# Patient Record
Sex: Female | Born: 1955
Health system: Southern US, Community
[De-identification: ages and names within clinical notes are randomized; demographics above are authoritative.]

## PROBLEM LIST (undated history)

## (undated) DIAGNOSIS — D531 Other megaloblastic anemias, not elsewhere classified: Secondary | ICD-10-CM

## (undated) DIAGNOSIS — E079 Disorder of thyroid, unspecified: Secondary | ICD-10-CM

## (undated) DIAGNOSIS — M109 Gout, unspecified: Secondary | ICD-10-CM

## (undated) DIAGNOSIS — I8390 Asymptomatic varicose veins of unspecified lower extremity: Secondary | ICD-10-CM

## (undated) DIAGNOSIS — E785 Hyperlipidemia, unspecified: Secondary | ICD-10-CM

## (undated) DIAGNOSIS — R112 Nausea with vomiting, unspecified: Secondary | ICD-10-CM

## (undated) DIAGNOSIS — I10 Essential (primary) hypertension: Secondary | ICD-10-CM

## (undated) DIAGNOSIS — E039 Hypothyroidism, unspecified: Secondary | ICD-10-CM

## (undated) DIAGNOSIS — N189 Chronic kidney disease, unspecified: Secondary | ICD-10-CM

## (undated) DIAGNOSIS — Z9889 Other specified postprocedural states: Secondary | ICD-10-CM

## (undated) DIAGNOSIS — E669 Obesity, unspecified: Secondary | ICD-10-CM

## (undated) HISTORY — PX: OTHER SURGICAL HISTORY: SHX169

## (undated) HISTORY — PX: TONSILLECTOMY: SUR1361

## (undated) HISTORY — DX: Essential (primary) hypertension: I10

## (undated) HISTORY — DX: Gout, unspecified: M10.9

## (undated) HISTORY — DX: Disorder of thyroid, unspecified: E07.9

## (undated) HISTORY — PX: ABLATION ON ENDOMETRIOSIS: SHX5787

## (undated) HISTORY — DX: Asymptomatic varicose veins of unspecified lower extremity: I83.90

## (undated) HISTORY — DX: Obesity, unspecified: E66.9

## (undated) HISTORY — DX: Other megaloblastic anemias, not elsewhere classified: D53.1

## (undated) HISTORY — DX: Hyperlipidemia, unspecified: E78.5

## (undated) HISTORY — PX: FOOT SURGERY: SHX648

## (undated) HISTORY — DX: Chronic kidney disease, unspecified: N18.9

---

## 2000-04-01 ENCOUNTER — Encounter: Payer: Self-pay | Admitting: Gynecology

## 2000-04-01 ENCOUNTER — Encounter: Admission: RE | Admit: 2000-04-01 | Discharge: 2000-04-01 | Payer: Self-pay | Admitting: Gynecology

## 2001-03-31 ENCOUNTER — Other Ambulatory Visit: Admission: RE | Admit: 2001-03-31 | Discharge: 2001-03-31 | Payer: Self-pay | Admitting: Gynecology

## 2001-04-07 ENCOUNTER — Encounter: Payer: Self-pay | Admitting: Gynecology

## 2001-04-07 ENCOUNTER — Encounter: Admission: RE | Admit: 2001-04-07 | Discharge: 2001-04-07 | Payer: Self-pay | Admitting: Gynecology

## 2002-04-02 ENCOUNTER — Other Ambulatory Visit: Admission: RE | Admit: 2002-04-02 | Discharge: 2002-04-02 | Payer: Self-pay | Admitting: Gynecology

## 2002-04-13 ENCOUNTER — Encounter: Admission: RE | Admit: 2002-04-13 | Discharge: 2002-04-13 | Payer: Self-pay | Admitting: Gynecology

## 2002-04-13 ENCOUNTER — Encounter: Payer: Self-pay | Admitting: Gynecology

## 2003-04-06 ENCOUNTER — Other Ambulatory Visit: Admission: RE | Admit: 2003-04-06 | Discharge: 2003-04-06 | Payer: Self-pay | Admitting: Gynecology

## 2003-04-27 ENCOUNTER — Encounter: Admission: RE | Admit: 2003-04-27 | Discharge: 2003-04-27 | Payer: Self-pay | Admitting: Gynecology

## 2003-04-27 ENCOUNTER — Encounter: Payer: Self-pay | Admitting: Gynecology

## 2004-01-26 ENCOUNTER — Encounter: Admission: RE | Admit: 2004-01-26 | Discharge: 2004-01-26 | Payer: Self-pay | Admitting: Internal Medicine

## 2004-05-16 ENCOUNTER — Encounter: Admission: RE | Admit: 2004-05-16 | Discharge: 2004-05-16 | Payer: Self-pay | Admitting: Gynecology

## 2004-07-31 ENCOUNTER — Other Ambulatory Visit: Admission: RE | Admit: 2004-07-31 | Discharge: 2004-07-31 | Payer: Self-pay | Admitting: Gynecology

## 2005-12-18 ENCOUNTER — Encounter: Admission: RE | Admit: 2005-12-18 | Discharge: 2005-12-18 | Payer: Self-pay | Admitting: Gynecology

## 2006-12-20 ENCOUNTER — Encounter: Admission: RE | Admit: 2006-12-20 | Discharge: 2006-12-20 | Payer: Self-pay | Admitting: Obstetrics and Gynecology

## 2007-12-22 ENCOUNTER — Encounter: Admission: RE | Admit: 2007-12-22 | Discharge: 2007-12-22 | Payer: Self-pay | Admitting: Obstetrics and Gynecology

## 2007-12-31 ENCOUNTER — Encounter: Admission: RE | Admit: 2007-12-31 | Discharge: 2007-12-31 | Payer: Self-pay | Admitting: Obstetrics and Gynecology

## 2008-06-15 ENCOUNTER — Encounter: Admission: RE | Admit: 2008-06-15 | Discharge: 2008-06-15 | Payer: Self-pay | Admitting: Obstetrics and Gynecology

## 2008-06-25 ENCOUNTER — Ambulatory Visit (HOSPITAL_COMMUNITY): Admission: RE | Admit: 2008-06-25 | Discharge: 2008-06-25 | Payer: Self-pay | Admitting: Obstetrics and Gynecology

## 2008-12-31 ENCOUNTER — Encounter: Admission: RE | Admit: 2008-12-31 | Discharge: 2008-12-31 | Payer: Self-pay | Admitting: Obstetrics and Gynecology

## 2010-01-02 ENCOUNTER — Encounter: Admission: RE | Admit: 2010-01-02 | Discharge: 2010-01-02 | Payer: Self-pay | Admitting: Obstetrics and Gynecology

## 2010-11-27 ENCOUNTER — Other Ambulatory Visit: Payer: Self-pay | Admitting: Obstetrics and Gynecology

## 2010-11-27 DIAGNOSIS — Z1231 Encounter for screening mammogram for malignant neoplasm of breast: Secondary | ICD-10-CM

## 2011-01-04 ENCOUNTER — Ambulatory Visit
Admission: RE | Admit: 2011-01-04 | Discharge: 2011-01-04 | Disposition: A | Discharge status: Home / Self Care | Payer: Self-pay | Source: Ambulatory Visit | Attending: Obstetrics and Gynecology | Admitting: Obstetrics and Gynecology

## 2011-01-04 DIAGNOSIS — Z1231 Encounter for screening mammogram for malignant neoplasm of breast: Secondary | ICD-10-CM

## 2011-02-20 NOTE — H&P (Signed)
NAME:  Karen Mills, Karen Mills NO.:  [id number]   MEDICAL RECORD NO.:  [id number]          PATIENT TYPE:  AMB   LOCATION:  [place]                           FACILITY:  [facility]   PHYSICIAN:  Paula Compton, M.D. DATE OF BIRTH:  [birth year]   DATE OF ADMISSION:  DATE OF DISCHARGE:                              HISTORY & PHYSICAL   The patient is a 55 year old G1, P1 who is coming in for a scheduled  hysteroscopy Novasure endometrial ablation, given an ongoing problem  with abnormal uterine bleeding and menorrhagia.  The patient has had  significant heavy periods over the last 6-8 months, at times of  bleeding, heavy for 14 days straight.  She has had an endometrial biopsy  performed which was normal in August of 2009 and a saline infusion  ultrasound which demonstrated a normal uterine cavity with an intramural  fibroids noted.  She was given many options to deal with the bleeding  and elects to have a Novasure endometrial ablation, as she is surely  close to menopause and feels this would be the best way to deal with her  problem.   PAST MEDICAL HISTORY:  Chronic hypertension.   PAST SURGICAL HISTORY:  Foot surgery.   PAST OBSTETRICAL HISTORY:  One vaginal delivery.   PAST GYNECOLOGIC HISTORY:  No abnormal Pap smears.   FAMILY HISTORY:  She has no breast cancer or colon cancer.   CURRENT MEDICATIONS:  1. Levoxyl thyroid.  2. Benicar.  3. Ziac.  4. Lasix.   ALLERGIES:  No known drug allergies.   PHYSICAL EXAMINATION:  VITAL SIGNS:  Her blood pressure is 130/89.  CARDIAC:  Regular rate and rhythm.  LUNGS:  Clear.  ABDOMEN:  Soft and nontender.  PELVIC:  Normal external genitalia noted.  Cervix has no lesions.  Uterus is normal in size and on ultrasound demonstrates a small fibroid  approximately 2-3 cm  The ovaries are also normal, and the uterine  cavity itself is normal on ultrasound.  The patient was counseled as to  her possible options and was given options of  medical therapy, Lenda Kelp  and a Novasure ablation.  Given and her heavy cycles, she wishes to  proceed with the Novasure  ablation.  We discussed the risks and benefits in detail including  bleeding and uterine perforation.  She understands these risks.  She  will also use Cytotec prior to the procedure to decrease the risk of  perforation, and she wishes to proceed as stated.      Paula Compton, M.D.  Electronically Signed     KR/MEDQ  D:  06/24/2008  T:  06/24/2008  Job:  [id number]

## 2011-02-20 NOTE — Op Note (Signed)
NAME:  Karen Mills, Karen Mills NO.:  [id number]   MEDICAL RECORD NO.:  [id number]          PATIENT TYPE:  AMB   LOCATION:  [facility]                           FACILITY:  [facility]   PHYSICIAN:  Paula Compton, M.D. DATE OF BIRTH:  [birth year]   DATE OF PROCEDURE:  DATE OF DISCHARGE:                               OPERATIVE REPORT   PREOPERATIVE DIAGNOSES:  1. Menorrhagia.  2. Small fibroid uterus.   POSTOPERATIVE DIAGNOSES:  1. Menorrhagia.  2. Small fibroid uterus.   PROCEDURES:  Hysteroscopy and NovaSure ablation.   SURGEON:  Paula Compton, MD   ANESTHESIA:  [facility] with a 1% paracervical lidocaine block.   There is a normal cavity noted with no defects or polyps or fibroids.   ESTIMATED BLOOD LOSS:  Minimal.   URINE OUTPUT:  50 mL straight cath prior to procedure.   IV FLUIDS:  1200 LR.   HYSTEROSCOPIC DEFICIT:  100 mL.   COMPLICATIONS:  None.   PROCEDURE IN DETAIL:  The patient was taken to the operating room, where  [facility] anesthesia was obtained without difficulty.  She was then prepped  and draped in normal sterile fashion in the dorsal lithotomy position.  A speculum was placed within the patient's vagina, the cervix was  grasped with single-tooth tenaculum, and a paracervical block was placed  at 2 and 10 o'clock with 10 mL of 1% lidocaine placed at each location.  The uterus sounded to approximately 9 cm, the cervix was 4-cm long, and  the hysteroscope was then introduced into the cavity.  It was inspected  with a normal cavity shape, sound, and no intracavitary polyps or  fibroids noted.  At this point, the hysteroscope was removed, and the  NovaSure unit placed within the uterine cavity with a cavity depth of 5  set.  It was opened to a cavity width of 4 and test phase performed and  passed.  A treatment cycle of 1 minute 37 seconds then ensued and the  patient did quite well.  The NovaSure was then removed and the  hysteroscope reintroduced, and  a good  blanching noted throughout the cavity with no viable endometrium  visible.  At this point, all instruments and sponges were removed from  the vagina.  The tenaculum site was inspected and found to be  hemostatic.  Therefore, the patient was awakened and taken to the  recovery room in stable condition.      Paula Compton, M.D.  Electronically Signed     KR/MEDQ  D:  06/25/2008  T:  06/26/2008  Job:  [id number]

## 2011-07-09 LAB — CBC
MCHC: 34.1
Platelets: 340
RDW: 13.4

## 2011-07-09 LAB — BASIC METABOLIC PANEL
BUN: 10
CO2: 28
Chloride: 99
Creatinine, Ser: 0.72
Potassium: 3.7

## 2011-11-26 ENCOUNTER — Other Ambulatory Visit: Payer: Self-pay | Admitting: Obstetrics and Gynecology

## 2011-11-26 DIAGNOSIS — Z1231 Encounter for screening mammogram for malignant neoplasm of breast: Secondary | ICD-10-CM

## 2012-01-07 ENCOUNTER — Ambulatory Visit
Admission: RE | Admit: 2012-01-07 | Discharge: 2012-01-07 | Disposition: A | Discharge status: Home / Self Care | Payer: 59 | Source: Ambulatory Visit | Attending: Obstetrics and Gynecology | Admitting: Obstetrics and Gynecology

## 2012-01-07 DIAGNOSIS — Z1231 Encounter for screening mammogram for malignant neoplasm of breast: Secondary | ICD-10-CM

## 2012-12-02 ENCOUNTER — Other Ambulatory Visit: Payer: Self-pay | Admitting: Obstetrics and Gynecology

## 2012-12-02 DIAGNOSIS — Z1231 Encounter for screening mammogram for malignant neoplasm of breast: Secondary | ICD-10-CM

## 2013-01-07 ENCOUNTER — Ambulatory Visit
Admission: RE | Admit: 2013-01-07 | Discharge: 2013-01-07 | Disposition: A | Discharge status: Home / Self Care | Payer: 59 | Source: Ambulatory Visit | Attending: Obstetrics and Gynecology | Admitting: Obstetrics and Gynecology

## 2013-01-07 ENCOUNTER — Other Ambulatory Visit: Payer: Self-pay | Admitting: Obstetrics and Gynecology

## 2013-01-07 DIAGNOSIS — R928 Other abnormal and inconclusive findings on diagnostic imaging of breast: Secondary | ICD-10-CM

## 2013-01-07 DIAGNOSIS — Z1231 Encounter for screening mammogram for malignant neoplasm of breast: Secondary | ICD-10-CM

## 2013-01-20 ENCOUNTER — Ambulatory Visit
Admission: RE | Admit: 2013-01-20 | Discharge: 2013-01-20 | Disposition: A | Discharge status: Home / Self Care | Payer: 59 | Source: Ambulatory Visit | Attending: Obstetrics and Gynecology | Admitting: Obstetrics and Gynecology

## 2013-01-20 DIAGNOSIS — R928 Other abnormal and inconclusive findings on diagnostic imaging of breast: Secondary | ICD-10-CM

## 2013-12-14 ENCOUNTER — Other Ambulatory Visit: Payer: Self-pay | Admitting: Obstetrics and Gynecology

## 2013-12-14 ENCOUNTER — Other Ambulatory Visit: Payer: Self-pay

## 2013-12-14 DIAGNOSIS — Z1231 Encounter for screening mammogram for malignant neoplasm of breast: Secondary | ICD-10-CM

## 2013-12-14 DIAGNOSIS — Z780 Asymptomatic menopausal state: Secondary | ICD-10-CM

## 2013-12-15 ENCOUNTER — Encounter: Payer: Self-pay | Admitting: Vascular Surgery

## 2013-12-15 ENCOUNTER — Other Ambulatory Visit: Payer: Self-pay | Admitting: *Deleted

## 2013-12-15 DIAGNOSIS — I83893 Varicose veins of bilateral lower extremities with other complications: Secondary | ICD-10-CM

## 2013-12-18 ENCOUNTER — Encounter: Payer: Self-pay | Admitting: Vascular Surgery

## 2013-12-21 ENCOUNTER — Ambulatory Visit (INDEPENDENT_AMBULATORY_CARE_PROVIDER_SITE_OTHER): Payer: 59 | Admitting: Vascular Surgery

## 2013-12-21 ENCOUNTER — Encounter: Payer: Self-pay | Admitting: Vascular Surgery

## 2013-12-21 ENCOUNTER — Ambulatory Visit (HOSPITAL_COMMUNITY)
Admission: RE | Admit: 2013-12-21 | Discharge: 2013-12-21 | Disposition: A | Discharge status: Home / Self Care | Payer: 59 | Source: Ambulatory Visit | Attending: Vascular Surgery | Admitting: Vascular Surgery

## 2013-12-21 VITALS — BP 198/85 | HR 56 | Resp 18 | Ht 68.0 in | Wt 226.0 lb

## 2013-12-21 DIAGNOSIS — I83893 Varicose veins of bilateral lower extremities with other complications: Secondary | ICD-10-CM

## 2013-12-21 NOTE — Progress Notes (Signed)
Subjective:     Patient ID: Karen Mills, female   DOB: 03-31-1956, 58 y.o.   MRN: [id number]  HPI this 58 year old female referred by Dr. Harrington Challenger because of recurrent bleeding from reticular veins in the right lower extremity. This patient has had prominent bluish discoloration particularly in the lateral right lower leg area proximal to the ankle and she has had 2 recent episodes of spontaneous bleeding from this area which required compression to resolve. She also has had some painful varicosities in the right medial calf and thigh area which slowly enlarged. She does not wear elastic compression stockings nor elevate her legs or regular basis she has noticed some mild edema in the right ankle recently. She has no history of DVT or superficial thrombophlebitis.  Past Medical History  Diagnosis Date  . Varicose veins   . Hypertension   . Thyroid disease   . Hyperlipidemia     History  Substance Use Topics  . Smoking status: Never Smoker   . Smokeless tobacco: Never Used  . Alcohol Use: Yes    No family history on file.  Allergies  Allergen Reactions  . Accupril [Quinapril Hcl]     cough  . Amlodipine Besylate     swelling  . Atacand [Candesartan]   . Cozaar [Losartan Potassium]     Stomach upset   . Maxzide [Hydrochlorothiazide W-Triamterene]   . Tiazac [Diltiazem Hcl Er]   . Zestril [Lisinopril]     Cough     Current outpatient prescriptions:Biotin (BIOTIN 5000) 5 MG CAPS, Take by mouth daily. 2 capsules once a day., Disp: , Rfl: ;  Cyanocobalamin (VITAMIN B 12 PO), Take 100 mcg by mouth daily., Disp: , Rfl: ;  furosemide (LASIX) 20 MG tablet, Take 20 mg by mouth daily., Disp: , Rfl: ;  levothyroxine (SYNTHROID, LEVOTHROID) 125 MCG tablet, Take 125 mcg by mouth daily before breakfast., Disp: , Rfl:  olmesartan (BENICAR) 40 MG tablet, Take 40 mg by mouth daily., Disp: , Rfl: ;  Omega-3 Fatty Acids (FISH OIL BURP-LESS) 1200 MG CAPS, Take by mouth 3 (three) times daily., Disp:  , Rfl: ;  Plant Sterol Stanol-Pantethine (CHOLEST OFF COMPLETE) 450-75 MG TABS, Take by mouth., Disp: , Rfl: ;  vitamin C (ASCORBIC ACID) 500 MG tablet, Take 500 mg by mouth daily. 2 tablets daily., Disp: , Rfl:  vitamin E 400 UNIT capsule, Take 400 Units by mouth 2 (two) times daily., Disp: , Rfl: ;  zinc gluconate 50 MG tablet, Take 50 mg by mouth daily., Disp: , Rfl:   BP 198/85  Pulse 56  Resp 18  Ht 5\' 8"  (1.727 m)  Wt 226 lb (102.513 kg)  BMI 34.37 kg/m2  Body mass index is 34.37 kg/(m^2).          Review of Systems denies chest pain, dyspnea on exertion, PND, orthopnea, wheezing, hemoptysis, claudication. Does have a history of some bleeding in the past-all other systems negative and a complete review of systems     Objective:   Physical Exam BP 198/85  Pulse 56  Resp 18  Ht 5\' 8"  (1.727 m)  Wt 226 lb (102.513 kg)  BMI 34.37 kg/m2  Gen.-alert and oriented x3 in no apparent distress HEENT normal for age Lungs no rhonchi or wheezing Cardiovascular regular rhythm no murmurs carotid pulses 3+ palpable no bruits audible Abdomen soft nontender no palpable masses Musculoskeletal free of  major deformities Skin clear -no rashes Neurologic normal Lower extremities 3+ femoral and dorsalis pedis  pulses palpable bilaterally with no edema  Left leg 1+ edema right ankle Right leg has bulging varicosities in the distal medial thigh and medial calf extending around to the knee. Also has a 4 x 4 centimeter patch of reticular and spider veins proximal to the right lateral malleolus. There is no active bleeding or ulceration at this point but this is the location where the bleeding occurred on 2 occasions. No hyperpigmentation.  Today I ordered a venous duplex exam of the right leg which are reviewed and interpreted. There is incompetence of the valves in the right deep venous system throughout but no superficial reflux in the saphenous system is noted and there is no DVT      Assessment:     Recurrent bleeding from superficial veins-reticular-right lower extremity with bulging painful varicosities and lower thigh and calf with deep venous reflux    Plan:     Patient needs stab phlebectomy of varicosities in the distal thigh and calf right leg and sclerotherapy at a bleeding site of reticular veins because of recurrent bleeding from this site Will proceed with precertification to perform this in the very near future

## 2014-01-07 ENCOUNTER — Encounter: Payer: 59 | Admitting: Vascular Surgery

## 2014-01-07 ENCOUNTER — Encounter (HOSPITAL_COMMUNITY): Payer: 59

## 2014-01-08 ENCOUNTER — Ambulatory Visit: Payer: 59

## 2014-01-11 ENCOUNTER — Other Ambulatory Visit: Payer: Self-pay | Admitting: Family Medicine

## 2014-01-11 DIAGNOSIS — Z780 Asymptomatic menopausal state: Secondary | ICD-10-CM

## 2014-01-14 ENCOUNTER — Ambulatory Visit
Admission: RE | Admit: 2014-01-14 | Discharge: 2014-01-14 | Disposition: A | Discharge status: Home / Self Care | Payer: 59 | Source: Ambulatory Visit

## 2014-01-14 ENCOUNTER — Ambulatory Visit
Admission: RE | Admit: 2014-01-14 | Discharge: 2014-01-14 | Disposition: A | Discharge status: Home / Self Care | Payer: 59 | Source: Ambulatory Visit | Attending: Obstetrics and Gynecology | Admitting: Obstetrics and Gynecology

## 2014-01-14 DIAGNOSIS — Z780 Asymptomatic menopausal state: Secondary | ICD-10-CM

## 2014-01-14 DIAGNOSIS — Z1231 Encounter for screening mammogram for malignant neoplasm of breast: Secondary | ICD-10-CM

## 2014-02-01 ENCOUNTER — Other Ambulatory Visit: Payer: 59 | Admitting: Vascular Surgery

## 2014-02-12 ENCOUNTER — Encounter: Payer: Self-pay | Admitting: Vascular Surgery

## 2014-02-15 ENCOUNTER — Encounter: Payer: Self-pay | Admitting: Vascular Surgery

## 2014-02-15 ENCOUNTER — Other Ambulatory Visit: Payer: 59 | Admitting: Vascular Surgery

## 2014-02-15 ENCOUNTER — Ambulatory Visit (INDEPENDENT_AMBULATORY_CARE_PROVIDER_SITE_OTHER): Payer: 59 | Admitting: Vascular Surgery

## 2014-02-15 VITALS — BP 205/111 | HR 63 | Resp 16 | Ht 68.5 in | Wt 220.0 lb

## 2014-02-15 DIAGNOSIS — I83893 Varicose veins of bilateral lower extremities with other complications: Secondary | ICD-10-CM

## 2014-02-15 NOTE — Progress Notes (Signed)
   Laser Ablation Procedure      Date: 02/15/2014    SHANELLE MAKOWSKI DOB:05/28/1956  Consent signed: Yes  Surgeon:J.D. Kellie Simmering  Procedure:Stab Phlebectomies and Sclerotherapyright leg  BP 205/111  Pulse 63  Resp 16  Ht 5' 8.5" (1.74 m)  Wt 220 lb (99.791 kg)  BMI 32.96 kg/m2  Start time: 1:00   End time: 2:25  Tumescent Anesthesia: 100 cc 0.9% NaCl with 50 cc Lidocaine HCL with 1% Epi and 15 cc 8.4% NaHCO3  Local Anesthesia: 3 cc Lidocaine HCL and NaHCO3 (ratio 2:1)     Sclerotherapy: .3% %Sotradecol. Patient received a total of 12 cc foam with CO2  Stab Phlebectomy: 10-20 Sites: Thigh and Calf  Patient tolerated procedure well: Yes  Notes:   Description of Procedure:    The patient was  put into Trendelenburg position.  Local anesthetic was utilized overlying the marked varicosities.  Ten to 20stab wounds were made using the tip of an 11 blade; and using the vein hook,  The phlebectomies were performed using a hemostat to avulse these varicosities.  Adequate hemostasis was achieved, and steri strips were applied to the stab wound.    Sclerotherapy was performed to spider veins using 12  cc .3% Sotradecol foam via a 27g butterfly needle.  ABD pads and thigh high compression stockings were applied.  Ace wrap bandages were applied over the phlebectomy sites..  Blood loss was less than 15 cc.  The patient ambulated out of the operating room having tolerated the procedure well.

## 2014-02-15 NOTE — Progress Notes (Signed)
Subjective:     Patient ID: Karen Mills, female   DOB: [birth year], 58 y.o.   MRN: [id number]  HPI this 58 year old female had multiple stab phlebectomy of painful varicosities in the right medial thigh and medial calf area performed under local tumescent anesthesia. She tolerated the procedure well. She also had sclerotherapy performed in areas the lower portion of the leg were bleeding his previously occurred.   Review of Systems     Objective:   Physical Exam BP 205/111  Pulse 63  Resp 16  Ht 5' 8.5" (1.74 m)  Wt 220 lb (99.791 kg)  BMI 32.96 kg/m2       Assessment:     Well-tolerated stab phlebectomy multiple varicosities right leg and sclerotherapy    Plan:     Return in 8 weeks for followup

## 2014-02-16 ENCOUNTER — Encounter: Payer: Self-pay | Admitting: Vascular Surgery

## 2014-02-16 ENCOUNTER — Telehealth: Payer: Self-pay | Admitting: *Deleted

## 2014-02-16 NOTE — Telephone Encounter (Signed)
Asked pt to call me if she is having any problems, questions or concerns.

## 2014-04-12 ENCOUNTER — Encounter: Payer: Self-pay | Admitting: Vascular Surgery

## 2014-04-13 ENCOUNTER — Encounter: Payer: Self-pay | Admitting: Vascular Surgery

## 2014-04-13 ENCOUNTER — Ambulatory Visit (INDEPENDENT_AMBULATORY_CARE_PROVIDER_SITE_OTHER): Payer: Self-pay | Admitting: Vascular Surgery

## 2014-04-13 ENCOUNTER — Encounter (HOSPITAL_COMMUNITY): Payer: 59

## 2014-04-13 VITALS — BP 184/81 | HR 64 | Resp 16 | Ht 68.0 in | Wt 210.0 lb

## 2014-04-13 DIAGNOSIS — I83893 Varicose veins of bilateral lower extremities with other complications: Secondary | ICD-10-CM

## 2014-04-13 NOTE — Progress Notes (Signed)
Subjective:     Patient ID: KURSTIN KOTLARZ, female   DOB: 05/10/1956, 58 y.o.   MRN: [id number]  HPI this 58 year old female returns for a week followup regarding multiple stab phlebectomy of painful varicosities in the right leg. She also had some sclerotherapy performed. In general she is very pleased with the result. She does have some mild chronic edema. She is no longer elastic compression stockings.   Review of Systems     Objective:   Physical Exam BP 184/81  Pulse 64  Resp 16  Ht 5\' 8"  (1.727 m)  Wt 210 lb (95.255 kg)  BMI 31.94 kg/m2   General well-developed well-nourished female no apparent stress alert oriented x3 Right leg with no obvious bulging varicosities remaining. Minimal distal edema with 3 posterior cells pedis pulse. There are spider veins in clusters from the medial thigh and calf area.     Assessment:     Good result following multiple stab phlebectomy of painful varicosities    Plan:     Patient may consider further sclerotherapy in the future otherwise return on when necessary basis

## 2014-12-13 ENCOUNTER — Other Ambulatory Visit: Payer: Self-pay

## 2014-12-13 DIAGNOSIS — Z1231 Encounter for screening mammogram for malignant neoplasm of breast: Secondary | ICD-10-CM

## 2015-01-17 ENCOUNTER — Encounter (INDEPENDENT_AMBULATORY_CARE_PROVIDER_SITE_OTHER): Payer: Self-pay

## 2015-01-17 ENCOUNTER — Ambulatory Visit
Admission: RE | Admit: 2015-01-17 | Discharge: 2015-01-17 | Disposition: A | Discharge status: Home / Self Care | Payer: 59 | Source: Ambulatory Visit

## 2015-01-17 DIAGNOSIS — Z1231 Encounter for screening mammogram for malignant neoplasm of breast: Secondary | ICD-10-CM

## 2015-04-03 ENCOUNTER — Emergency Department (HOSPITAL_BASED_OUTPATIENT_CLINIC_OR_DEPARTMENT_OTHER)
Admission: EM | Admit: 2015-04-03 | Discharge: 2015-04-03 | Disposition: A | Discharge status: Home / Self Care | Payer: 59 | Attending: Emergency Medicine | Admitting: Emergency Medicine

## 2015-04-03 ENCOUNTER — Encounter (HOSPITAL_BASED_OUTPATIENT_CLINIC_OR_DEPARTMENT_OTHER): Payer: Self-pay | Admitting: Emergency Medicine

## 2015-04-03 ENCOUNTER — Emergency Department (HOSPITAL_BASED_OUTPATIENT_CLINIC_OR_DEPARTMENT_OTHER): Payer: 59

## 2015-04-03 DIAGNOSIS — S9002XA Contusion of left ankle, initial encounter: Secondary | ICD-10-CM | POA: Diagnosis not present

## 2015-04-03 DIAGNOSIS — Y9389 Activity, other specified: Secondary | ICD-10-CM | POA: Diagnosis not present

## 2015-04-03 DIAGNOSIS — I10 Essential (primary) hypertension: Secondary | ICD-10-CM | POA: Insufficient documentation

## 2015-04-03 DIAGNOSIS — E785 Hyperlipidemia, unspecified: Secondary | ICD-10-CM | POA: Insufficient documentation

## 2015-04-03 DIAGNOSIS — E079 Disorder of thyroid, unspecified: Secondary | ICD-10-CM | POA: Diagnosis not present

## 2015-04-03 DIAGNOSIS — Y9289 Other specified places as the place of occurrence of the external cause: Secondary | ICD-10-CM | POA: Diagnosis not present

## 2015-04-03 DIAGNOSIS — W010XXA Fall on same level from slipping, tripping and stumbling without subsequent striking against object, initial encounter: Secondary | ICD-10-CM | POA: Diagnosis not present

## 2015-04-03 DIAGNOSIS — Y998 Other external cause status: Secondary | ICD-10-CM | POA: Insufficient documentation

## 2015-04-03 DIAGNOSIS — S8012XA Contusion of left lower leg, initial encounter: Secondary | ICD-10-CM

## 2015-04-03 DIAGNOSIS — Z79899 Other long term (current) drug therapy: Secondary | ICD-10-CM | POA: Diagnosis not present

## 2015-04-03 DIAGNOSIS — S8992XA Unspecified injury of left lower leg, initial encounter: Secondary | ICD-10-CM | POA: Diagnosis present

## 2015-04-03 DIAGNOSIS — W19XXXA Unspecified fall, initial encounter: Secondary | ICD-10-CM

## 2015-04-03 NOTE — Discharge Instructions (Signed)
Apply cool compresses for the next 24-48 hour then transitioned to warm compresses.  Please follow with your primary care doctor in the next 2 days for a check-up. They must obtain records for further management.   Do not hesitate to return to the Emergency Department for any new, worsening or concerning symptoms.    Contusion A contusion is a deep bruise. Contusions are the result of an injury that caused bleeding under the skin. The contusion may turn blue, purple, or yellow. Minor injuries will give you a painless contusion, but more severe contusions may stay painful and swollen for a few weeks.  CAUSES  A contusion is usually caused by a blow, trauma, or direct force to an area of the body. SYMPTOMS   Swelling and redness of the injured area.  Bruising of the injured area.  Tenderness and soreness of the injured area.  Pain. DIAGNOSIS  The diagnosis can be made by taking a history and physical exam. An X-ray, CT scan, or MRI may be needed to determine if there were any associated injuries, such as fractures. TREATMENT  Specific treatment will depend on what area of the body was injured. In general, the best treatment for a contusion is resting, icing, elevating, and applying cold compresses to the injured area. Over-the-counter medicines may also be recommended for pain control. Ask your caregiver what the best treatment is for your contusion. HOME CARE INSTRUCTIONS   Put ice on the injured area.  Put ice in a plastic bag.  Place a towel between your skin and the bag.  Leave the ice on for 15-20 minutes, 3-4 times a day, or as directed by your health care provider.  Only take over-the-counter or prescription medicines for pain, discomfort, or fever as directed by your caregiver. Your caregiver may recommend avoiding anti-inflammatory medicines (aspirin, ibuprofen, and naproxen) for 48 hours because these medicines may increase bruising.  Rest the injured area.  If possible,  elevate the injured area to reduce swelling. SEEK IMMEDIATE MEDICAL CARE IF:   You have increased bruising or swelling.  You have pain that is getting worse.  Your swelling or pain is not relieved with medicines. MAKE SURE YOU:   Understand these instructions.  Will watch your condition.  Will get help right away if you are not doing well or get worse. Document Released: 07/04/2005 Document Revised: 09/29/2013 Document Reviewed: 07/30/2011 [facility] Patient Information 2015 [place], [place]. This information is not intended to replace advice given to you by your health care provider. Make sure you discuss any questions you have with your health care provider.

## 2015-04-03 NOTE — ED Notes (Signed)
Pt in c/o swelling and bruising to L leg after tripping on a storm drain x 2 days ago, ambulatory to triage.

## 2015-04-03 NOTE — ED Provider Notes (Signed)
CSN: [id number]     Arrival date & time 04/03/15  1216 History   First MD Initiated Contact with Patient 04/03/15 1305     Chief Complaint  Patient presents with  . Leg Injury     (Consider location/radiation/quality/duration/timing/severity/associated sxs/prior Treatment) HPI   Blood pressure 182/78, pulse 68, temperature 98.2 F (36.8 C), temperature source Oral, resp. rate 18, SpO2 98 %.  Karen Mills is a 59 y.o. female complaining of contusion to the lateral left leg and ecchymoses to medial left ankle status post slip and fall 2 days ago. Patient tripped into storm drain when she was carrying groceries to her car. She has been ambulatory at home without issue. She took Advil on the day of the fall but has not taken any since and she rates her pain is minimal and describes as a stinking. There was no other trauma, no head trauma, cervicalgia, LOC, chest pain, abdominal pain, difficulty moving being major joints numbness or weakness.   Past Medical History  Diagnosis Date  . Varicose veins   . Hypertension   . Thyroid disease   . Hyperlipidemia    Past Surgical History  Procedure Laterality Date  . Foot surgery    . Tonsillectomy    . Ablation on endometriosis    . Vein removal     Family History  Problem Relation Age of Onset  . Heart disease Mother   . Heart disease Father   . Heart disease Brother    History  Substance Use Topics  . Smoking status: Never Smoker   . Smokeless tobacco: Never Used  . Alcohol Use: Yes   OB History    No data available     Review of Systems  10 systems reviewed and found to be negative, except as noted in the HPI.   Allergies  Accupril; Amlodipine besylate; Atacand; Cozaar; Maxzide; Tiazac; and Zestril  Home Medications   Prior to Admission medications   Medication Sig Start Date End Date Taking? Authorizing Provider  Biotin (BIOTIN 5000) 5 MG CAPS Take by mouth daily. 2 capsules once a day.    Historical Provider, MD   Cyanocobalamin (VITAMIN B 12 PO) Take 100 mcg by mouth daily.    Historical Provider, MD  furosemide (LASIX) 20 MG tablet Take 20 mg by mouth daily.    Historical Provider, MD  levothyroxine (SYNTHROID, LEVOTHROID) 125 MCG tablet Take 125 mcg by mouth daily before breakfast.    Historical Provider, MD  olmesartan (BENICAR) 40 MG tablet Take 40 mg by mouth daily.    Historical Provider, MD  Omega-3 Fatty Acids (FISH OIL BURP-LESS) 1200 MG CAPS Take by mouth 3 (three) times daily.    Historical Provider, MD  vitamin C (ASCORBIC ACID) 500 MG tablet Take 500 mg by mouth daily. 2 tablets daily.    Historical Provider, MD  vitamin E 400 UNIT capsule Take 400 Units by mouth 2 (two) times daily.    Historical Provider, MD  zinc gluconate 50 MG tablet Take 50 mg by mouth daily.    Historical Provider, MD   BP 182/78 mmHg  Pulse 68  Temp(Src) 98.2 F (36.8 C) (Oral)  Resp 18  SpO2 98% Physical Exam  Constitutional: She is oriented to person, place, and time. She appears well-developed and well-nourished. No distress.  HENT:  Head: Normocephalic.  Eyes: Conjunctivae and EOM are normal.  Cardiovascular: Normal rate and regular rhythm.   Pulmonary/Chest: Effort normal and breath sounds normal. No stridor.  Abdominal: Bowel sounds are normal.  Musculoskeletal: Normal range of motion. She exhibits edema and tenderness.  10 cm contusion to left lateral mid shin. Patient has ecchymoses to the inferior lateral left medial malleolus, dorsalis pedis is 2+, no tenderness to palpation along the bilateral malleoli. No overlying lacerations or abrasions, she does have a 1 cm fluid fluid-filled blister overlying the contusion.  No superficial collaterals, Homans sign is negative.  Neurological: She is alert and oriented to person, place, and time.  Psychiatric: She has a normal mood and affect.  Nursing note and vitals reviewed.   ED Course  Procedures (including critical care time) Labs Review Labs  Reviewed - No data to display  Imaging Review Dg Tibia/fibula Left  04/03/2015   CLINICAL DATA:  Pain, bruising, and swelling secondary to a fall 2 days ago.  EXAM: LEFT TIBIA AND FIBULA - 2 VIEW  COMPARISON:  None.  FINDINGS: There is no evidence of fracture or other focal bone lesions. Edema in the subcutaneous tissues at the anterior lateral aspect of the left lower leg.  IMPRESSION: Soft tissue contusion.  No acute osseous abnormality.   Electronically Signed   By: Lorriane Shire M.D.   On: 04/03/2015 12:54     EKG Interpretation None      MDM   Final diagnoses:  Contusion of leg, left, initial encounter  Fall, initial encounter    Filed Vitals:   04/03/15 1235  BP: 182/78  Pulse: 68  Temp: 98.2 F (36.8 C)  TempSrc: Oral  Resp: 18  SpO2: 98%    Karen Mills is a pleasant 59 y.o. female presenting with large contusion to lateral shin and ecchymoses to medial malleolus. No tenderness to palpation along the [place]. Patient is ambulatory with a nonantalgic gait. Advised patient to apply cold and then transition to warm compresses. Gave anticipatory guidance that the contusion will create a ecchymoses on the lower leg from gravity. Patient declines pain medication.  Evaluation does not show pathology that would require ongoing emergent intervention or inpatient treatment. Pt is hemodynamically stable and mentating appropriately. Discussed findings and plan with patient/guardian, who agrees with care plan. All questions answered. Return precautions discussed and outpatient follow up given.     Monico Blitz, PA-C 04/03/15 [place], MD 04/05/15 [phone]

## 2015-12-12 ENCOUNTER — Other Ambulatory Visit: Payer: Self-pay

## 2015-12-12 DIAGNOSIS — Z1231 Encounter for screening mammogram for malignant neoplasm of breast: Secondary | ICD-10-CM

## 2016-01-18 ENCOUNTER — Ambulatory Visit
Admission: RE | Admit: 2016-01-18 | Discharge: 2016-01-18 | Disposition: A | Discharge status: Home / Self Care | Payer: 59 | Source: Ambulatory Visit

## 2016-01-18 DIAGNOSIS — Z1231 Encounter for screening mammogram for malignant neoplasm of breast: Secondary | ICD-10-CM

## 2016-06-06 ENCOUNTER — Other Ambulatory Visit: Payer: Self-pay | Admitting: Family Medicine

## 2016-06-06 DIAGNOSIS — I158 Other secondary hypertension: Secondary | ICD-10-CM

## 2016-06-26 ENCOUNTER — Ambulatory Visit
Admission: RE | Admit: 2016-06-26 | Discharge: 2016-06-26 | Disposition: A | Discharge status: Home / Self Care | Payer: 59 | Source: Ambulatory Visit | Attending: Family Medicine | Admitting: Family Medicine

## 2016-06-26 DIAGNOSIS — I158 Other secondary hypertension: Secondary | ICD-10-CM

## 2016-06-26 MED ORDER — GADOBENATE DIMEGLUMINE 529 MG/ML IV SOLN
20.0000 mL | Freq: Once | INTRAVENOUS | Status: AC | PRN
  Administered 2016-06-26: 20 mL via INTRAVENOUS

## 2016-07-10 ENCOUNTER — Telehealth: Payer: Self-pay | Admitting: Cardiovascular Disease

## 2016-07-10 NOTE — Telephone Encounter (Signed)
Received records from [facility] for appointment on 07/17/16 with Dr Fletcher Anon.  Records given to [facility] (medical records) for Dr Tyrell Antonio schedule on 07/17/16. lp

## 2016-07-17 ENCOUNTER — Ambulatory Visit (INDEPENDENT_AMBULATORY_CARE_PROVIDER_SITE_OTHER): Payer: 59 | Admitting: Cardiovascular Disease

## 2016-07-17 VITALS — BP 130/80 | HR 62 | Ht 68.0 in | Wt 234.4 lb

## 2016-07-17 DIAGNOSIS — I701 Atherosclerosis of renal artery: Secondary | ICD-10-CM | POA: Diagnosis not present

## 2016-07-17 DIAGNOSIS — I10 Essential (primary) hypertension: Secondary | ICD-10-CM

## 2016-07-17 NOTE — Progress Notes (Signed)
Cardiology Office Note   Date:  07/17/2016   ID:  Karen Mills, Karen Mills 1956/03/24, MRN [id number]  PCP:   Melinda Crutch, MD  Cardiologist:   Kathlyn Sacramento, MD   Chief Complaint  Patient presents with  . New Patient (Initial Visit)      History of Present Illness: ROSAMAE Mills is a 60 y.o. female who was referred by Dr. Harrington Challenger for evaluation of hypertension and renal artery stenosis. The patient reports prolonged history of hypertension of at least 20 years of duration which has worsened recently. She was found to have systolic blood pressure in the 200 range. She was using Advil for pain but stopped taking all nonsteroidal anti-inflammatory medications. She saw Dr. Wynonia Lawman for a heart murmur and echocardiogram was overall unremarkable. He added spironolactone and since then and blood pressure has been well-controlled.  She has family history of hypertension. She is not a smoker and drinks alcohol occasionally. She denies any chest pain or shortness of breath. I reviewed her most recent labs in August which showed normal kidney function and electrolytes. Her potassium was 4.9. She had renal artery MRA which showed normal right renal artery. There was 2 renal arteries on the left side which showed somewhat beaded appearance of the superior left renal artery and mild stenosis affecting the inferior left renal artery.    Past Medical History:  Diagnosis Date  . Hyperlipidemia   . Hypertension   . Thyroid disease   . Varicose veins     Past Surgical History:  Procedure Laterality Date  . ABLATION ON ENDOMETRIOSIS    . FOOT SURGERY    . TONSILLECTOMY    . vein removal       Current Outpatient Prescriptions  Medication Sig Dispense Refill  . atorvastatin (LIPITOR) 20 MG tablet Take 20 mg by mouth daily.  4  . Biotin (BIOTIN 5000) 5 MG CAPS Take by mouth daily. 2 capsules once a day.    . bisoprolol-hydrochlorothiazide (ZIAC) 10-6.25 MG tablet Take 1 tablet by mouth daily.  4    . Cyanocobalamin (VITAMIN B 12 PO) Take 100 mcg by mouth daily.    . furosemide (LASIX) 20 MG tablet Take 20 mg by mouth daily.    . hydrALAZINE (APRESOLINE) 25 MG tablet Take 1 tablet by mouth 3 (three) times daily.  3  . levothyroxine (SYNTHROID, LEVOTHROID) 150 MCG tablet Take 1 tablet by mouth daily.  4  . olmesartan (BENICAR) 40 MG tablet Take 40 mg by mouth daily.    Marland Kitchen spironolactone (ALDACTONE) 25 MG tablet Take 25 mg by mouth daily.    . vitamin C (ASCORBIC ACID) 500 MG tablet Take 500 mg by mouth daily. 2 tablets daily.    . vitamin E 400 UNIT capsule Take 400 Units by mouth 2 (two) times daily.    Marland Kitchen zinc gluconate 50 MG tablet Take 50 mg by mouth daily.     No current facility-administered medications for this visit.     Allergies:   Accupril [quinapril hcl]; Amlodipine besylate; Atacand [candesartan]; Cozaar [losartan potassium]; Maxzide [hydrochlorothiazide w-triamterene]; Tiazac [diltiazem hcl er beads]; and Zestril [lisinopril]    Social History:  The patient  reports that she has never smoked. She has never used smokeless tobacco. She reports that she drinks alcohol. She reports that she does not use drugs.   Family History:  The patient's family history includes Heart disease in her brother, father, and mother.    ROS:  Please see  the history of present illness.   Otherwise, review of systems are positive for none.   All other systems are reviewed and negative.    PHYSICAL EXAM: VS:  BP 130/80   Pulse 62   Ht 5\' 8"  (1.727 m)   Wt 234 lb 6.4 oz (106.3 kg)   BMI 35.64 kg/m  , BMI Body mass index is 35.64 kg/m. GEN: Well nourished, well developed, in no acute distress  HEENT: normal  Neck: no JVD, carotid bruits, or masses Cardiac: RRR; no murmurs, rubs, or gallops,no edema  Respiratory:  clear to auscultation bilaterally, normal work of breathing GI: soft, nontender, nondistended, + BS MS: no deformity or atrophy  Skin: warm and dry, no rash Neuro:  Strength  and sensation are intact Psych: euthymic mood, full affect   EKG:  EKG is ordered today. The ekg ordered today demonstrates normal sinus rhythm with LVH.   Recent Labs: No results found for requested labs within last 8760 hours.    Lipid Panel No results found for: CHOL, TRIG, HDL, CHOLHDL, VLDL, LDLCALC, LDLDIRECT    Wt Readings from Last 3 Encounters:  07/17/16 234 lb 6.4 oz (106.3 kg)  04/13/14 210 lb (95.3 kg)  02/15/14 220 lb (99.8 kg)      No flowsheet data found.    ASSESSMENT AND PLAN:  1.  Essential hypertension: I suspect that her elevated blood pressure is likely due to essential hypertension and renal artery stenosis. Blood pressure is controlled now on current medications.  2. Renal artery stenosis: MRA was suggestive of possible stenosis involving one of the left renal arteries. The right renal artery was normal. I discussed the finding with the patient and explained to her that MRA tends to overestimate the severity of renal artery stenosis. The indications for renal artery revascularization are currently limited and are reserved for severe cases associated with recurrent heart failure or refractory hypertension. Her blood pressure has now been well-controlled after the addition of spironolactone. She uses hydralazine usually as needed if her blood pressure is elevated throughout the day. I'm going to follow her in 6 months and do a renal artery duplex at that time.    Disposition:   FU with me in 6 months  Signed,  Kathlyn Sacramento, MD  07/17/2016 8:49 AM    [place]

## 2016-07-17 NOTE — Patient Instructions (Signed)
Medication Instructions:  Your physician recommends that you continue on your current medications as directed. Please refer to the Current Medication list given to you today.  Labwork: No new orders.   Testing/Procedures: Your physician has requested that you have a renal artery duplex in 6 MONTHS. During this test, an ultrasound is used to evaluate blood flow to the kidneys. Allow one hour for this exam. Do not eat after midnight the day before and avoid carbonated beverages. Take your medications as you usually do.  Follow-Up: Your physician wants you to follow-up in: 6 MONTHS with Dr Fletcher Anon. You will receive a reminder letter in the mail two months in advance. If you don't receive a letter, please call our office to schedule the follow-up appointment.   Any Other Special Instructions Will Be Listed Below (If Applicable).     If you need a refill on your cardiac medications before your next appointment, please call your pharmacy.

## 2016-10-10 DIAGNOSIS — C44722 Squamous cell carcinoma of skin of right lower limb, including hip: Secondary | ICD-10-CM | POA: Diagnosis not present

## 2016-10-10 DIAGNOSIS — L570 Actinic keratosis: Secondary | ICD-10-CM | POA: Diagnosis not present

## 2016-10-10 DIAGNOSIS — C4492 Squamous cell carcinoma of skin, unspecified: Secondary | ICD-10-CM

## 2016-10-10 HISTORY — DX: Squamous cell carcinoma of skin, unspecified: C44.92

## 2016-10-29 NOTE — Addendum Note (Signed)
Addended by: Waylan Rocher on: 10/29/2016 11:46 AM   Modules accepted: Orders

## 2016-11-02 DIAGNOSIS — S335XXA Sprain of ligaments of lumbar spine, initial encounter: Secondary | ICD-10-CM | POA: Diagnosis not present

## 2016-11-02 DIAGNOSIS — M5136 Other intervertebral disc degeneration, lumbar region: Secondary | ICD-10-CM | POA: Diagnosis not present

## 2016-11-15 DIAGNOSIS — D0471 Carcinoma in situ of skin of right lower limb, including hip: Secondary | ICD-10-CM | POA: Diagnosis not present

## 2016-11-15 DIAGNOSIS — C44722 Squamous cell carcinoma of skin of right lower limb, including hip: Secondary | ICD-10-CM | POA: Diagnosis not present

## 2016-11-15 DIAGNOSIS — D099 Carcinoma in situ, unspecified: Secondary | ICD-10-CM

## 2016-11-15 HISTORY — DX: Carcinoma in situ, unspecified: D09.9

## 2016-12-06 ENCOUNTER — Other Ambulatory Visit: Payer: Self-pay | Admitting: Obstetrics and Gynecology

## 2016-12-06 DIAGNOSIS — Z1231 Encounter for screening mammogram for malignant neoplasm of breast: Secondary | ICD-10-CM

## 2017-01-16 ENCOUNTER — Encounter (HOSPITAL_COMMUNITY): Payer: Self-pay | Admitting: Cardiovascular Disease

## 2017-01-18 ENCOUNTER — Ambulatory Visit
Admission: RE | Admit: 2017-01-18 | Discharge: 2017-01-18 | Disposition: A | Discharge status: Home / Self Care | Payer: 59 | Source: Ambulatory Visit | Attending: Obstetrics and Gynecology | Admitting: Obstetrics and Gynecology

## 2017-01-18 DIAGNOSIS — Z1231 Encounter for screening mammogram for malignant neoplasm of breast: Secondary | ICD-10-CM

## 2017-01-29 DIAGNOSIS — I359 Nonrheumatic aortic valve disorder, unspecified: Secondary | ICD-10-CM | POA: Diagnosis not present

## 2017-01-29 DIAGNOSIS — I872 Venous insufficiency (chronic) (peripheral): Secondary | ICD-10-CM | POA: Diagnosis not present

## 2017-01-29 DIAGNOSIS — I119 Hypertensive heart disease without heart failure: Secondary | ICD-10-CM | POA: Diagnosis not present

## 2017-07-10 DIAGNOSIS — E782 Mixed hyperlipidemia: Secondary | ICD-10-CM | POA: Diagnosis not present

## 2017-07-10 DIAGNOSIS — Z0000 Encounter for general adult medical examination without abnormal findings: Secondary | ICD-10-CM | POA: Diagnosis not present

## 2017-07-10 DIAGNOSIS — I10 Essential (primary) hypertension: Secondary | ICD-10-CM | POA: Diagnosis not present

## 2017-07-10 DIAGNOSIS — E039 Hypothyroidism, unspecified: Secondary | ICD-10-CM | POA: Diagnosis not present

## 2017-09-20 ENCOUNTER — Ambulatory Visit: Payer: 59

## 2017-09-20 ENCOUNTER — Ambulatory Visit (INDEPENDENT_AMBULATORY_CARE_PROVIDER_SITE_OTHER): Payer: 59

## 2017-09-20 ENCOUNTER — Other Ambulatory Visit: Payer: Self-pay | Admitting: Podiatry

## 2017-09-20 ENCOUNTER — Ambulatory Visit (INDEPENDENT_AMBULATORY_CARE_PROVIDER_SITE_OTHER): Payer: 59 | Admitting: Podiatry

## 2017-09-20 ENCOUNTER — Encounter: Payer: Self-pay | Admitting: Podiatry

## 2017-09-20 VITALS — BP 137/68 | HR 72 | Resp 16

## 2017-09-20 DIAGNOSIS — M779 Enthesopathy, unspecified: Secondary | ICD-10-CM

## 2017-09-20 DIAGNOSIS — M79671 Pain in right foot: Secondary | ICD-10-CM

## 2017-09-20 DIAGNOSIS — M205X2 Other deformities of toe(s) (acquired), left foot: Secondary | ICD-10-CM

## 2017-09-20 DIAGNOSIS — M79672 Pain in left foot: Secondary | ICD-10-CM | POA: Diagnosis not present

## 2017-09-20 DIAGNOSIS — M1000 Idiopathic gout, unspecified site: Secondary | ICD-10-CM | POA: Diagnosis not present

## 2017-09-20 MED ORDER — TRIAMCINOLONE ACETONIDE 10 MG/ML IJ SUSP
10.0000 mg | Freq: Once | INTRAMUSCULAR | Status: AC
  Administered 2017-09-20: 10 mg

## 2017-09-20 NOTE — Progress Notes (Signed)
   Subjective:    Patient ID: Karen Mills, female    DOB: [birth year], 61 y.o.   MRN: [id number]  HPI    Review of Systems  All other systems reviewed and are negative.      Objective:   Physical Exam        Assessment & Plan:

## 2017-09-20 NOTE — Patient Instructions (Signed)

## 2017-09-20 NOTE — Progress Notes (Signed)
Dg foot  

## 2017-09-21 NOTE — Progress Notes (Signed)
Subjective:   Patient ID: Karen Mills, female   DOB: 61 y.o.   MRN: [id number]   HPI Patient presents stating I have had an acute inflammation of my big toe joint left and I am not sure what may have happened.  I had surgery on this years ago and started well but is been very painful and making it hard for me to walk   Review of Systems  All other systems reviewed and are negative.       Objective:  Physical Exam  Constitutional: She appears well-developed and well-nourished.  Cardiovascular: Intact distal pulses.  Pulmonary/Chest: Effort normal.  Musculoskeletal: Normal range of motion.  Neurological: She is alert.  Skin: Skin is warm.  Nursing note and vitals reviewed.   Neurovascular status intact muscle strength adequate range of motion within normal limits with patient found to have inflammation and fluid around the first MPJ left with pain within the joint surface and reduced motion.  Previous surgery was done and there is no dorsal spurring noted at the current time and patient was found to have good digital perfusion and is well oriented x3 and states she does not smoke currently and would like to be more active     Assessment:  Inflammatory capsulitis of the first MPJ left with probable hallux limitus and arthritis present     Plan:  Reviewed condition and recommended that we treat this as an acute condition and I did discuss the differences between acute inflammation capsulitis versus the possibilities for gout.  I gave her instructions on gout reviewed her x-rays and injected the first MPJ 3 mg Kenalog 5 mg Xylocaine  X-rays indicate that there is quite a bit of spurring and narrowing of the joint around the first MPJ left with pins in place in the right looks healthy with no indications of pathology

## 2017-10-18 ENCOUNTER — Ambulatory Visit (INDEPENDENT_AMBULATORY_CARE_PROVIDER_SITE_OTHER): Payer: 59 | Admitting: Podiatry

## 2017-10-18 ENCOUNTER — Encounter: Payer: Self-pay | Admitting: Podiatry

## 2017-10-18 DIAGNOSIS — M779 Enthesopathy, unspecified: Secondary | ICD-10-CM

## 2017-10-18 DIAGNOSIS — M205X2 Other deformities of toe(s) (acquired), left foot: Secondary | ICD-10-CM

## 2017-10-18 NOTE — Progress Notes (Signed)
Subjective:   Patient ID: Karen Mills, female   DOB: 63 y.o.   MRN: [id number]   HPI Patient presents stating that the joint is doing much better but there is still some discomfort underneath the callus on the second metatarsal   ROS      Objective:  Physical Exam  Neurovascular status intact with diminished inflammation of the first MPJ left with moderate hallux limitus deformity and crepitus formation of the joint surface. Patient does have callus sub-second left     Assessment:  Improvement of hallux limitus with possibility for gout left and also possibility for inflammatory capsulitis second MPJ or plantar callus format     Plan:  H&P and condition reviewed. At this point for the left I recommended padding to offload second metatarsal and patient will be seen back if symptoms were to persist or if any issues with the first metatarsal should occur again

## 2017-11-27 DIAGNOSIS — R6889 Other general symptoms and signs: Secondary | ICD-10-CM | POA: Diagnosis not present

## 2017-11-27 DIAGNOSIS — R05 Cough: Secondary | ICD-10-CM | POA: Diagnosis not present

## 2017-12-10 ENCOUNTER — Other Ambulatory Visit: Payer: Self-pay | Admitting: Obstetrics and Gynecology

## 2017-12-10 DIAGNOSIS — Z1231 Encounter for screening mammogram for malignant neoplasm of breast: Secondary | ICD-10-CM

## 2018-01-20 ENCOUNTER — Ambulatory Visit: Payer: 59

## 2018-01-20 ENCOUNTER — Ambulatory Visit
Admission: RE | Admit: 2018-01-20 | Discharge: 2018-01-20 | Disposition: A | Discharge status: Home / Self Care | Payer: 59 | Source: Ambulatory Visit | Attending: Obstetrics and Gynecology | Admitting: Obstetrics and Gynecology

## 2018-01-20 DIAGNOSIS — Z1231 Encounter for screening mammogram for malignant neoplasm of breast: Secondary | ICD-10-CM | POA: Diagnosis not present

## 2018-02-10 DIAGNOSIS — I359 Nonrheumatic aortic valve disorder, unspecified: Secondary | ICD-10-CM | POA: Diagnosis not present

## 2018-02-10 DIAGNOSIS — E785 Hyperlipidemia, unspecified: Secondary | ICD-10-CM | POA: Diagnosis not present

## 2018-02-10 DIAGNOSIS — I119 Hypertensive heart disease without heart failure: Secondary | ICD-10-CM | POA: Diagnosis not present

## 2018-08-20 DIAGNOSIS — I10 Essential (primary) hypertension: Secondary | ICD-10-CM | POA: Diagnosis not present

## 2018-08-20 DIAGNOSIS — Z0000 Encounter for general adult medical examination without abnormal findings: Secondary | ICD-10-CM | POA: Diagnosis not present

## 2018-08-20 DIAGNOSIS — E782 Mixed hyperlipidemia: Secondary | ICD-10-CM | POA: Diagnosis not present

## 2018-08-20 DIAGNOSIS — E039 Hypothyroidism, unspecified: Secondary | ICD-10-CM | POA: Diagnosis not present

## 2018-10-08 DIAGNOSIS — C50919 Malignant neoplasm of unspecified site of unspecified female breast: Secondary | ICD-10-CM

## 2018-10-08 DIAGNOSIS — Z923 Personal history of irradiation: Secondary | ICD-10-CM

## 2018-10-08 HISTORY — DX: Malignant neoplasm of unspecified site of unspecified female breast: C50.919

## 2018-10-08 HISTORY — DX: Personal history of irradiation: Z92.3

## 2018-10-16 ENCOUNTER — Encounter: Payer: Self-pay | Admitting: Podiatry

## 2018-10-16 ENCOUNTER — Ambulatory Visit (INDEPENDENT_AMBULATORY_CARE_PROVIDER_SITE_OTHER): Payer: 59

## 2018-10-16 ENCOUNTER — Other Ambulatory Visit: Payer: Self-pay | Admitting: Podiatry

## 2018-10-16 ENCOUNTER — Ambulatory Visit (INDEPENDENT_AMBULATORY_CARE_PROVIDER_SITE_OTHER): Payer: 59 | Admitting: Podiatry

## 2018-10-16 DIAGNOSIS — M779 Enthesopathy, unspecified: Secondary | ICD-10-CM | POA: Diagnosis not present

## 2018-10-16 DIAGNOSIS — M1000 Idiopathic gout, unspecified site: Secondary | ICD-10-CM

## 2018-10-16 DIAGNOSIS — M79671 Pain in right foot: Secondary | ICD-10-CM

## 2018-10-16 MED ORDER — TRIAMCINOLONE ACETONIDE 10 MG/ML IJ SUSP
10.0000 mg | Freq: Once | INTRAMUSCULAR | Status: AC
  Administered 2018-10-16: 10 mg

## 2018-10-16 NOTE — Patient Instructions (Signed)

## 2018-10-17 ENCOUNTER — Telehealth: Payer: Self-pay | Admitting: Podiatry

## 2018-10-17 NOTE — Progress Notes (Signed)
Subjective:   Patient ID: Karen Mills, female   DOB: 63 y.o.   MRN: [id number]   HPI Patient presents stating yesterday her big toe joint became very sore in her right foot and swollen and it is hard for her to walk.  States that she does have family history of gout and that her brother has had it and other relatives.  Does not remember eating a rich meal or seafood recently   ROS      Objective:  Physical Exam  Neurovascular status intact muscle strength is adequate range of motion within normal limits with patient noted to have inflammation pain around the first MPJ right with fluid buildup around the joint with restriction of motion but more due to the patient's splinting due to the pain she is experiencing.  Patient has good digital perfusion and is well oriented x3     Assessment:  Possibility for gout versus acute capsulitis of the first MPJ right with history of hallux limitus left     Plan:  H&P and reviewed both conditions.  As far as gout goes we are going to consider medication and blood work but since this is the first true attack she is had I did give her information today on diet modification lifestyle changes.  I did a sterile prep of the joint I injected 3 mg dexamethasone Kenalog 5 mg Xylocaine periarticular and advised that if this is not better in the next 7 days she will reappoint immediately  X-ray indicates no indications that this is a structural hallux limitus condition with no ostial lysis or punctate type lesions

## 2018-10-17 NOTE — Telephone Encounter (Signed)
I called pt, informed that the injection she was given would work gradually, the steroid would work on the inflammation gradually and the numbing agents would wear off quicker, to go into a thick, stiff bottom shoe to decrease movement and irritation of the site and allow the area to rest, ice 3-4 times daily for 15-20 minutes/ session protecting the skin with a light cloth, if able to take OTC antiinflammatory medication as package instructs. I asked pt if she had an return appt, that Dr. Paulla Dolly had wanted her to make an appt if no better or problems in 7 days. Pt states understanding.

## 2018-10-17 NOTE — Telephone Encounter (Signed)
Pt was seen yesterday and received a shot for inflammation of her right foot. Foot does not seem better today and pt wanted to know if she needed to come back into the office today or if there was something that could be prescribed for her to take. Please give pt a call.

## 2018-10-29 ENCOUNTER — Ambulatory Visit (INDEPENDENT_AMBULATORY_CARE_PROVIDER_SITE_OTHER): Payer: 59 | Admitting: Podiatry

## 2018-10-29 ENCOUNTER — Encounter: Payer: Self-pay | Admitting: Podiatry

## 2018-10-29 DIAGNOSIS — M1000 Idiopathic gout, unspecified site: Secondary | ICD-10-CM | POA: Diagnosis not present

## 2018-10-29 DIAGNOSIS — M779 Enthesopathy, unspecified: Secondary | ICD-10-CM

## 2018-10-29 DIAGNOSIS — M205X1 Other deformities of toe(s) (acquired), right foot: Secondary | ICD-10-CM

## 2018-10-29 MED ORDER — DICLOFENAC SODIUM 75 MG PO TBEC: 75.0000 mg | DELAYED_RELEASE_TABLET | Freq: Two times a day (BID) | ORAL | 2 refills | Status: DC

## 2018-10-29 NOTE — Progress Notes (Signed)
Subjective:   Patient ID: Karen Mills, female   DOB: 63 y.o.   MRN: [id number]   HPI Patient states still having quite a bit of discomfort my big toe joint and the medicine seems to have helped somewhat but it is still sore   ROS      Objective:  Physical Exam  Neurovascular status intact with patient still found to have inflammation pain around the first MPJ right with discomfort of quite significant nature when I palpated the joint surface but no current swelling     Assessment:  Inflammatory capsulitis of the first MPJ right with no indications of advanced arthritis or joint restriction with possibility for gout     Plan:  H&P discussed condition and concerned somewhat about the chronic nature of the condition and its failure to respond to aggressive injection treatment and rigid bottom shoes.  At this point I recommended complete immobilization and place patient in air fracture walker which she will wear for 4 weeks and placed on diclofenac 75 mg twice daily and will be seen back at that time to reevaluate and see the response.  Will be seen earlier if any issues should occur

## 2018-11-03 ENCOUNTER — Telehealth: Payer: Self-pay | Admitting: *Deleted

## 2018-11-03 NOTE — Telephone Encounter (Signed)
Left voicemail message for patient at [phone] (cell #) to let them know their boot came in. I also let them know that they could pick it up at the front desk but to ask for me, Juliann Pulse, and I would help fit the boot to their foot.  Patient originally saw Dr. Paulla Dolly on Wednesday, October 29, 2018 but we did not have a boot that fit the patient properly. Boots were ordered and now they are in.

## 2018-11-24 ENCOUNTER — Ambulatory Visit: Payer: 59 | Admitting: Podiatry

## 2018-11-26 ENCOUNTER — Ambulatory Visit: Payer: 59 | Admitting: Podiatry

## 2018-12-12 ENCOUNTER — Other Ambulatory Visit: Payer: Self-pay | Admitting: Obstetrics and Gynecology

## 2018-12-12 DIAGNOSIS — Z1231 Encounter for screening mammogram for malignant neoplasm of breast: Secondary | ICD-10-CM

## 2019-01-23 ENCOUNTER — Ambulatory Visit: Payer: 59

## 2019-03-06 ENCOUNTER — Ambulatory Visit: Payer: 59

## 2019-03-28 ENCOUNTER — Other Ambulatory Visit: Payer: Self-pay

## 2019-03-28 ENCOUNTER — Ambulatory Visit
Admission: RE | Admit: 2019-03-28 | Discharge: 2019-03-28 | Disposition: A | Discharge status: Home / Self Care | Payer: 59 | Source: Ambulatory Visit | Attending: Obstetrics and Gynecology | Admitting: Obstetrics and Gynecology

## 2019-03-28 DIAGNOSIS — Z1231 Encounter for screening mammogram for malignant neoplasm of breast: Secondary | ICD-10-CM

## 2019-03-31 ENCOUNTER — Other Ambulatory Visit: Payer: Self-pay | Admitting: Obstetrics and Gynecology

## 2019-03-31 DIAGNOSIS — R928 Other abnormal and inconclusive findings on diagnostic imaging of breast: Secondary | ICD-10-CM

## 2019-04-01 ENCOUNTER — Other Ambulatory Visit: Payer: Self-pay

## 2019-04-01 ENCOUNTER — Other Ambulatory Visit: Payer: Self-pay | Admitting: Obstetrics and Gynecology

## 2019-04-01 ENCOUNTER — Ambulatory Visit
Admission: RE | Admit: 2019-04-01 | Discharge: 2019-04-01 | Disposition: A | Discharge status: Home / Self Care | Payer: 59 | Source: Ambulatory Visit | Attending: Obstetrics and Gynecology | Admitting: Obstetrics and Gynecology

## 2019-04-01 DIAGNOSIS — R928 Other abnormal and inconclusive findings on diagnostic imaging of breast: Secondary | ICD-10-CM

## 2019-04-01 DIAGNOSIS — N6320 Unspecified lump in the left breast, unspecified quadrant: Secondary | ICD-10-CM

## 2019-04-03 ENCOUNTER — Ambulatory Visit
Admission: RE | Admit: 2019-04-03 | Discharge: 2019-04-03 | Disposition: A | Discharge status: Home / Self Care | Payer: 59 | Source: Ambulatory Visit | Attending: Obstetrics and Gynecology | Admitting: Obstetrics and Gynecology

## 2019-04-03 ENCOUNTER — Other Ambulatory Visit: Payer: Self-pay

## 2019-04-03 DIAGNOSIS — N6320 Unspecified lump in the left breast, unspecified quadrant: Secondary | ICD-10-CM

## 2019-04-09 ENCOUNTER — Ambulatory Visit: Payer: Self-pay | Admitting: General Surgery

## 2019-04-09 DIAGNOSIS — C50512 Malignant neoplasm of lower-outer quadrant of left female breast: Secondary | ICD-10-CM

## 2019-04-09 DIAGNOSIS — Z170 Estrogen receptor positive status [ER+]: Secondary | ICD-10-CM

## 2019-04-14 ENCOUNTER — Other Ambulatory Visit: Payer: Self-pay | Admitting: General Surgery

## 2019-04-14 DIAGNOSIS — Z170 Estrogen receptor positive status [ER+]: Secondary | ICD-10-CM

## 2019-04-14 DIAGNOSIS — C50512 Malignant neoplasm of lower-outer quadrant of left female breast: Secondary | ICD-10-CM

## 2019-04-15 ENCOUNTER — Encounter: Payer: Self-pay | Admitting: Adult Health

## 2019-04-15 DIAGNOSIS — Z170 Estrogen receptor positive status [ER+]: Secondary | ICD-10-CM | POA: Insufficient documentation

## 2019-04-15 DIAGNOSIS — C50512 Malignant neoplasm of lower-outer quadrant of left female breast: Secondary | ICD-10-CM | POA: Insufficient documentation

## 2019-04-15 NOTE — Progress Notes (Signed)
Radiation Oncology         [phone] ________________________________  Name: Karen Mills        MRN: [id number]  Date of Service: 04/16/2019 DOB: January 25, 1956  KC:MKLK, Dwyane Luo, MD  Jovita Kussmaul, MD     REFERRING PHYSICIAN: [name], MD   DIAGNOSIS: The encounter diagnosis was Malignant neoplasm of lower-outer quadrant of left breast of female, estrogen receptor positive ([place]).   HISTORY OF PRESENT ILLNESS: Karen Mills is a 63 y.o. female seen in the multidisciplinary breast clinic for a new diagnosis of left breast cancer. The patient was noted to have a screening detected abnormality in the left breast. She had diagnostic imaging that showed a 4 x 4 x 4 Karen Mills mass in the left breast at 6:00. Her axilla was negative for adenopathy. A biopsy on 04/03/2019 and revealed a grade 1-2 invasive ductal carcinoma of the breast.  The tumor was ER/PR positive, HER2 negative with a Ki 67 of 15%. She has met with Dr. Marlou Starks and is planning lumpectomy with sentinel node biopsy on 04/30/2019. She is seen via Mychart to discuss recommendations of adjuvant radiotherapy.   PREVIOUS RADIATION THERAPY: No   PAST MEDICAL HISTORY:  Past Medical History:  Diagnosis Date   Hyperlipidemia    Hypertension    Thyroid disease    Varicose veins        PAST SURGICAL HISTORY: Past Surgical History:  Procedure Laterality Date   ABLATION ON ENDOMETRIOSIS     FOOT SURGERY     TONSILLECTOMY     vein removal       FAMILY HISTORY:  Family History  Problem Relation Age of Onset   Heart disease Mother    Heart disease Father    Heart disease Brother      SOCIAL HISTORY:  reports that she has never smoked. She has never used smokeless tobacco. She reports current alcohol use. She reports that she does not use drugs.  The patient is married and live sin [place]. She is accompanied by her daughter Karen Mills on the call. She works for [employer] and has been working remotely since  March.   ALLERGIES: Accupril [quinapril hcl], Amlodipine besylate, Atacand [candesartan], Cozaar [losartan potassium], Maxzide [hydrochlorothiazide w-triamterene], Tiazac [diltiazem hcl er beads], and Zestril [lisinopril]   MEDICATIONS:  Current Outpatient Medications  Medication Sig Dispense Refill   atorvastatin (LIPITOR) 20 MG tablet Take 20 mg by mouth 2 (two) times daily.   4   Biotin (BIOTIN 5000) 5 MG CAPS Take by mouth daily. 2 capsules once a day.     bisoprolol-hydrochlorothiazide (ZIAC) 10-6.25 MG tablet Take 1 tablet by mouth daily.  4   Cyanocobalamin (VITAMIN B 12 PO) Take 100 mcg by mouth daily.     diclofenac (VOLTAREN) 75 MG EC tablet Take 1 tablet (75 mg total) by mouth 2 (two) times daily. 50 tablet 2   furosemide (LASIX) 20 MG tablet Take 20 mg by mouth daily.     hydrALAZINE (APRESOLINE) 25 MG tablet Take 25 mg by mouth 3 (three) times daily.   3   levothyroxine (SYNTHROID, LEVOTHROID) 150 MCG tablet Take 1 tablet by mouth daily.  4   olmesartan (BENICAR) 40 MG tablet Take 40 mg by mouth daily.     Omega-3 Fatty Acids (FISH OIL PO) Take 1 tablet by mouth daily.     spironolactone (ALDACTONE) 25 MG tablet Take 25 mg by mouth daily.     telmisartan (MICARDIS) 80  MG tablet      vitamin C (ASCORBIC ACID) 500 MG tablet Take 500 mg by mouth daily. 2 tablets daily.     vitamin E 400 UNIT capsule Take 400 Units by mouth daily.      zinc gluconate 50 MG tablet Take 50 mg by mouth daily.     No current facility-administered medications for this encounter.      REVIEW OF SYSTEMS: On review of systems, the patient reports that she is doing well overall. She denies any chest pain, shortness of breath, cough, fevers, chills, night sweats, unintended weight changes. She denies any bowel or bladder disturbances, and denies abdominal pain, nausea or vomiting. She denies any new musculoskeletal or joint aches or pains. A complete review of systems is obtained and is  otherwise negative.     PHYSICAL EXAM:  Vitals unable to obtain due to nature of encounter.   In general this is a well appearing caucasian female in no acute distress. She's alert and oriented x4 and appropriate throughout the examination. Cardiopulmonary assessment is negative for acute distress and she exhibits normal effort. Breast exam is deferred.   ECOG = 0  0 - Asymptomatic (Fully active, able to carry on all predisease activities without restriction)  1 - Symptomatic but completely ambulatory (Restricted in physically strenuous activity but ambulatory and able to carry out work of a light or sedentary nature. For example, light housework, office work)  2 - Symptomatic, <50% in bed during the day (Ambulatory and capable of all self care but unable to carry out any work activities. Up and about more than 50% of waking hours)  3 - Symptomatic, >50% in bed, but not bedbound (Capable of only limited self-care, confined to bed or chair 50% or more of waking hours)  4 - Bedbound (Completely disabled. Cannot carry on any self-care. Totally confined to bed or chair)  5 - Death   Karen Mills Karen Mills, Karen Mills, Karen Mills, et al. [phone]). "Toxicity and response criteria of the [facility]". [place] Oncol. 5 (6): 649-55    LABORATORY DATA:  Lab Results  Component Value Date   WBC 6.3 06/24/2008   HGB 13.7 06/24/2008   HCT 40.1 06/24/2008   MCV 102.5 (H) 06/24/2008   PLT 340 06/24/2008   Lab Results  Component Value Date   NA 135 06/24/2008   K 3.7 06/24/2008   CL 99 06/24/2008   CO2 28 06/24/2008   No results found for: ALT, AST, GGT, ALKPHOS, BILITOT    RADIOGRAPHY: US Breast Ltd Uni Left Inc Axilla  Result Date: 04/01/2019 CLINICAL DATA:  Patient recalled from screening for left breast mass. EXAM: DIGITAL DIAGNOSTIC LEFT MAMMOGRAM WITH CAD AND TOMO ULTRASOUND LEFT BREAST COMPARISON:  Previous exam(s). ACR Breast Density Category b: There are scattered  areas of fibroglandular density. FINDINGS: Persistent lobular mass within the inferior left breast middle depth. Mammographic images were processed with CAD. Targeted ultrasound is performed, showing a 4 x 4 x 4 Karen Mills irregular hypoechoic rounded mass left breast 6 o'clock position 4 cm from the nipple. No left axillary adenopathy. IMPRESSION: Suspicious left breast mass 6 o'clock position. RECOMMENDATION: Ultrasound-guided core needle biopsy left breast mass. I have discussed the findings and recommendations with the patient. Results were also provided in writing at the conclusion of the visit. If applicable, a reminder letter will be sent to the patient regarding the next appointment. BI-RADS CATEGORY  4: Suspicious. Electronically Signed   By: Karen Situ  Rosana Mills M.D.   On: 04/01/2019 11:55   Karen Mills Diag Breast Tomo Uni Left  Result Date: 04/01/2019 CLINICAL DATA:  Patient recalled from screening for left breast mass. EXAM: DIGITAL DIAGNOSTIC LEFT MAMMOGRAM WITH CAD AND TOMO ULTRASOUND LEFT BREAST COMPARISON:  Previous exam(s). ACR Breast Density Category b: There are scattered areas of fibroglandular density. FINDINGS: Persistent lobular mass within the inferior left breast middle depth. Mammographic images were processed with CAD. Targeted ultrasound is performed, showing a 4 x 4 x 4 Karen Mills irregular hypoechoic rounded mass left breast 6 o'clock position 4 cm from the nipple. No left axillary adenopathy. IMPRESSION: Suspicious left breast mass 6 o'clock position. RECOMMENDATION: Ultrasound-guided core needle biopsy left breast mass. I have discussed the findings and recommendations with the patient. Results were also provided in writing at the conclusion of the visit. If applicable, a reminder letter will be sent to the patient regarding the next appointment. BI-RADS CATEGORY  4: Suspicious. Electronically Signed   By: Karen Mills M.D.   On: 04/01/2019 11:55   Karen Mills 3d Screen Breast Bilateral  Result Date:  03/30/2019 CLINICAL DATA:  Screening. EXAM: DIGITAL SCREENING BILATERAL MAMMOGRAM WITH TOMO AND CAD COMPARISON:  Previous exam(s). ACR Breast Density Category b: There are scattered areas of fibroglandular density. FINDINGS: In the left breast, a possible mass warrants further evaluation. This possible mass is seen within the lower LEFT breast, CC slice 53. In the right breast, no findings suspicious for malignancy. Images were processed with CAD. IMPRESSION: Further evaluation is suggested for possible mass in the left breast. RECOMMENDATION: Diagnostic mammogram and possibly ultrasound of the left breast. (Code:[id number]) The patient will be contacted regarding the findings, and additional imaging will be scheduled. BI-RADS CATEGORY  0: Incomplete. Need additional imaging evaluation and/or prior mammograms for comparison. Electronically Signed   By: Karen Mills M.D.   On: 03/30/2019 13:17   Karen Mills Clip Placement Left  Result Date: 04/03/2019 CLINICAL DATA:  Status post ultrasound-guided core biopsy of mass in the 6 o'clock location of the LEFT breast. EXAM: DIAGNOSTIC LEFT MAMMOGRAM POST ULTRASOUND BIOPSY COMPARISON:  Previous exam(s). FINDINGS: Mammographic images were obtained following ultrasound guided biopsy of mass in the 6 o'clock location of the LEFT breast and placement of a ribbon shaped clip. The ribbon shaped clip is 0.8 centimeters posterior to the biopsied mass. IMPRESSION: Tissue marker clip is 0.8 centimeters posterior to the biopsied mass. Final Assessment: Post Procedure Mammograms for Marker Placement Electronically Signed   By: Karen Mills M.D.   On: 04/03/2019 09:29   [place] Lt Breast Bx W Loc Dev 1st Lesion Img Bx Spec US Guide  Addendum Date: 04/06/2019   ADDENDUM REPORT: 04/06/2019 15:55 ADDENDUM: Pathology revealed GRADE I-II INVASIVE DUCTAL CARCINOMA of the LEFT breast, 6 o'clock, 4 cm from nipple, ribbon clip. This was found to be concordant by Dr. Nolon Mills. Pathology  results were discussed with the patient by telephone. The patient reported doing well after the biopsy with tenderness at the site. Post biopsy instructions and care were reviewed and questions were answered. The patient was encouraged to call [facility] for any additional concerns. Surgical consultation has been arranged with Dr. Autumn Messing at [facility] on April 09, 2019. Pathology results reported by Karen Acres, Karen Mills on 04/06/2019. Electronically Signed   By: Karen Mills M.D.   On: 04/06/2019 15:55   Result Date: 04/06/2019 CLINICAL DATA:  The patient presents for ultrasound-guided core biopsy of mass in the 6  o'clock location of the LEFT breast. EXAM: ULTRASOUND GUIDED LEFT BREAST CORE NEEDLE BIOPSY COMPARISON:  Previous exam(s). FINDINGS: I met with the patient and we discussed the procedure of ultrasound-guided biopsy, including benefits and alternatives. We discussed the high likelihood of a successful procedure. We discussed the risks of the procedure, including infection, bleeding, tissue injury, clip migration, and inadequate sampling. Informed written consent was given. The usual time-out protocol was performed immediately prior to the procedure. Lesion quadrant: 6 o'clock LEFT breast Using sterile technique and 1% Lidocaine as local anesthetic, under direct ultrasound visualization, a 12 gauge spring-loaded device was used to perform biopsy of mass in the 6 o'clock location of the LEFT breast using a LATERAL to MEDIAL approach. At the conclusion of the procedure a ribbon shaped tissue marker clip was deployed into the biopsy cavity. Follow up 2 view mammogram was performed and dictated separately. IMPRESSION: Ultrasound guided biopsy of LEFT breast mass. No apparent complications. Electronically Signed: By: Karen Mills M.D. On: 04/03/2019 09:17       IMPRESSION/PLAN: 1. Stage IA, cT1aN0M0 grade 2, ER/PR positive invasive ductal carcinoma of the left  breast. Dr. Lisbeth Mills discusses the pathology findings and reviews the nature of left breast disease. The consensus from the breast conference includes breast conservation with lumpectomy with sentinel node biopsy. Depending on the size of the final tumor measurements rendered by pathology, the tumor may be tested for Oncotype Dx score to determine a role for systemic therapy. Provided that chemotherapy is not indicated, the patient's course would then be followed by external radiotherapy to the breast followed by antiestrogen therapy. We discussed the risks, benefits, short, and long term effects of radiotherapy, and the patient is interested in proceeding. Dr. Lisbeth Mills discusses the delivery and logistics of radiotherapy and anticipates a course of 4 or 6 1/2 weeks of radiotherapy, 4 weeks appears to be a good fit with the data we currently have with deep inspiration breath hold technique. We will see her back about 2 weeks after surgery to discuss the simulation process and anticipate we starting radiotherapy about 4-6 weeks after surgery.    This encounter was provided by telemedicine platform MyChart.  The patient has given verbal consent for this type of encounter and has been advised to only accept a meeting of this type in a secure network environment. The time spent during this encounter was 45 minutes. The attendants for this meeting include Karen Nicely, Karen Mills, Dr. Lisbeth Mills, Karen Mills  and Karen Mills.  During the encounter,  Karen Nicely, Karen Mills, Dr. Lisbeth Mills, and Karen Mills were located at [facility] Radiation Oncology Department.  Karen Mills was located at home.    The above documentation reflects my direct findings during this shared patient visit. Please see the separate note by Dr. Lisbeth Mills on this date for the remainder of the patient's plan of care.    Karen Mills, PAC

## 2019-04-15 NOTE — Progress Notes (Signed)
Location of Breast Cancer: Malignant neoplasm of lower outer quadrant of left breast, ER +  Plan: Surgery, radiation therapy (? 4 weeks), antiestrogen  Did patient present with symptoms (if so, please note symptoms) or was this found on screening mammography?: Routine mammogram found a 4 mm mass in the lower portion of the left breast with normal looking lymph nodes.  Histology per Pathology Report: Left Breast Biopsy 04/03/2019   Receptor Status: ER(95% +), PR (95% +), Her2-neu (-), Ki-67(15%)   Past/Anticipated interventions by surgeon, if any: Dr. Marlou Starks 04/09/2019 -The patient appears to have a very small stage I cancer in the lower portion of her left breast. -I have talked to her about the different options for treatment and at this point she favors breast conservation.  I think this is very reasonable way of treating her cancer. -She is also a good candidate for sentinel node mapping. -I have discussed with her in detail the risks and benefits of the operation as well as some of the technical aspects including the use of a radioactive seed and she understands and wishes to proceed. -I will also go ahead and refer her to medical and radiation oncology to talk about adjuvant therapy. -Lumpectomy scheduled 04/30/2019  Past/Anticipated interventions by medical oncology, if any: Chemotherapy   Lymphedema issues, if any: No  Pain issues, if any: No  SAFETY ISSUES:  Prior radiation? No  Pacemaker/ICD? No  Possible current pregnancy? Postmenopausal  Is the patient on methotrexate? No  Current Complaints / other details:      Cori Razor, RN 04/15/2019,8:14 AM

## 2019-04-16 ENCOUNTER — Encounter: Payer: Self-pay | Admitting: Radiation Oncology

## 2019-04-16 ENCOUNTER — Other Ambulatory Visit: Payer: Self-pay

## 2019-04-16 ENCOUNTER — Ambulatory Visit
Admission: RE | Admit: 2019-04-16 | Discharge: 2019-04-16 | Disposition: A | Discharge status: Home / Self Care | Payer: 59 | Source: Ambulatory Visit | Attending: Radiation Oncology | Admitting: Radiation Oncology

## 2019-04-16 VITALS — Ht 69.0 in | Wt 225.0 lb

## 2019-04-16 DIAGNOSIS — C50512 Malignant neoplasm of lower-outer quadrant of left female breast: Secondary | ICD-10-CM

## 2019-04-16 DIAGNOSIS — Z170 Estrogen receptor positive status [ER+]: Secondary | ICD-10-CM

## 2019-04-17 ENCOUNTER — Telehealth: Payer: Self-pay | Admitting: Hematology and Oncology

## 2019-04-17 NOTE — Telephone Encounter (Signed)
Spoke with patient re new patient visit with Dr. Lindi Adie. Confirmed date/time/location/demographics.

## 2019-04-20 NOTE — Progress Notes (Signed)
[facility] CONSULT NOTE  Patient Care Team: Lawerance Cruel, MD as PCP - General (Family Medicine)  CHIEF COMPLAINTS/PURPOSE OF CONSULTATION:  Newly diagnosed breast cancer  HISTORY OF PRESENTING ILLNESS:  Karen Mills 63 y.o. female is here because of recent diagnosis of invasive ductal carcinoma of the right breast. The cancer was detected on a routine screening mammogram on 03/28/19. Diagnostic mammogram and [place] on 04/01/19 showed a 0.4cm mass in the left breast at the 6 o'clock position with no left axillary adenopathy. Biopsy on 04/03/19 showed grade 1-2 invasive ductal carcinoma, HER-2 negative (0), ER 95%, PR 95%, Ki67 15%. She presents to the clinic today for initial evaluation and discussion of treatment options.   I reviewed her records extensively and collaborated the history with the patient.  SUMMARY OF ONCOLOGIC HISTORY: Oncology History  Malignant neoplasm of lower-outer quadrant of left breast of female, estrogen receptor positive ([place])  04/03/2019 Cancer Staging   Staging form: Breast, AJCC 8th Edition - Clinical stage from 04/03/2019: Stage IA (cT1a, cN0, cM0, G2, ER+, PR+, HER2-) - Signed by Gardenia Phlegm, NP on 04/15/2019   04/15/2019 Initial Diagnosis   Screening mammogram detected 0.4cm mass in the left breast at the 6 o'clock position with no left axillary adenopathy. Biopsy on 04/03/19 showed IDC, grade 1-2, HER-2 - (0), ER +95%, PR+ 95%, Ki67 15%.       MEDICAL HISTORY:  Past Medical History:  Diagnosis Date  . Hyperlipidemia   . Hypertension   . Thyroid disease   . Varicose veins     SURGICAL HISTORY: Past Surgical History:  Procedure Laterality Date  . ABLATION ON ENDOMETRIOSIS    . FOOT SURGERY    . TONSILLECTOMY    . vein removal      SOCIAL HISTORY: Social History   Socioeconomic History  . Marital status: Married    Spouse name: Not on file  . Number of children: Not on file  . Years of education: Not on file   . Highest education level: Not on file  Occupational History  . Not on file  Social Needs  . Financial resource strain: Not on file  . Food insecurity    Worry: Not on file    Inability: Not on file  . Transportation needs    Medical: No    Non-medical: No  Tobacco Use  . Smoking status: Never Smoker  . Smokeless tobacco: Never Used  Substance and Sexual Activity  . Alcohol use: Yes  . Drug use: No  . Sexual activity: Not on file  Lifestyle  . Physical activity    Days per week: Not on file    Minutes per session: Not on file  . Stress: Not on file  Relationships  . Social Herbalist on phone: Not on file    Gets together: Not on file    Attends religious service: Not on file    Active member of club or organization: Not on file    Attends meetings of clubs or organizations: Not on file    Relationship status: Not on file  . Intimate partner violence    Fear of current or ex partner: Not on file    Emotionally abused: Not on file    Physically abused: Not on file    Forced sexual activity: Not on file  Other Topics Concern  . Not on file  Social History Narrative  . Not on file    FAMILY  HISTORY: Family History  Problem Relation Age of Onset  . Heart disease Mother   . Heart disease Father   . Heart disease Brother     ALLERGIES:  is allergic to accupril [quinapril hcl]; amlodipine besylate; atacand [candesartan]; cozaar [losartan potassium]; maxzide [hydrochlorothiazide w-triamterene]; tiazac [diltiazem hcl er beads]; and zestril [lisinopril].  MEDICATIONS:  Current Outpatient Medications  Medication Sig Dispense Refill  . atorvastatin (LIPITOR) 20 MG tablet Take 20 mg by mouth 2 (two) times daily.   4  . Biotin (BIOTIN 5000) 5 MG CAPS Take by mouth daily. 2 capsules once a day.    . bisoprolol-hydrochlorothiazide (ZIAC) 10-6.25 MG tablet Take 1 tablet by mouth daily.  4  . Cyanocobalamin (VITAMIN B 12 PO) Take 100 mcg by mouth daily.    .  diclofenac (VOLTAREN) 75 MG EC tablet Take 1 tablet (75 mg total) by mouth 2 (two) times daily. 50 tablet 2  . furosemide (LASIX) 20 MG tablet Take 20 mg by mouth daily.    . hydrALAZINE (APRESOLINE) 25 MG tablet Take 25 mg by mouth 3 (three) times daily.   3  . levothyroxine (SYNTHROID, LEVOTHROID) 150 MCG tablet Take 1 tablet by mouth daily.  4  . olmesartan (BENICAR) 40 MG tablet Take 40 mg by mouth daily.    . Omega-3 Fatty Acids (FISH OIL PO) Take 1 tablet by mouth daily.    Marland Kitchen spironolactone (ALDACTONE) 25 MG tablet Take 25 mg by mouth daily.    Marland Kitchen telmisartan (MICARDIS) 80 MG tablet     . vitamin C (ASCORBIC ACID) 500 MG tablet Take 500 mg by mouth daily. 2 tablets daily.    . vitamin E 400 UNIT capsule Take 400 Units by mouth daily.     Marland Kitchen zinc gluconate 50 MG tablet Take 50 mg by mouth daily.     No current facility-administered medications for this visit.     REVIEW OF SYSTEMS:   Constitutional: Denies fevers, chills or abnormal night sweats Eyes: Denies blurriness of vision, double vision or watery eyes Ears, nose, mouth, throat, and face: Denies mucositis or sore throat Respiratory: Denies cough, dyspnea or wheezes Cardiovascular: Denies palpitation, chest discomfort or lower extremity swelling Gastrointestinal:  Denies nausea, heartburn or change in bowel habits Skin: Denies abnormal skin rashes Lymphatics: Denies new lymphadenopathy or easy bruising Neurological:Denies numbness, tingling or new weaknesses Behavioral/Psych: Mood is stable, no new changes  Breast: Denies any palpable lumps or discharge All other systems were reviewed with the patient and are negative.  PHYSICAL EXAMINATION: ECOG PERFORMANCE STATUS: 0 - Asymptomatic  Vitals:   04/21/19 1542  BP: (!) 158/57  Pulse: (!) 57  Resp: 18  Temp: 99.8 F (37.7 C)  SpO2: 100%   Filed Weights   04/21/19 1542  Weight: 242 lb 3.2 oz (109.9 kg)    Physical exam not done due to COVID-19 precautions   LABORATORY DATA:  I have reviewed the data as listed Lab Results  Component Value Date   WBC 6.3 06/24/2008   HGB 13.7 06/24/2008   HCT 40.1 06/24/2008   MCV 102.5 (H) 06/24/2008   PLT 340 06/24/2008   Lab Results  Component Value Date   NA 135 06/24/2008   K 3.7 06/24/2008   CL 99 06/24/2008   CO2 28 06/24/2008    RADIOGRAPHIC STUDIES: I have personally reviewed the radiological reports and agreed with the findings in the report.  ASSESSMENT AND PLAN:  Malignant neoplasm of lower-outer quadrant of left breast of  female, estrogen receptor positive ([place]) 04/15/2019:Screening mammogram detected 0.4cm mass in the left breast at the 6 o'clock position with no left axillary adenopathy. Biopsy on 04/03/19 showed IDC, grade 1-2, HER-2 - (0), ER +95%, PR+ 95%, Ki67 15%.  T1 a N0 stage Ia clinical stage  Pathology and radiology counseling: Discussed with the patient, the details of pathology including the type of breast cancer,the clinical staging, the significance of ER, PR and HER-2/neu receptors and the implications for treatment. After reviewing the pathology in detail, we proceeded to discuss the different treatment options between surgery, radiation, chemotherapy, antiestrogen therapies.  Treatment plan: 1.  Breast conserving surgery with sentinel lymph node biopsy 2.  If the final tumor size is less than 0.5 cm and we do not need to do Oncotype DX testing If the final size is between 0.6 -1 cm we will look at the grade.  If the grade is 1 then she does not need Oncotype. 3.  Adjuvant radiation therapy 4.  Follow-up adjuvant antiestrogen therapy with anastrozole 1 mg daily x5 years  Return to clinic after surgery to discuss the pathology report with Doximity video visit.  All questions were answered. The patient knows to call the clinic with any problems, questions or concerns.   Rulon Eisenmenger, MD 04/21/2019    I, Molly Dorshimer, am acting as scribe for Nicholas Lose, MD.  I  have reviewed the above documentation for accuracy and completeness, and I agree with the above.

## 2019-04-21 ENCOUNTER — Inpatient Hospital Stay: Payer: 59 | Attending: Hematology and Oncology | Admitting: Hematology and Oncology

## 2019-04-21 ENCOUNTER — Other Ambulatory Visit: Payer: Self-pay

## 2019-04-21 DIAGNOSIS — C50512 Malignant neoplasm of lower-outer quadrant of left female breast: Secondary | ICD-10-CM | POA: Insufficient documentation

## 2019-04-21 DIAGNOSIS — Z170 Estrogen receptor positive status [ER+]: Secondary | ICD-10-CM | POA: Diagnosis not present

## 2019-04-21 NOTE — Assessment & Plan Note (Signed)
04/15/2019:Screening mammogram detected 0.4cm mass in the left breast at the 6 o'clock position with no left axillary adenopathy. Biopsy on 04/03/19 showed IDC, grade 1-2, HER-2 - (0), ER +95%, PR+ 95%, Ki67 15%.  T1 a N0 stage Ia clinical stage  Pathology and radiology counseling: Discussed with the patient, the details of pathology including the type of breast cancer,the clinical staging, the significance of ER, PR and HER-2/neu receptors and the implications for treatment. After reviewing the pathology in detail, we proceeded to discuss the different treatment options between surgery, radiation, chemotherapy, antiestrogen therapies.  Treatment plan: 1.  Breast conserving surgery with sentinel lymph node biopsy 2.  If the final tumor size is less than 0.5 cm and we do not need to do Oncotype DX testing 3.  Adjuvant radiation therapy 4.  Follow-up adjuvant antiestrogen therapy with anastrozole 1 mg daily x5 years  Return to clinic after surgery to discuss the pathology report with D[employer] video visit.

## 2019-04-22 ENCOUNTER — Encounter: Payer: Self-pay | Admitting: *Deleted

## 2019-04-23 ENCOUNTER — Other Ambulatory Visit: Payer: Self-pay

## 2019-04-23 ENCOUNTER — Encounter (HOSPITAL_BASED_OUTPATIENT_CLINIC_OR_DEPARTMENT_OTHER): Payer: Self-pay

## 2019-04-27 ENCOUNTER — Other Ambulatory Visit (HOSPITAL_COMMUNITY)
Admission: RE | Admit: 2019-04-27 | Discharge: 2019-04-27 | Disposition: A | Discharge status: Home / Self Care | Payer: 59 | Source: Ambulatory Visit | Attending: General Surgery | Admitting: General Surgery

## 2019-04-27 DIAGNOSIS — Z1159 Encounter for screening for other viral diseases: Secondary | ICD-10-CM | POA: Diagnosis not present

## 2019-04-28 LAB — SARS CORONAVIRUS 2 (TAT 6-24 HRS): SARS Coronavirus 2: NEGATIVE

## 2019-04-29 ENCOUNTER — Other Ambulatory Visit: Payer: Self-pay

## 2019-04-29 ENCOUNTER — Encounter (HOSPITAL_BASED_OUTPATIENT_CLINIC_OR_DEPARTMENT_OTHER)
Admission: RE | Admit: 2019-04-29 | Discharge: 2019-04-29 | Disposition: A | Discharge status: Home / Self Care | Payer: 59 | Source: Ambulatory Visit | Attending: General Surgery | Admitting: General Surgery

## 2019-04-29 ENCOUNTER — Ambulatory Visit
Admission: RE | Admit: 2019-04-29 | Discharge: 2019-04-29 | Disposition: A | Discharge status: Home / Self Care | Payer: 59 | Source: Ambulatory Visit | Attending: General Surgery | Admitting: General Surgery

## 2019-04-29 DIAGNOSIS — E7800 Pure hypercholesterolemia, unspecified: Secondary | ICD-10-CM | POA: Diagnosis not present

## 2019-04-29 DIAGNOSIS — Z01810 Encounter for preprocedural cardiovascular examination: Secondary | ICD-10-CM | POA: Insufficient documentation

## 2019-04-29 DIAGNOSIS — C50512 Malignant neoplasm of lower-outer quadrant of left female breast: Secondary | ICD-10-CM | POA: Diagnosis not present

## 2019-04-29 DIAGNOSIS — Z170 Estrogen receptor positive status [ER+]: Secondary | ICD-10-CM | POA: Diagnosis not present

## 2019-04-29 DIAGNOSIS — I10 Essential (primary) hypertension: Secondary | ICD-10-CM | POA: Diagnosis not present

## 2019-04-29 DIAGNOSIS — Z79890 Hormone replacement therapy (postmenopausal): Secondary | ICD-10-CM | POA: Diagnosis not present

## 2019-04-29 DIAGNOSIS — Z79899 Other long term (current) drug therapy: Secondary | ICD-10-CM | POA: Diagnosis not present

## 2019-04-29 LAB — BASIC METABOLIC PANEL
Anion gap: 10 (ref 5–15)
BUN: 31 mg/dL — ABNORMAL HIGH (ref 8–23)
CO2: 21 mmol/L — ABNORMAL LOW (ref 22–32)
Calcium: 9.6 mg/dL (ref 8.9–10.3)
Chloride: 105 mmol/L (ref 98–111)
Creatinine, Ser: 1.66 mg/dL — ABNORMAL HIGH (ref 0.44–1.00)
GFR calc Af Amer: 37 mL/min — ABNORMAL LOW (ref >60)
GFR calc non Af Amer: 32 mL/min — ABNORMAL LOW (ref >60)
Glucose, Bld: 99 mg/dL (ref 70–99)
Potassium: 4.5 mmol/L (ref 3.5–5.1)
Sodium: 136 mmol/L (ref 135–145)

## 2019-04-29 NOTE — Progress Notes (Signed)
Ensure presurg given to complete by 0815. Surgical soap given with instructions. Verbalized understanding

## 2019-04-30 ENCOUNTER — Encounter (HOSPITAL_BASED_OUTPATIENT_CLINIC_OR_DEPARTMENT_OTHER)
Admission: RE | Disposition: A | Discharge status: Home / Self Care | Payer: Self-pay | Source: Home / Self Care | Attending: General Surgery

## 2019-04-30 ENCOUNTER — Other Ambulatory Visit: Payer: Self-pay

## 2019-04-30 ENCOUNTER — Encounter (HOSPITAL_BASED_OUTPATIENT_CLINIC_OR_DEPARTMENT_OTHER): Payer: Self-pay | Admitting: Certified Registered"

## 2019-04-30 ENCOUNTER — Ambulatory Visit (HOSPITAL_COMMUNITY)
Admission: RE | Admit: 2019-04-30 | Discharge: 2019-04-30 | Disposition: A | Discharge status: Home / Self Care | Payer: 59 | Source: Ambulatory Visit | Attending: General Surgery | Admitting: General Surgery

## 2019-04-30 ENCOUNTER — Ambulatory Visit (HOSPITAL_BASED_OUTPATIENT_CLINIC_OR_DEPARTMENT_OTHER): Payer: 59 | Admitting: Certified Registered"

## 2019-04-30 ENCOUNTER — Ambulatory Visit
Admission: RE | Admit: 2019-04-30 | Discharge: 2019-04-30 | Disposition: A | Discharge status: Home / Self Care | Payer: 59 | Source: Ambulatory Visit | Attending: General Surgery | Admitting: General Surgery

## 2019-04-30 ENCOUNTER — Ambulatory Visit (HOSPITAL_BASED_OUTPATIENT_CLINIC_OR_DEPARTMENT_OTHER)
Admission: RE | Admit: 2019-04-30 | Discharge: 2019-04-30 | Disposition: A | Discharge status: Home / Self Care | Payer: 59 | Attending: General Surgery | Admitting: General Surgery

## 2019-04-30 DIAGNOSIS — C50512 Malignant neoplasm of lower-outer quadrant of left female breast: Secondary | ICD-10-CM | POA: Diagnosis not present

## 2019-04-30 DIAGNOSIS — E7800 Pure hypercholesterolemia, unspecified: Secondary | ICD-10-CM | POA: Insufficient documentation

## 2019-04-30 DIAGNOSIS — I10 Essential (primary) hypertension: Secondary | ICD-10-CM | POA: Insufficient documentation

## 2019-04-30 DIAGNOSIS — Z170 Estrogen receptor positive status [ER+]: Secondary | ICD-10-CM | POA: Insufficient documentation

## 2019-04-30 DIAGNOSIS — Z79890 Hormone replacement therapy (postmenopausal): Secondary | ICD-10-CM | POA: Insufficient documentation

## 2019-04-30 DIAGNOSIS — Z79899 Other long term (current) drug therapy: Secondary | ICD-10-CM | POA: Insufficient documentation

## 2019-04-30 HISTORY — DX: Other specified postprocedural states: R11.2

## 2019-04-30 HISTORY — PX: BREAST LUMPECTOMY: SHX2

## 2019-04-30 HISTORY — PX: BREAST LUMPECTOMY WITH RADIOACTIVE SEED AND SENTINEL LYMPH NODE BIOPSY: SHX6550

## 2019-04-30 HISTORY — DX: Hypothyroidism, unspecified: E03.9

## 2019-04-30 HISTORY — DX: Other specified postprocedural states: Z98.890

## 2019-04-30 SURGERY — BREAST LUMPECTOMY WITH RADIOACTIVE SEED AND SENTINEL LYMPH NODE BIOPSY
Anesthesia: General | Site: Breast | Laterality: Left

## 2019-04-30 MED ORDER — GABAPENTIN 300 MG PO CAPS
300.0000 mg | ORAL_CAPSULE | ORAL | Status: AC
  Administered 2019-04-30: 300 mg via ORAL

## 2019-04-30 MED ORDER — OXYCODONE HCL 5 MG PO TABS
ORAL_TABLET | ORAL | Status: AC
  Filled 2019-04-30: qty 1

## 2019-04-30 MED ORDER — FENTANYL CITRATE (PF) 100 MCG/2ML IJ SOLN
INTRAMUSCULAR | Status: AC
  Filled 2019-04-30: qty 2

## 2019-04-30 MED ORDER — ONDANSETRON HCL 4 MG/2ML IJ SOLN
INTRAMUSCULAR | Status: DC | PRN
  Administered 2019-04-30: 4 mg via INTRAVENOUS

## 2019-04-30 MED ORDER — LIDOCAINE 2% (20 MG/ML) 5 ML SYRINGE
INTRAMUSCULAR | Status: AC
  Filled 2019-04-30: qty 5

## 2019-04-30 MED ORDER — DEXAMETHASONE SODIUM PHOSPHATE 10 MG/ML IJ SOLN
INTRAMUSCULAR | Status: DC | PRN
  Administered 2019-04-30: 5 mg via INTRAVENOUS

## 2019-04-30 MED ORDER — LIDOCAINE 2% (20 MG/ML) 5 ML SYRINGE
INTRAMUSCULAR | Status: DC | PRN
  Administered 2019-04-30: 80 mg via INTRAVENOUS

## 2019-04-30 MED ORDER — CHLORHEXIDINE GLUCONATE CLOTH 2 % EX PADS: 6.0000 | MEDICATED_PAD | Freq: Once | CUTANEOUS | Status: DC

## 2019-04-30 MED ORDER — BUPIVACAINE HCL (PF) 0.5 % IJ SOLN
INTRAMUSCULAR | Status: DC | PRN
  Administered 2019-04-30: 15 mL via PERINEURAL

## 2019-04-30 MED ORDER — METOCLOPRAMIDE HCL 5 MG/ML IJ SOLN: 10.0000 mg | Freq: Once | INTRAMUSCULAR | Status: DC | PRN

## 2019-04-30 MED ORDER — TECHNETIUM TC 99M SULFUR COLLOID FILTERED
1.0000 | Freq: Once | INTRAVENOUS | Status: AC | PRN
  Administered 2019-04-30: 12:00:00 1 via INTRADERMAL

## 2019-04-30 MED ORDER — ONDANSETRON HCL 4 MG/2ML IJ SOLN
INTRAMUSCULAR | Status: AC
  Filled 2019-04-30: qty 2

## 2019-04-30 MED ORDER — FENTANYL CITRATE (PF) 250 MCG/5ML IJ SOLN
INTRAMUSCULAR | Status: DC | PRN
  Administered 2019-04-30 (×2): 25 ug via INTRAVENOUS
  Administered 2019-04-30: 50 ug via INTRAVENOUS

## 2019-04-30 MED ORDER — PROPOFOL 10 MG/ML IV BOLUS
INTRAVENOUS | Status: AC
  Filled 2019-04-30: qty 20

## 2019-04-30 MED ORDER — FENTANYL CITRATE (PF) 100 MCG/2ML IJ SOLN
50.0000 ug | INTRAMUSCULAR | Status: DC | PRN
  Administered 2019-04-30: 11:00:00 100 ug via INTRAVENOUS

## 2019-04-30 MED ORDER — OXYCODONE HCL 5 MG PO TABS
5.0000 mg | ORAL_TABLET | Freq: Once | ORAL | Status: AC
  Administered 2019-04-30: 5 mg via ORAL

## 2019-04-30 MED ORDER — MIDAZOLAM HCL 2 MG/2ML IJ SOLN
1.0000 mg | INTRAMUSCULAR | Status: DC | PRN
  Administered 2019-04-30: 1 mg via INTRAVENOUS

## 2019-04-30 MED ORDER — MIDAZOLAM HCL 2 MG/2ML IJ SOLN
INTRAMUSCULAR | Status: AC
  Filled 2019-04-30: qty 2

## 2019-04-30 MED ORDER — EPHEDRINE 5 MG/ML INJ
INTRAVENOUS | Status: AC
  Filled 2019-04-30: qty 10

## 2019-04-30 MED ORDER — GABAPENTIN 300 MG PO CAPS
ORAL_CAPSULE | ORAL | Status: AC
  Filled 2019-04-30: qty 1

## 2019-04-30 MED ORDER — CEFAZOLIN SODIUM-DEXTROSE 2-4 GM/100ML-% IV SOLN
INTRAVENOUS | Status: AC
  Filled 2019-04-30: qty 100

## 2019-04-30 MED ORDER — PROPOFOL 10 MG/ML IV BOLUS
INTRAVENOUS | Status: DC | PRN
  Administered 2019-04-30: 50 mg via INTRAVENOUS
  Administered 2019-04-30: 150 mg via INTRAVENOUS

## 2019-04-30 MED ORDER — SCOPOLAMINE 1 MG/3DAYS TD PT72: 1.0000 | MEDICATED_PATCH | Freq: Once | TRANSDERMAL | Status: DC

## 2019-04-30 MED ORDER — HYDROCODONE-ACETAMINOPHEN 5-325 MG PO TABS: 1.0000 | ORAL_TABLET | Freq: Four times a day (QID) | ORAL | 0 refills | Status: DC | PRN

## 2019-04-30 MED ORDER — LACTATED RINGERS IV SOLN
INTRAVENOUS | Status: DC
  Administered 2019-04-30 (×2): via INTRAVENOUS

## 2019-04-30 MED ORDER — CEFAZOLIN SODIUM-DEXTROSE 2-4 GM/100ML-% IV SOLN
2.0000 g | INTRAVENOUS | Status: AC
  Administered 2019-04-30: 2 g via INTRAVENOUS

## 2019-04-30 MED ORDER — ACETAMINOPHEN 500 MG PO TABS
1000.0000 mg | ORAL_TABLET | ORAL | Status: AC
  Administered 2019-04-30: 1000 mg via ORAL

## 2019-04-30 MED ORDER — MEPERIDINE HCL 25 MG/ML IJ SOLN: 6.2500 mg | INTRAMUSCULAR | Status: DC | PRN

## 2019-04-30 MED ORDER — ACETAMINOPHEN 500 MG PO TABS
ORAL_TABLET | ORAL | Status: AC
  Filled 2019-04-30: qty 2

## 2019-04-30 MED ORDER — EPHEDRINE SULFATE-NACL 50-0.9 MG/10ML-% IV SOSY
PREFILLED_SYRINGE | INTRAVENOUS | Status: DC | PRN
  Administered 2019-04-30 (×3): 10 mg via INTRAVENOUS

## 2019-04-30 MED ORDER — BUPIVACAINE LIPOSOME 1.3 % IJ SUSP
INTRAMUSCULAR | Status: DC | PRN
  Administered 2019-04-30: 10 mL via PERINEURAL

## 2019-04-30 MED ORDER — DEXAMETHASONE SODIUM PHOSPHATE 10 MG/ML IJ SOLN
INTRAMUSCULAR | Status: AC
  Filled 2019-04-30: qty 1

## 2019-04-30 MED ORDER — FENTANYL CITRATE (PF) 100 MCG/2ML IJ SOLN
25.0000 ug | INTRAMUSCULAR | Status: DC | PRN
  Administered 2019-04-30 (×2): 25 ug via INTRAVENOUS

## 2019-04-30 MED ORDER — BUPIVACAINE HCL (PF) 0.25 % IJ SOLN
INTRAMUSCULAR | Status: DC | PRN
  Administered 2019-04-30: 27 mL

## 2019-04-30 SURGICAL SUPPLY — 44 items
APPLIER CLIP 9.375 MED OPEN (MISCELLANEOUS) ×2
BLADE SURG 15 STRL LF DISP TIS (BLADE) ×1 IMPLANT
BLADE SURG 15 STRL SS (BLADE) ×1
CANISTER SUC SOCK COL 7IN (MISCELLANEOUS) IMPLANT
CANISTER SUCT 1200ML W/VALVE (MISCELLANEOUS) IMPLANT
CHLORAPREP W/TINT 26 (MISCELLANEOUS) ×2 IMPLANT
CLIP APPLIE 9.375 MED OPEN (MISCELLANEOUS) ×1 IMPLANT
COVER BACK TABLE REUSABLE LG (DRAPES) ×2 IMPLANT
COVER MAYO STAND REUSABLE (DRAPES) ×2 IMPLANT
COVER PROBE W GEL 5X96 (DRAPES) ×2 IMPLANT
COVER WAND RF STERILE (DRAPES) IMPLANT
DECANTER SPIKE VIAL GLASS SM (MISCELLANEOUS) IMPLANT
DERMABOND ADVANCED (GAUZE/BANDAGES/DRESSINGS) ×1
DERMABOND ADVANCED .7 DNX12 (GAUZE/BANDAGES/DRESSINGS) ×1 IMPLANT
DRAPE LAPAROSCOPIC ABDOMINAL (DRAPES) ×2 IMPLANT
DRAPE UTILITY XL STRL (DRAPES) ×2 IMPLANT
ELECT COATED BLADE 2.86 ST (ELECTRODE) ×2 IMPLANT
ELECT REM PT RETURN 9FT ADLT (ELECTROSURGICAL) ×2
ELECTRODE REM PT RTRN 9FT ADLT (ELECTROSURGICAL) ×1 IMPLANT
GLOVE BIO SURGEON STRL SZ7 (GLOVE) ×4 IMPLANT
GLOVE BIO SURGEON STRL SZ7.5 (GLOVE) ×2 IMPLANT
GLOVE BIOGEL PI IND STRL 7.0 (GLOVE) ×2 IMPLANT
GLOVE BIOGEL PI INDICATOR 7.0 (GLOVE) ×2
GOWN STRL REUS W/ TWL LRG LVL3 (GOWN DISPOSABLE) ×3 IMPLANT
GOWN STRL REUS W/TWL LRG LVL3 (GOWN DISPOSABLE) ×3
ILLUMINATOR WAVEGUIDE N/F (MISCELLANEOUS) IMPLANT
KIT MARKER MARGIN INK (KITS) ×2 IMPLANT
LIGHT WAVEGUIDE WIDE FLAT (MISCELLANEOUS) IMPLANT
NDL SAFETY ECLIPSE 18X1.5 (NEEDLE) IMPLANT
NEEDLE HYPO 18GX1.5 SHARP (NEEDLE)
NEEDLE HYPO 25X1 1.5 SAFETY (NEEDLE) ×2 IMPLANT
NS IRRIG 1000ML POUR BTL (IV SOLUTION) IMPLANT
PACK BASIN DAY SURGERY FS (CUSTOM PROCEDURE TRAY) ×2 IMPLANT
PENCIL BUTTON HOLSTER BLD 10FT (ELECTRODE) ×2 IMPLANT
SLEEVE SCD COMPRESS KNEE MED (MISCELLANEOUS) ×2 IMPLANT
SPONGE LAP 18X18 RF (DISPOSABLE) ×2 IMPLANT
SUT MON AB 4-0 PC3 18 (SUTURE) ×4 IMPLANT
SUT SILK 2 0 SH (SUTURE) IMPLANT
SUT VICRYL 3-0 CR8 SH (SUTURE) ×2 IMPLANT
SYR CONTROL 10ML LL (SYRINGE) ×2 IMPLANT
TOWEL GREEN STERILE FF (TOWEL DISPOSABLE) ×2 IMPLANT
TRAY FAXITRON CT DISP (TRAY / TRAY PROCEDURE) ×2 IMPLANT
TUBE CONNECTING 20X1/4 (TUBING) IMPLANT
YANKAUER SUCT BULB TIP NO VENT (SUCTIONS) IMPLANT

## 2019-04-30 NOTE — Anesthesia Procedure Notes (Signed)
Anesthesia Regional Block: Pectoralis block   Pre-Anesthetic Checklist: ,, timeout performed, Correct Patient, Correct Site, Correct Laterality, Correct Procedure, Correct Position, site marked, Risks and benefits discussed,  Surgical consent,  Pre-op evaluation,  At surgeon's request and post-op pain management  Laterality: Left  Prep: Maximum Sterile Barrier Precautions used, chloraprep       Needles:  Injection technique: Single-shot  Needle Type: Echogenic Stimulator Needle     Needle Length: 10cm      Additional Needles:   Procedures:,,,, ultrasound used (permanent image in chart),,,,  Narrative:  Start time: 04/30/2019 11:40 AM End time: 04/30/2019 11:50 AM Injection made incrementally with aspirations every 5 mL.  Performed by: Personally  Anesthesiologist: Montez Hageman, MD  Additional Notes: Risks, benefits and alternative to block explained extensively.  Patient tolerated procedure well, without complications.

## 2019-04-30 NOTE — H&P (Signed)
Karen Mills  Location: [facility] Patient #: [id number] DOB: 02-17-1956 Married / Language: English / Race: White Female   History of Present Illness  The patient is a 63 year old female who presents with breast cancer. We're asked to see the patient in consultation by Dr. Paula Compton to evaluate her for a new left breast cancer. The patient is a 62 year old white female who recently went for a routine screening mammogram. At that time she was found to have a 4 mm mass in the lower portion of the left breast with normal-looking lymph nodes. The mass was biopsied and came back as an invasive ductal type of breast cancer that was ER and PR positive and HER-2 negative with a Ki-67 of 15%. She is otherwise in pretty good health and does not smoke.   Past Surgical History  No pertinent past surgical history   Diagnostic Studies History Colonoscopy  5-10 years ago Mammogram  within last year Pap Smear  >5 years ago  Allergies No Known Drug Allergies  Allergies Reconciled   Medication History Atorvastatin Calcium (20MG Tablet, Oral) Active. Bisoprolol-hydroCHLOROthiazide (10-6.25MG Tablet, Oral) Active. Diclofenac Sodium (75MG Tablet DR, Oral) Active. Furosemide (20MG Tablet, Oral) Active. Levothyroxine Sodium (150MCG Tablet, Oral) Active. Spironolactone (25MG Tablet, Oral) Active. Telmisartan (80MG Tablet, Oral) Active.  Social History  Alcohol use  Occasional alcohol use. Caffeine use  Coffee. No drug use  Tobacco use  Never smoker.  Family History  Arthritis  Mother. Heart Disease  Brother, Father, Mother. Heart disease in female family member before age 53  Heart disease in female family member before age 70  Hypertension  Brother, Father, Mother. Malignant Neoplasm Of Pancreas  Brother.  Other Problems  Breast Cancer  High blood pressure  Hypercholesterolemia     Review of Systems  General Not Present- Appetite  Loss, Chills, Fatigue, Fever, Night Sweats, Weight Gain and Weight Loss. Skin Not Present- Change in Wart/Mole, Dryness, Hives, Jaundice, New Lesions, Non-Healing Wounds, Rash and Ulcer. HEENT Not Present- Earache, Hearing Loss, Hoarseness, Nose Bleed, Oral Ulcers, Ringing in the Ears, Seasonal Allergies, Sinus Pain, Sore Throat, Visual Disturbances, Wears glasses/contact lenses and Yellow Eyes. Respiratory Not Present- Bloody sputum, Chronic Cough, Difficulty Breathing, Snoring and Wheezing. Cardiovascular Not Present- Chest Pain, Difficulty Breathing Lying Down, Leg Cramps, Palpitations, Rapid Heart Rate, Shortness of Breath and Swelling of Extremities. Gastrointestinal Not Present- Abdominal Pain, Bloating, Bloody Stool, Change in Bowel Habits, Chronic diarrhea, Constipation, Difficulty Swallowing, Excessive gas, Gets full quickly at meals, Hemorrhoids, Indigestion, Nausea, Rectal Pain and Vomiting. Female Genitourinary Not Present- Frequency, Nocturia, Painful Urination, Pelvic Pain and Urgency. Musculoskeletal Not Present- Back Pain, Joint Pain, Joint Stiffness, Muscle Pain, Muscle Weakness and Swelling of Extremities. Neurological Not Present- Decreased Memory, Fainting, Headaches, Numbness, Seizures, Tingling, Tremor, Trouble walking and Weakness. Psychiatric Not Present- Anxiety, Bipolar, Change in Sleep Pattern, Depression, Fearful and Frequent crying. Endocrine Not Present- Cold Intolerance, Excessive Hunger, Hair Changes, Heat Intolerance, Hot flashes and New Diabetes. Hematology Not Present- Blood Thinners, Easy Bruising, Excessive bleeding, Gland problems, HIV and Persistent Infections.  Vitals Weight: 243.13 lb Height: 68in Body Surface Area: 2.22 m Body Mass Index: 36.97 kg/m  Temp.: 4F (Oral)  Pulse: 75 (Regular)  BP: 130/82(Sitting, Left Arm, Standard)       Physical Exam  General Mental Status-Alert. General Appearance-Consistent with stated  age. Hydration-Well hydrated. Voice-Normal.  Head and Neck Head-normocephalic, atraumatic with no lesions or palpable masses. Trachea-midline. Thyroid Gland Characteristics - normal size and consistency.  Eye Eyeball - Bilateral-Extraocular movements intact. Sclera/Conjunctiva - Bilateral-No scleral icterus.  Chest and Lung Exam Chest and lung exam reveals -quiet, even and easy respiratory effort with no use of accessory muscles and on auscultation, normal breath sounds, no adventitious sounds and normal vocal resonance. Inspection Chest Wall - Normal. Back - normal.  Breast Note: There is no palpable mass in either breast. There is no palpable axillary, supra clavicular, or cervical lymphadenopathy.   Cardiovascular Cardiovascular examination reveals -normal heart sounds, regular rate and rhythm with no murmurs and normal pedal pulses bilaterally.  Abdomen Inspection Inspection of the abdomen reveals - No Hernias. Skin - Scar - no surgical scars. Palpation/Percussion Palpation and Percussion of the abdomen reveal - Soft, Non Tender, No Rebound tenderness, No Rigidity (guarding) and No hepatosplenomegaly. Auscultation Auscultation of the abdomen reveals - Bowel sounds normal.  Neurologic Neurologic evaluation reveals -alert and oriented x 3 with no impairment of recent or remote memory. Mental Status-Normal.  Musculoskeletal Normal Exam - Left-Upper Extremity Strength Normal and Lower Extremity Strength Normal. Normal Exam - Right-Upper Extremity Strength Normal and Lower Extremity Strength Normal.  Lymphatic Head & Neck  General Head & Neck Lymphatics: Bilateral - Description - Normal. Axillary  General Axillary Region: Bilateral - Description - Normal. Tenderness - Non Tender. Femoral & Inguinal  Generalized Femoral & Inguinal Lymphatics: Bilateral - Description - Normal. Tenderness - Non Tender.    Assessment & Plan   MALIGNANT  NEOPLASM OF LOWER-OUTER QUADRANT OF LEFT BREAST OF FEMALE, ESTROGEN RECEPTOR POSITIVE (C50.512) Impression: The patient appears to have a very small stage I cancer in the lower portion of the left breast. I have talked her about the different options for treatment and at this point she favors breast conservation. I think this is a very reasonable way of treating her cancer. She is also a good candidate for sentinel node mapping. I have discussed with her in detail the risks and benefits of the operation as well as some of the technical aspects including the use of a radioactive seed and she understands and wishes to proceed. I will also go ahead and refer her to medical and radiation oncology to talk about adjuvant therapy.  Current Plans Referred to Oncology, for evaluation and follow up (Oncology). Routine. Pt Education - Breast Cancer: discussed with patient and provided information.

## 2019-04-30 NOTE — Interval H&P Note (Signed)
History and Physical Interval Note:  04/30/2019 11:58 AM  Karen Mills  has presented today for surgery, with the diagnosis of left breast cancer.  The various methods of treatment have been discussed with the patient and family. After consideration of risks, benefits and other options for treatment, the patient has consented to  Procedure(s): LEFT BREAST LUMPECTOMY WITH RADIOACTIVE SEED AND SENTINEL LYMPH NODE BIOPSY (Left) as a surgical intervention.  The patient's history has been reviewed, patient examined, no change in status, stable for surgery.  I have reviewed the patient's chart and labs.  Questions were answered to the patient's satisfaction.     [name]

## 2019-04-30 NOTE — Progress Notes (Signed)
  Assisted Dr. Carignan with left, ultrasound guided, pectoralis block. Side rails up, monitors on throughout procedure. See vital signs in flow sheet. Tolerated Procedure well. 

## 2019-04-30 NOTE — Anesthesia Postprocedure Evaluation (Signed)
Anesthesia Post Note  Patient: Karen Mills  Procedure(s) Performed: LEFT BREAST LUMPECTOMY WITH RADIOACTIVE SEED AND SENTINEL LYMPH NODE BIOPSY (Left Breast)     Patient location during evaluation: PACU Anesthesia Type: General Level of consciousness: awake and alert Pain management: pain level controlled Vital Signs Assessment: post-procedure vital signs reviewed and stable Respiratory status: spontaneous breathing, nonlabored ventilation, respiratory function stable and patient connected to nasal cannula oxygen Cardiovascular status: blood pressure returned to baseline and stable Postop Assessment: no apparent nausea or vomiting Anesthetic complications: no    Last Vitals:  Vitals:   04/30/19 1440 04/30/19 1530  BP: 131/62 139/65  Pulse: 62 60  Resp: 16 16  Temp:  36.7 C  SpO2: 93% 96%    Last Pain:  Vitals:   04/30/19 1530  TempSrc:   PainSc: 4                  Montez Hageman

## 2019-04-30 NOTE — Progress Notes (Signed)
Nuc med inj performed by nuc med staff. Pt tol well with no additional sedation required. VSS. Updated, emotional support provided.

## 2019-04-30 NOTE — Transfer of Care (Signed)
Immediate Anesthesia Transfer of Care Note  Patient: Karen Mills  Procedure(s) Performed: LEFT BREAST LUMPECTOMY WITH RADIOACTIVE SEED AND SENTINEL LYMPH NODE BIOPSY (Left Breast)  Patient Location: PACU  Anesthesia Type:GA combined with regional for post-op pain  Level of Consciousness: awake, alert , oriented and patient cooperative  Airway & Oxygen Therapy: Patient Spontanous Breathing and Patient connected to nasal cannula oxygen  Post-op Assessment: Report given to RN, Post -op Vital signs reviewed and stable and Patient moving all extremities  Post vital signs: Reviewed and stable  Last Vitals:  Vitals Value Taken Time  BP 142/64 04/30/19 1341  Temp    Pulse 65 04/30/19 1344  Resp 19 04/30/19 1344  SpO2 99 % 04/30/19 1344  Vitals shown include unvalidated device data.  Last Pain:  Vitals:   04/30/19 1059  TempSrc: Oral  PainSc: 0-No pain         Complications: No apparent anesthesia complications

## 2019-04-30 NOTE — Op Note (Signed)
04/30/2019  1:35 PM  PATIENT:  Karen Mills  63 y.o. female  PRE-OPERATIVE DIAGNOSIS:  left breast cancer  POST-OPERATIVE DIAGNOSIS:  left breast cancer  PROCEDURE:  Procedure(s): LEFT BREAST LUMPECTOMY WITH RADIOACTIVE SEED LOCALIZATION AND DEEP LEFT AXILLARY SENTINEL LYMPH NODE BIOPSY (Left)  SURGEON:  Surgeon(s) and Role:    * Jovita Kussmaul, MD - Primary  PHYSICIAN ASSISTANT:   ASSISTANTS: none   ANESTHESIA:   local and general  EBL:  25 mL   BLOOD ADMINISTERED:none  DRAINS: none   LOCAL MEDICATIONS USED:  MARCAINE     SPECIMEN:  Source of Specimen:  left breast tissue with additional anterior margin and sentinel nodes x 3  DISPOSITION OF SPECIMEN:  PATHOLOGY  COUNTS:  YES  TOURNIQUET:  * No tourniquets in log *  DICTATION: .Dragon Dictation   After informed consent was obtained the patient was brought to the operating room and placed in the supine position on the operating table.  After adequate induction of general anesthesia the patient's left chest, breast, and axillary area were prepped with ChloraPrep, allowed to dry, and draped in usual sterile manner.  An appropriate timeout was performed.  Previously an I-125 seed was placed in the lower aspect of the left breast to mark a small area of invasive breast cancer.  Also earlier in the day the patient underwent injection 1 mCi of technetium sulfur colloid in the subareolar position on the left.  Attention was first turned to the left axilla.  The neoprobe was set to technetium and an area of radioactivity was readily identified.  The area was infiltrated with quarter percent Marcaine.  A small transversely oriented incision was made overlying the area of radioactivity with a 15 blade knife.  The incision was carried through the skin and subcutaneous tissue sharply with the electrocautery until the deep left axillary space was entered.  The neoprobe was used to direct blunt hemostat dissection.  I was able to  identify 3 hot lymph nodes.  Each was excised sharply with the electrocautery and the lymphatics and small vessels around it were controlled with clips.  Ex vivo counts on these nodes ranged from 100 to 600.  No other hot or palpable lymph nodes were identified in the left axilla.  Hemostasis was achieved using the Bovie electrocautery.  The deep layer of the wound was closed with interrupted 3-0 Vicryl stitches.  The skin was closed with a running 4-0 Monocryl subcuticular stitch.  Attention was then turned to the left breast.  The neoprobe was set to I-125 in the area of radioactivity was readily identified in the 6 o'clock position of the left breast.  The area around this was infiltrated with quarter percent Marcaine.  An inframammary fold incision was made with a 15 blade knife.  The incision was carried through the skin and subcutaneous tissue sharply with electrocautery.  The dissection was then carried all the way to the chest wall and the breast tissue was separated from the pectoralis muscle until we had undermined a good portion of the lower portion of the breast beyond where the cancer was.  Next we dissected anterior to where the cancer was along with a good portion of the lower portion of the breast.  We then located the area of the tumor and the radioactive seed and removed a circular portion of breast tissue sharply with the electrocautery around the radioactive seed while checking the area of radioactivity frequently.  Once the specimen was removed  it was oriented with the appropriate paint colors.  A specimen radiograph was obtained that showed the clip and seed to be near the center of the specimen.  I did remove an additional anterior margin and this was marked appropriately.  This was all sent to pathology for further evaluation.  Additional dissection was then made superiorly and this breast tissue was brought down to the inframammary fold to fill the cavity.  This tissue was anchored to the  inframammary fold with interrupted 3-0 Vicryl stitches.  The cavity was marked with clips.  Next the subcutaneous tissue was closed with interrupted 3-0 Vicryl stitches.  The skin was then closed with a running 4-0 Monocryl subcuticular stitch.  Dermabond dressings were applied.  The patient tolerated the procedure well.  At the end of the case all needle sponge and instrument counts were correct.  The patient was then awakened and taken to recovery in stable condition.  PLAN OF CARE: Discharge to home after PACU  PATIENT DISPOSITION:  PACU - hemodynamically stable.   Delay start of Pharmacological VTE agent (>24hrs) due to surgical blood loss or risk of bleeding: not applicable

## 2019-04-30 NOTE — Anesthesia Preprocedure Evaluation (Signed)
Anesthesia Evaluation  Patient identified by MRN, date of birth, ID band Patient awake    Reviewed: Allergy & Precautions, NPO status , Patient's Chart, lab work & pertinent test results  History of Anesthesia Complications (+) PONV  Airway Mallampati: II  TM Distance: >3 FB Neck ROM: Full    Dental no notable dental hx.    Pulmonary neg pulmonary ROS,    Pulmonary exam normal breath sounds clear to auscultation       Cardiovascular hypertension, Pt. on medications Normal cardiovascular exam Rhythm:Regular Rate:Normal     Neuro/Psych negative neurological ROS  negative psych ROS   GI/Hepatic negative GI ROS, Neg liver ROS,   Endo/Other  negative endocrine ROS  Renal/GU negative Renal ROS  negative genitourinary   Musculoskeletal negative musculoskeletal ROS (+)   Abdominal   Peds negative pediatric ROS (+)  Hematology negative hematology ROS (+)   Anesthesia Other Findings   Reproductive/Obstetrics negative OB ROS                             Anesthesia Physical Anesthesia Plan  ASA: II  Anesthesia Plan: General   Post-op Pain Management:  Regional for Post-op pain   Induction: Intravenous  PONV Risk Score and Plan: 4 or greater and Ondansetron, Dexamethasone, Midazolam and Treatment may vary due to age or medical condition  Airway Management Planned: LMA  Additional Equipment:   Intra-op Plan:   Post-operative Plan:   Informed Consent: I have reviewed the patients History and Physical, chart, labs and discussed the procedure including the risks, benefits and alternatives for the proposed anesthesia with the patient or authorized representative who has indicated his/her understanding and acceptance.     Dental advisory given  Plan Discussed with: CRNA  Anesthesia Plan Comments:         Anesthesia Quick Evaluation

## 2019-04-30 NOTE — Discharge Instructions (Signed)
°  NO TYLENOL BEFORE 5 PM TODAY!     Post Anesthesia Home Care Instructions  Activity: Get plenty of rest for the remainder of the day. A responsible individual must stay with you for 24 hours following the procedure.  For the next 24 hours, DO NOT: -Drive a car -Paediatric nurse -Drink alcoholic beverages -Take any medication unless instructed by your physician -Make any legal decisions or sign important papers.  Meals: Start with liquid foods such as gelatin or soup. Progress to regular foods as tolerated. Avoid greasy, spicy, heavy foods. If nausea and/or vomiting occur, drink only clear liquids until the nausea and/or vomiting subsides. Call your physician if vomiting continues.  Special Instructions/Symptoms: Your throat may feel dry or sore from the anesthesia or the breathing tube placed in your throat during surgery. If this causes discomfort, gargle with warm salt water. The discomfort should disappear within 24 hours.  If you had a scopolamine patch placed behind your ear for the management of post- operative nausea and/or vomiting:  1. The medication in the patch is effective for 72 hours, after which it should be removed.  Wrap patch in a tissue and discard in the trash. Wash hands thoroughly with soap and water. 2. You may remove the patch earlier than 72 hours if you experience unpleasant side effects which may include dry mouth, dizziness or visual disturbances. 3. Avoid touching the patch. Wash your hands with soap and water after contact with the patch.       Information for Discharge Teaching: EXPAREL (bupivacaine liposome injectable suspension)   Your surgeon or anesthesiologist gave you EXPAREL(bupivacaine) to help control your pain after surgery.   EXPAREL is a local anesthetic that provides pain relief by numbing the tissue around the surgical site.  EXPAREL is designed to release pain medication over time and can control pain for up to 72  hours.  Depending on how you respond to EXPAREL, you may require less pain medication during your recovery.  Possible side effects:  Temporary loss of sensation or ability to move in the area where bupivacaine was injected.  Nausea, vomiting, constipation  Rarely, numbness and tingling in your mouth or lips, lightheadedness, or anxiety may occur.  Call your doctor right away if you think you may be experiencing any of these sensations, or if you have other questions regarding possible side effects.  Follow all other discharge instructions given to you by your surgeon or nurse. Eat a healthy diet and drink plenty of water or other fluids.  If you return to the hospital for any reason within 96 hours following the administration of EXPAREL, it is important for health care providers to know that you have received this anesthetic. A teal colored band has been placed on your arm with the date, time and amount of EXPAREL you have received in order to alert and inform your health care providers. Please leave this armband in place for the full 96 hours following administration, and then you may remove the band.

## 2019-04-30 NOTE — Anesthesia Procedure Notes (Signed)
Procedure Name: LMA Insertion Date/Time: 04/30/2019 12:17 PM Performed by: Myna Bright, CRNA Pre-anesthesia Checklist: Patient identified, Emergency Drugs available, Suction available and Patient being monitored Patient Re-evaluated:Patient Re-evaluated prior to induction Oxygen Delivery Method: Circle system utilized Preoxygenation: Pre-oxygenation with 100% oxygen Induction Type: IV induction Ventilation: Mask ventilation without difficulty LMA: LMA inserted LMA Size: 4.0 Tube type: Oral Number of attempts: 1 Placement Confirmation: positive ETCO2 and breath sounds checked- equal and bilateral Tube secured with: Tape Dental Injury: Teeth and Oropharynx as per pre-operative assessment

## 2019-05-01 ENCOUNTER — Encounter (HOSPITAL_BASED_OUTPATIENT_CLINIC_OR_DEPARTMENT_OTHER): Payer: Self-pay | Admitting: General Surgery

## 2019-05-01 ENCOUNTER — Telehealth: Payer: Self-pay | Admitting: Hematology and Oncology

## 2019-05-01 NOTE — Addendum Note (Signed)
Addendum  created 05/01/19 8721 by Daylyn Christine, Ernesta Amble, CRNA   Charge Capture section accepted

## 2019-05-01 NOTE — Assessment & Plan Note (Signed)
04/15/2019:Screening mammogram detected 0.4cm mass in the left breast at the 6 o'clock position with no left axillary adenopathy. Biopsy on 04/03/19 showed IDC, grade 1-2, HER-2 - (0), ER +95%, PR+ 95%, Ki67 15%.  T1 a N0 stage Ia clinical stage  Treatment plan: 1.  Breast conserving surgery with sentinel lymph node biopsy 04/30/2019 2.  If the final tumor size is less than 0.5 cm and we do not need to do Oncotype DX testing If the final size is between 0.6 -1 cm we will look at the grade.  If the grade is 1 then she does not need Oncotype. 3.  Adjuvant radiation therapy 4.  Follow-up adjuvant antiestrogen therapy with anastrozole 1 mg daily x5 years -------------------------------------------------------------------------------------------------------------------- 04/30/2019:  Pathology counseling: I discussed the final pathology report of the patient provided  a copy of this report. I discussed the margins as well as lymph node surgeries. We also discussed the final staging along with previously performed ER/PR and HER-2/neu testing.   

## 2019-05-01 NOTE — Telephone Encounter (Signed)
I talk with patient regarding video visit °

## 2019-05-05 ENCOUNTER — Telehealth: Payer: Self-pay | Admitting: Hematology and Oncology

## 2019-05-05 NOTE — Progress Notes (Signed)
HEMATOLOGY-ONCOLOGY MYCHART VIDEO VISIT PROGRESS NOTE  I connected with [name] on 05/06/2019 at  2:15 PM EDT by MyChart video conference and verified that I am speaking with the correct person using two identifiers.  I discussed the limitations, risks, security and privacy concerns of performing an evaluation and management service by MyChart and the availability of in person appointments.  I also discussed with the patient that there may be a patient responsible charge related to this service. The patient expressed understanding and agreed to proceed.  Patient's Location: Home Physician Location: Clinic  CHIEF COMPLIANT: Follow-up s/p lumpectomy to review pathology  INTERVAL HISTORY: Karen Mills is a 63 y.o. female with above-mentioned history of right breast cancer. She underwent a lumpectomy on 04/30/19 with Dr. Marlou Starks for which pathology confirmed grade 1 invasive ductal carcinoma, 0.8cm, with intermediate grade DCIS, lymphovascular invasion present, clear margins. Three left axillary lymph nodes were negative for carcinoma. She presents over MyChart today to review the pathology report and discuss further treatment.   Oncology History  Malignant neoplasm of lower-outer quadrant of left breast of female, estrogen receptor positive ([place])  04/03/2019 Cancer Staging   Staging form: Breast, AJCC 8th Edition - Clinical stage from 04/03/2019: Stage IA (cT1a, cN0, cM0, G2, ER+, PR+, HER2-) - Signed by Gardenia Phlegm, NP on 04/15/2019   04/15/2019 Initial Diagnosis   Screening mammogram detected 0.4cm mass in the left breast at the 6 o'clock position with no left axillary adenopathy. Biopsy on 04/03/19 showed IDC, grade 1-2, HER-2 - (0), ER +95%, PR+ 95%, Ki67 15%.    04/30/2019 Surgery   Left lumpectomy Marlou Starks): IDC, grade 1, 0.8cm, intermediate grade DCIS, lymphovascular invasion present, clear margins. Three left axillary lymph nodes negative for carcinoma.      REVIEW OF  SYSTEMS:   Constitutional: Denies fevers, chills or abnormal weight loss Eyes: Denies blurriness of vision Ears, nose, mouth, throat, and face: Denies mucositis or sore throat Respiratory: Denies cough, dyspnea or wheezes Cardiovascular: Denies palpitation, chest discomfort Gastrointestinal:  Denies nausea, heartburn or change in bowel habits Skin: Denies abnormal skin rashes Lymphatics: Denies new lymphadenopathy or easy bruising Neurological:Denies numbness, tingling or new weaknesses Behavioral/Psych: Mood is stable, no new changes  Extremities: No lower extremity edema Breast: denies any pain or lumps or nodules in either breasts All other systems were reviewed with the patient and are negative.  Observations/Objective:  There were no vitals filed for this visit. There is no height or weight on file to calculate BMI.  I have reviewed the data as listed CMP Latest Ref Rng & Units 04/29/2019 06/24/2008  Glucose 70 - 99 mg/dL 99 99  BUN 8 - 23 mg/dL 31(H) 10  Creatinine 0.44 - 1.00 mg/dL 1.66(H) 0.72  Sodium 135 - 145 mmol/L 136 135  Potassium 3.5 - 5.1 mmol/L 4.5 3.7  Chloride 98 - 111 mmol/L 105 99  CO2 22 - 32 mmol/L 21(L) 28  Calcium 8.9 - 10.3 mg/dL 9.6 9.2    Lab Results  Component Value Date   WBC 6.3 06/24/2008   HGB 13.7 06/24/2008   HCT 40.1 06/24/2008   MCV 102.5 (H) 06/24/2008   PLT 340 06/24/2008      Assessment Plan:  Malignant neoplasm of lower-outer quadrant of left breast of female, estrogen receptor positive ([place]) 04/15/2019:Screening mammogram detected 0.4cm mass in the left breast at the 6 o'clock position with no left axillary adenopathy. Biopsy on 04/03/19 showed IDC, grade 1-2, HER-2 - (0), ER +95%,  PR+ 95%, Ki67 15%.  T1 a N0 stage Ia clinical stage  Treatment plan: 1.  Breast conserving surgery with sentinel lymph node biopsy 04/30/2019: Grade 1 IDC 0.8 cm, 0/3 lymph nodes, ER 95%, PR 95%, KI 6715%, HER-2 negative, T1BN0 stage Ia 2.  Adjuvant  radiation therapy 3.  Follow-up adjuvant antiestrogen therapy with anastrozole 1 mg daily x5 years -------------------------------------------------------------------------------------------------------------------- 04/30/2019: Left lumpectomy: Grade 1 IDC 0.8 cm with intermediate grade DCIS, alvei present, margins negative, 0/3 lymph nodes negative, ER 95% strong, PR 95% strong, HER-2 negative, Ki-67 15%, T1 BN 0 stage Ia  Pathology counseling: I discussed the final pathology report of the patient provided  a copy of this report. I discussed the margins as well as lymph node surgeries. We also discussed the final staging along with previously performed ER/PR and HER-2/neu testing.  Since it is grade 1 and less than 1 cm, there is no need to do Oncotype testing.  Patient does not need systemic chemotherapy. Patient has appointment to see radiation oncology. I will see the patient back on the last day of radiation to discuss antiestrogen therapy.  I discussed the assessment and treatment plan with the patient. The patient was provided an opportunity to ask questions and all were answered. The patient agreed with the plan and demonstrated an understanding of the instructions. The patient was advised to call back or seek an in-person evaluation if the symptoms worsen or if the condition fails to improve as anticipated.   I provided 15 minutes of face-to-face MyChart video visit time during this encounter.    Rulon Eisenmenger, MD 05/06/2019   I, Molly Dorshimer, am acting as scribe for Nicholas Lose, MD.  I have reviewed the above documentation for accuracy and completeness, and I agree with the above.

## 2019-05-05 NOTE — Telephone Encounter (Signed)
Confirmed appt/verified info °

## 2019-05-06 ENCOUNTER — Inpatient Hospital Stay (HOSPITAL_BASED_OUTPATIENT_CLINIC_OR_DEPARTMENT_OTHER): Payer: 59 | Admitting: Hematology and Oncology

## 2019-05-06 DIAGNOSIS — Z170 Estrogen receptor positive status [ER+]: Secondary | ICD-10-CM | POA: Diagnosis not present

## 2019-05-06 DIAGNOSIS — C50512 Malignant neoplasm of lower-outer quadrant of left female breast: Secondary | ICD-10-CM | POA: Diagnosis not present

## 2019-05-19 ENCOUNTER — Ambulatory Visit
Admission: RE | Admit: 2019-05-19 | Discharge: 2019-05-19 | Disposition: A | Discharge status: Home / Self Care | Payer: 59 | Source: Ambulatory Visit | Attending: Radiation Oncology | Admitting: Radiation Oncology

## 2019-05-19 ENCOUNTER — Other Ambulatory Visit: Payer: Self-pay

## 2019-05-19 DIAGNOSIS — C50512 Malignant neoplasm of lower-outer quadrant of left female breast: Secondary | ICD-10-CM

## 2019-05-19 NOTE — Progress Notes (Addendum)
Radiation Oncology         [phone] ________________________________  Name: Karen Mills        MRN: [id number]  Date of Service: 05/19/2019 DOB: 18-Sep-1956  WU:JWJX, Dwyane Luo, MD  Nicholas Lose, MD     REFERRING PHYSICIAN: Nicholas Lose, MD   DIAGNOSIS: The encounter diagnosis was Malignant neoplasm of lower-outer quadrant of left breast of female, estrogen receptor positive ([place]).   HISTORY OF PRESENT ILLNESS: Karen Mills is a 63 y.o. female seen in the multidisciplinary breast clinic for a new diagnosis of left breast cancer. The patient was noted to have a screening detected abnormality in the left breast. She had diagnostic imaging that showed a 4 x 4 x 4 Mills mass in the left breast at 6:00. Her axilla was negative for adenopathy. A biopsy on 04/03/2019 and revealed a grade 1-2 invasive ductal carcinoma of the breast.  The tumor was ER/PR positive, HER2 negative with a Ki 67 of 15%. She underwent lumpectomy with sentinel node biopsy on 04/30/2019 which revealed a grade 1, 8 Mills invasive ductal carcinoma with LVSI. Her 3 sampled nodes were negative for disease and margins were clear of carcinoma. She sees Dr. Marlou Starks tomorrow. Her tumor was small and no Oncotype was needed so she will not receive chemotherapy. She is seen via Mychart to discuss recommendations of adjuvant radiotherapy.   PREVIOUS RADIATION THERAPY: No   PAST MEDICAL HISTORY:  Past Medical History:  Diagnosis Date  . Hyperlipidemia   . Hypertension   . Hypothyroidism   . PONV (postoperative nausea and vomiting)   . Thyroid disease   . Varicose veins        PAST SURGICAL HISTORY: Past Surgical History:  Procedure Laterality Date  . ABLATION ON ENDOMETRIOSIS    . BREAST LUMPECTOMY WITH RADIOACTIVE SEED AND SENTINEL LYMPH NODE BIOPSY Left 04/30/2019   Procedure: LEFT BREAST LUMPECTOMY WITH RADIOACTIVE SEED AND SENTINEL LYMPH NODE BIOPSY;  Surgeon: Jovita Kussmaul, MD;  Location: [facility];  Service: General;  Laterality: Left;  . FOOT SURGERY    . TONSILLECTOMY    . vein removal       FAMILY HISTORY:  Family History  Problem Relation Age of Onset  . Heart disease Mother   . Heart disease Father   . Heart disease Brother      SOCIAL HISTORY:  reports that she has never smoked. She has never used smokeless tobacco. She reports current alcohol use. She reports that she does not use drugs.  The patient is married and lives in [place]. She is accompanied by her daughter Karen Mills on the call. She works for [employer] and has been working remotely since March.   ALLERGIES: Accupril [quinapril hcl], Amlodipine besylate, Atacand [candesartan], Cozaar [losartan potassium], Maxzide [hydrochlorothiazide w-triamterene], Tiazac [diltiazem hcl er beads], and Zestril [lisinopril]   MEDICATIONS:  Current Outpatient Medications  Medication Sig Dispense Refill  . atorvastatin (LIPITOR) 20 MG tablet Take 20 mg by mouth once.   4  . Biotin (BIOTIN 5000) 5 MG CAPS Take by mouth daily. 2 capsules once a day.    . bisoprolol-hydrochlorothiazide (ZIAC) 10-6.25 MG tablet Take 1 tablet by mouth daily.  4  . cholecalciferol (VITAMIN D3) 25 MCG (1000 UT) tablet Take 1,000 Units by mouth daily.    . diclofenac (VOLTAREN) 75 MG EC tablet Take 1 tablet (75 mg total) by mouth 2 (two) times daily. 50 tablet 2  . furosemide (LASIX) 20 MG  tablet Take 20 mg by mouth daily.    . hydrALAZINE (APRESOLINE) 25 MG tablet Take 25 mg by mouth once.   3  . levothyroxine (SYNTHROID, LEVOTHROID) 150 MCG tablet Take 1 tablet by mouth daily.  4  . Omega-3 Fatty Acids (FISH OIL PO) Take 1 tablet by mouth daily.    . OREGANO PO Take 1 tablet by mouth daily.    Marland Kitchen spironolactone (ALDACTONE) 25 MG tablet Take 25 mg by mouth daily.    Marland Kitchen telmisartan (MICARDIS) 80 MG tablet     . Turmeric (QC TUMERIC COMPLEX PO) Take 1,000 mg by mouth daily.    . vitamin E 400 UNIT capsule Take 400 Units by mouth daily.     Marland Kitchen  zinc gluconate 50 MG tablet Take 50 mg by mouth daily.    Marland Kitchen HYDROcodone-acetaminophen (NORCO/VICODIN) 5-325 MG tablet Take 1-2 tablets by mouth every 6 (six) hours as needed for moderate pain or severe pain. (Patient not taking: Reported on 05/19/2019) 15 tablet 0   No current facility-administered medications for this encounter.      REVIEW OF SYSTEMS: On review of systems, the patient reports that she is doing well overall. She is having some pain in the left breast but denies edema of the breast or LUE. She denies any chest pain, shortness of breath, cough, fevers, chills, night sweats, unintended weight changes. She denies any bowel or bladder disturbances, and denies abdominal pain, nausea or vomiting. She denies any new musculoskeletal or joint aches or pains. A complete review of systems is obtained and is otherwise negative.     PHYSICAL EXAM:  Unable to assess due to nature of encounter.      ECOG = 1  0 - Asymptomatic (Fully active, able to carry on all predisease activities without restriction)  1 - Symptomatic but completely ambulatory (Restricted in physically strenuous activity but ambulatory and able to carry out work of a light or sedentary nature. For example, light housework, office work)  2 - Symptomatic, <50% in bed during the day (Ambulatory and capable of all self care but unable to carry out any work activities. Up and about more than 50% of waking hours)  3 - Symptomatic, >50% in bed, but not bedbound (Capable of only limited self-care, confined to bed or chair 50% or more of waking hours)  4 - Bedbound (Completely disabled. Cannot carry on any self-care. Totally confined to bed or chair)  5 - Death   Karen Mills, Karen Mills, Karen Mills, Karen Mills. [phone]). "Toxicity and response criteria of the [facility]". [place] Oncol. 5 (6): 649-55    LABORATORY DATA:  Lab Results  Component Value Date   WBC 6.3 06/24/2008   HGB 13.7 06/24/2008    HCT 40.1 06/24/2008   MCV 102.5 (H) 06/24/2008   PLT 340 06/24/2008   Lab Results  Component Value Date   NA 136 04/29/2019   K 4.5 04/29/2019   CL 105 04/29/2019   CO2 21 (L) 04/29/2019   No results found for: ALT, AST, GGT, ALKPHOS, BILITOT    RADIOGRAPHY: Nm Sentinel Node Inj-no Rpt (breast)  Result Date: 04/30/2019 Sulfur colloid was injected by the nuclear medicine technologist for melanoma sentinel node.   Mills Breast Surgical Specimen  Result Date: 04/30/2019 CLINICAL DATA:  Left lumpectomy for breast cancer. EXAM: SPECIMEN RADIOGRAPH OF THE LEFT BREAST COMPARISON:  Previous exam(s). FINDINGS: Status post excision of the left breast. The radioactive seed and ribbon shaped  biopsy marker clip are present, completely intact. IMPRESSION: Specimen radiograph of the left breast. Electronically Signed   By: Claudie Revering M.D.   On: 04/30/2019 14:47   Mills Lt Radioactive Seed Loc Mammo Guide  Result Date: 04/29/2019 CLINICAL DATA:  63 year old female for radioactive seed localization of LEFT breast cancer prior to lumpectomy EXAM: MAMMOGRAPHIC GUIDED RADIOACTIVE SEED LOCALIZATION OF THE LEFT BREAST COMPARISON:  Previous exam(s). FINDINGS: Patient presents for radioactive seed localization prior to LEFT lumpectomy. I met with the patient and we discussed the procedure of seed localization including benefits and alternatives. We discussed the high likelihood of a successful procedure. We discussed the risks of the procedure including infection, bleeding, tissue injury and further surgery. We discussed the low dose of radioactivity involved in the procedure. Informed, written consent was given. The usual time-out protocol was performed immediately prior to the procedure. Using mammographic guidance, sterile technique, 1% lidocaine and an I-125 radioactive seed, the RIBBON clip and mass or localized using a MEDIAL approach. The follow-up mammogram images confirm the seed in the expected location and  were marked for Dr. Marlou Starks. Follow-up survey of the patient confirms presence of the radioactive seed along the mass, between the mass and the RIBBON clip Order number of I-125 seed:  [id number]. Total activity:  2.119 millicuries.  Reference Date: 03/31/2019 The patient tolerated the procedure well and was released from the [facility]. She was given instructions regarding seed removal. IMPRESSION: Radioactive seed localization LEFT breast. The radioactive seed lies immediately adjacent to the mass and between the mass and RIBBON clip. No apparent complications. Electronically Signed   By: Margarette Canada M.D.   On: 04/29/2019 16:08       IMPRESSION/PLAN: 1. Stage IA, pT1bN0M0 grade 1, ER/PR positive invasive ductal carcinoma of the left breast. Karen Mills discusses the final pathology findings and reviews the nature of left breast disease. She is doing pretty well since surgery and does not need chemotherapy. She is ready to proceed with external radiotherapy to the breast followed by antiestrogen therapy. We discussed the risks, benefits, short, and long term effects of radiotherapy, and the patient is interested in proceeding. Karen Mills discusses the delivery and logistics of radiotherapy and he recommends a course of 4 weeks of radiotherapy  with deep inspiration breath hold technique. She is scheduled to simulate on Thursday at 8 am. She will consent at the time of simulation. We also discussed ibuprofen the days she does not take diclofenac for her postop discomfort. She will also discuss with Dr. Marlou Starks when she sees him tomorrow for a postop check.  This encounter was provided by telemedicine platform MyChart but our connection was poor and we switched to telephone encounter. The patient has given verbal consent for this type of encounter and has been advised to only accept a meeting of this type in a secure network environment. The time spent during this encounter was 25 minutes. The attendants for this  meeting include Karen Sayer, Karen Mills, Karen Mills, Karen Mills  and Karen Mills and her daughter Karen Mills.  During the encounter,  Karen Sayer, Karen Mills, Karen Mills, and Karen Mills were located at [facility].  Karen Mills and her daughter Karen Mills were located at home.    The above documentation reflects my direct findings during this shared patient visit. Please see the separate note by Karen Mills on this date for the remainder of the patient's plan of care.  Carola Rhine, PAC

## 2019-05-21 ENCOUNTER — Other Ambulatory Visit: Payer: Self-pay

## 2019-05-21 ENCOUNTER — Ambulatory Visit
Admission: RE | Admit: 2019-05-21 | Discharge: 2019-05-21 | Disposition: A | Discharge status: Home / Self Care | Payer: 59 | Source: Ambulatory Visit | Attending: Radiation Oncology | Admitting: Radiation Oncology

## 2019-05-21 DIAGNOSIS — Z170 Estrogen receptor positive status [ER+]: Secondary | ICD-10-CM | POA: Diagnosis not present

## 2019-05-21 DIAGNOSIS — Z510 Encounter for antineoplastic radiation therapy: Secondary | ICD-10-CM | POA: Insufficient documentation

## 2019-05-21 DIAGNOSIS — C50512 Malignant neoplasm of lower-outer quadrant of left female breast: Secondary | ICD-10-CM | POA: Insufficient documentation

## 2019-05-22 ENCOUNTER — Telehealth: Payer: Self-pay | Admitting: Hematology and Oncology

## 2019-05-22 NOTE — Telephone Encounter (Signed)
Confirmed 9/21 f/u with patient.

## 2019-05-28 ENCOUNTER — Telehealth: Payer: Self-pay | Admitting: *Deleted

## 2019-05-28 NOTE — Telephone Encounter (Signed)
Received call from pt wanting to know the percentage change of her cancer coming back if she were to not go through with Radiation and only take anti-estrogen therapy.  Per Dr. Lindi Adie, pt risk of re-occurrence without radiation is up to 20%. RN also educated pt that anti-estrogen therapy can help prevent distal cancers from forming.  Pt stated she was going to take some time to think about this with her husband and will call our office if she decides to not receive radiation therapy.

## 2019-05-29 DIAGNOSIS — Z510 Encounter for antineoplastic radiation therapy: Secondary | ICD-10-CM | POA: Diagnosis not present

## 2019-06-01 ENCOUNTER — Encounter: Payer: Self-pay | Admitting: *Deleted

## 2019-06-01 ENCOUNTER — Other Ambulatory Visit: Payer: Self-pay

## 2019-06-01 ENCOUNTER — Ambulatory Visit
Admission: RE | Admit: 2019-06-01 | Discharge: 2019-06-01 | Disposition: A | Discharge status: Home / Self Care | Payer: 59 | Source: Ambulatory Visit | Attending: Radiation Oncology | Admitting: Radiation Oncology

## 2019-06-01 DIAGNOSIS — Z170 Estrogen receptor positive status [ER+]: Secondary | ICD-10-CM

## 2019-06-01 DIAGNOSIS — C50512 Malignant neoplasm of lower-outer quadrant of left female breast: Secondary | ICD-10-CM

## 2019-06-01 DIAGNOSIS — Z510 Encounter for antineoplastic radiation therapy: Secondary | ICD-10-CM | POA: Diagnosis not present

## 2019-06-01 MED ORDER — RADIAPLEXRX EX GEL
Freq: Once | CUTANEOUS | Status: AC
  Administered 2019-06-01: 15:00:00 via TOPICAL

## 2019-06-01 MED ORDER — ALRA NON-METALLIC DEODORANT (RAD-ONC)
1.0000 "application " | Freq: Once | TOPICAL | Status: AC
  Administered 2019-06-01: 1 via TOPICAL

## 2019-06-01 NOTE — Progress Notes (Signed)
Pt here for patient teaching.  Pt given Radiation and You booklet, skin care instructions, Alra deodorant and Radiaplex gel.  Reviewed areas of pertinence such as fatigue, hair loss, skin changes, breast tenderness and breast swelling . Pt able to give teach back of to pat skin and use unscented/gentle soap,apply Radiaplex bid, avoid applying anything to skin within 4 hours of treatment, avoid wearing an under wire bra and to use an electric razor if they must shave. Pt verbalizes understanding of information given and will contact nursing with any questions or concerns.     Orvell Careaga M. Cayla Wiegand RN, BSN      

## 2019-06-02 ENCOUNTER — Ambulatory Visit
Admission: RE | Admit: 2019-06-02 | Discharge: 2019-06-02 | Disposition: A | Discharge status: Home / Self Care | Payer: 59 | Source: Ambulatory Visit | Attending: Radiation Oncology | Admitting: Radiation Oncology

## 2019-06-02 ENCOUNTER — Other Ambulatory Visit: Payer: Self-pay

## 2019-06-02 DIAGNOSIS — Z510 Encounter for antineoplastic radiation therapy: Secondary | ICD-10-CM | POA: Diagnosis not present

## 2019-06-03 ENCOUNTER — Other Ambulatory Visit: Payer: Self-pay

## 2019-06-03 ENCOUNTER — Ambulatory Visit
Admission: RE | Admit: 2019-06-03 | Discharge: 2019-06-03 | Disposition: A | Discharge status: Home / Self Care | Payer: 59 | Source: Ambulatory Visit | Attending: Radiation Oncology | Admitting: Radiation Oncology

## 2019-06-03 DIAGNOSIS — Z510 Encounter for antineoplastic radiation therapy: Secondary | ICD-10-CM | POA: Diagnosis not present

## 2019-06-04 ENCOUNTER — Ambulatory Visit
Admission: RE | Admit: 2019-06-04 | Discharge: 2019-06-04 | Disposition: A | Discharge status: Home / Self Care | Payer: 59 | Source: Ambulatory Visit | Attending: Radiation Oncology | Admitting: Radiation Oncology

## 2019-06-04 ENCOUNTER — Other Ambulatory Visit: Payer: Self-pay

## 2019-06-04 DIAGNOSIS — Z510 Encounter for antineoplastic radiation therapy: Secondary | ICD-10-CM | POA: Diagnosis not present

## 2019-06-05 ENCOUNTER — Other Ambulatory Visit: Payer: Self-pay

## 2019-06-05 ENCOUNTER — Ambulatory Visit: Admission: RE | Admit: 2019-06-05 | Payer: 59 | Source: Ambulatory Visit

## 2019-06-08 ENCOUNTER — Ambulatory Visit
Admission: RE | Admit: 2019-06-08 | Discharge: 2019-06-08 | Disposition: A | Discharge status: Home / Self Care | Payer: 59 | Source: Ambulatory Visit | Attending: Radiation Oncology | Admitting: Radiation Oncology

## 2019-06-08 ENCOUNTER — Other Ambulatory Visit: Payer: Self-pay

## 2019-06-08 DIAGNOSIS — Z510 Encounter for antineoplastic radiation therapy: Secondary | ICD-10-CM | POA: Diagnosis not present

## 2019-06-09 ENCOUNTER — Other Ambulatory Visit: Payer: Self-pay

## 2019-06-09 ENCOUNTER — Ambulatory Visit
Admission: RE | Admit: 2019-06-09 | Discharge: 2019-06-09 | Disposition: A | Discharge status: Home / Self Care | Payer: 59 | Source: Ambulatory Visit | Attending: Radiation Oncology | Admitting: Radiation Oncology

## 2019-06-09 DIAGNOSIS — Z170 Estrogen receptor positive status [ER+]: Secondary | ICD-10-CM | POA: Insufficient documentation

## 2019-06-09 DIAGNOSIS — Z510 Encounter for antineoplastic radiation therapy: Secondary | ICD-10-CM | POA: Insufficient documentation

## 2019-06-09 DIAGNOSIS — C50512 Malignant neoplasm of lower-outer quadrant of left female breast: Secondary | ICD-10-CM | POA: Diagnosis not present

## 2019-06-09 NOTE — Progress Notes (Signed)
  Radiation Oncology         [phone] ________________________________  Name: Karen Mills MRN: [id number]  Date: 05/21/2019  DOB: 10-Sep-1956   DIAGNOSIS:     ICD-10-CM   1. Malignant neoplasm of lower-outer quadrant of left breast of female, estrogen receptor positive ([place])  C50.512    Z17.0     SIMULATION AND TREATMENT PLANNING NOTE  The patient presented for simulation prior to beginning her course of radiation treatment for her diagnosis of left-sided breast cancer. The patient was placed in a supine position on a breast board. A customized vac-lock bag was constructed and this complex treatment device will be used on a daily basis during her treatment. In this fashion, a CT scan was obtained through the chest area and an isocenter was placed near the chest wall within the breast.  The patient will be planned to receive a course of radiation initially to a dose of 42.56 Gy. This will consist of a whole breast radiotherapy technique. To accomplish this, 2 customized blocks have been designed which will correspond to medial and lateral whole breast tangent fields. This treatment will be accomplished at 2.66 Gy per fraction. A forward planning technique will also be evaluated to determine if this approach improves the plan. It is anticipated that the patient will then receive a 8 Gy boost to the seroma cavity which has been contoured. This will be accomplished at 2 Gy per fraction.   This initial treatment will consist of a 3-D conformal technique. The seroma has been contoured as the primary target structure. Additionally, dose volume histograms of both this target as well as the lungs and heart will also be evaluated. Such an approach is necessary to ensure that the target area is adequately covered while the nearby critical normal structures are adequately spared.  Plan:  The final anticipated total dose therefore will correspond to 50.56 Gy.  Special treatment procedure was  performed today due to the extra time and effort required by myself to plan and prepare this patient for deep inspiration breath hold technique.  I have determined cardiac sparing to be of benefit to this patient to prevent long term cardiac damage due to radiation of the heart.  Bellows were placed on the patient's abdomen. To facilitate cardiac sparing, the patient was coached by the radiation therapists on breath hold techniques and breathing practice was performed. Practice waveforms were obtained. The patient was then scanned while maintaining breath hold in the treatment position.  This image was then transferred over to the imaging specialist. The imaging specialist then created a fusion of the free breathing and breath hold scans using the chest wall as the stable structure. I personally reviewed the fusion in axial, coronal and sagittal image planes.  Excellent cardiac sparing was obtained.  I felt the patient is an appropriate candidate for breath hold and the patient will be treated as such.  The image fusion was then reviewed with the patient to reinforce the necessity of reproducible breath hold.     _______________________________   Jodelle Gross, MD, PhD

## 2019-06-09 NOTE — Progress Notes (Signed)
  Radiation Oncology         [phone] ________________________________  Name: Karen Mills MRN: [id number]  Date: 05/21/2019  DOB: 02-Apr-1956  Optical Surface Tracking Plan:  Since intensity modulated radiotherapy (IMRT) and 3D conformal radiation treatment methods are predicated on accurate and precise positioning for treatment, intrafraction motion monitoring is medically necessary to ensure accurate and safe treatment delivery.  The ability to quantify intrafraction motion without excessive ionizing radiation dose can only be performed with optical surface tracking. Accordingly, surface imaging offers the opportunity to obtain 3D measurements of patient position throughout IMRT and 3D treatments without excessive radiation exposure.  I am ordering optical surface tracking for this patient's upcoming course of radiotherapy. ________________________________  Kyung Rudd, MD 06/09/2019 2:29 PM    Reference:   Particia Jasper, et al. Surface imaging-based analysis of intrafraction motion for breast radiotherapy patients.Journal of [place], n. 6, nov. 2014. ISSN [id number].   Available at: <http://www.jacmp.org/index.php/jacmp/article/view/4957>.

## 2019-06-10 ENCOUNTER — Other Ambulatory Visit: Payer: Self-pay

## 2019-06-10 ENCOUNTER — Ambulatory Visit
Admission: RE | Admit: 2019-06-10 | Discharge: 2019-06-10 | Disposition: A | Discharge status: Home / Self Care | Payer: 59 | Source: Ambulatory Visit | Attending: Radiation Oncology | Admitting: Radiation Oncology

## 2019-06-10 DIAGNOSIS — Z510 Encounter for antineoplastic radiation therapy: Secondary | ICD-10-CM | POA: Diagnosis not present

## 2019-06-11 ENCOUNTER — Other Ambulatory Visit: Payer: Self-pay

## 2019-06-11 ENCOUNTER — Ambulatory Visit
Admission: RE | Admit: 2019-06-11 | Discharge: 2019-06-11 | Disposition: A | Discharge status: Home / Self Care | Payer: 59 | Source: Ambulatory Visit | Attending: Radiation Oncology | Admitting: Radiation Oncology

## 2019-06-11 DIAGNOSIS — Z510 Encounter for antineoplastic radiation therapy: Secondary | ICD-10-CM | POA: Diagnosis not present

## 2019-06-12 ENCOUNTER — Ambulatory Visit
Admission: RE | Admit: 2019-06-12 | Discharge: 2019-06-12 | Disposition: A | Discharge status: Home / Self Care | Payer: 59 | Source: Ambulatory Visit | Attending: Radiation Oncology | Admitting: Radiation Oncology

## 2019-06-12 ENCOUNTER — Other Ambulatory Visit: Payer: Self-pay

## 2019-06-12 DIAGNOSIS — Z510 Encounter for antineoplastic radiation therapy: Secondary | ICD-10-CM | POA: Diagnosis not present

## 2019-06-16 ENCOUNTER — Ambulatory Visit
Admission: RE | Admit: 2019-06-16 | Discharge: 2019-06-16 | Disposition: A | Discharge status: Home / Self Care | Payer: 59 | Source: Ambulatory Visit | Attending: Radiation Oncology | Admitting: Radiation Oncology

## 2019-06-16 DIAGNOSIS — Z510 Encounter for antineoplastic radiation therapy: Secondary | ICD-10-CM | POA: Diagnosis not present

## 2019-06-17 ENCOUNTER — Ambulatory Visit
Admission: RE | Admit: 2019-06-17 | Discharge: 2019-06-17 | Disposition: A | Discharge status: Home / Self Care | Payer: 59 | Source: Ambulatory Visit | Attending: Radiation Oncology | Admitting: Radiation Oncology

## 2019-06-17 ENCOUNTER — Other Ambulatory Visit: Payer: Self-pay

## 2019-06-17 DIAGNOSIS — Z510 Encounter for antineoplastic radiation therapy: Secondary | ICD-10-CM | POA: Diagnosis not present

## 2019-06-18 ENCOUNTER — Other Ambulatory Visit: Payer: Self-pay

## 2019-06-18 ENCOUNTER — Ambulatory Visit
Admission: RE | Admit: 2019-06-18 | Discharge: 2019-06-18 | Disposition: A | Discharge status: Home / Self Care | Payer: 59 | Source: Ambulatory Visit | Attending: Radiation Oncology | Admitting: Radiation Oncology

## 2019-06-18 DIAGNOSIS — Z510 Encounter for antineoplastic radiation therapy: Secondary | ICD-10-CM | POA: Diagnosis not present

## 2019-06-19 ENCOUNTER — Other Ambulatory Visit: Payer: Self-pay

## 2019-06-19 ENCOUNTER — Ambulatory Visit
Admission: RE | Admit: 2019-06-19 | Discharge: 2019-06-19 | Disposition: A | Discharge status: Home / Self Care | Payer: 59 | Source: Ambulatory Visit | Attending: Radiation Oncology | Admitting: Radiation Oncology

## 2019-06-19 DIAGNOSIS — Z510 Encounter for antineoplastic radiation therapy: Secondary | ICD-10-CM | POA: Diagnosis not present

## 2019-06-19 DIAGNOSIS — Z170 Estrogen receptor positive status [ER+]: Secondary | ICD-10-CM

## 2019-06-19 DIAGNOSIS — C50512 Malignant neoplasm of lower-outer quadrant of left female breast: Secondary | ICD-10-CM

## 2019-06-19 MED ORDER — RADIAPLEXRX EX GEL
Freq: Once | CUTANEOUS | Status: AC
  Administered 2019-06-19: 16:00:00 via TOPICAL

## 2019-06-22 ENCOUNTER — Ambulatory Visit
Admission: RE | Admit: 2019-06-22 | Discharge: 2019-06-22 | Disposition: A | Discharge status: Home / Self Care | Payer: 59 | Source: Ambulatory Visit | Attending: Radiation Oncology | Admitting: Radiation Oncology

## 2019-06-22 DIAGNOSIS — Z510 Encounter for antineoplastic radiation therapy: Secondary | ICD-10-CM | POA: Diagnosis not present

## 2019-06-23 ENCOUNTER — Ambulatory Visit
Admission: RE | Admit: 2019-06-23 | Discharge: 2019-06-23 | Disposition: A | Discharge status: Home / Self Care | Payer: 59 | Source: Ambulatory Visit | Attending: Radiation Oncology | Admitting: Radiation Oncology

## 2019-06-23 DIAGNOSIS — Z510 Encounter for antineoplastic radiation therapy: Secondary | ICD-10-CM | POA: Diagnosis not present

## 2019-06-24 ENCOUNTER — Other Ambulatory Visit: Payer: Self-pay

## 2019-06-24 ENCOUNTER — Ambulatory Visit
Admission: RE | Admit: 2019-06-24 | Discharge: 2019-06-24 | Disposition: A | Discharge status: Home / Self Care | Payer: 59 | Source: Ambulatory Visit | Attending: Radiation Oncology | Admitting: Radiation Oncology

## 2019-06-24 DIAGNOSIS — Z510 Encounter for antineoplastic radiation therapy: Secondary | ICD-10-CM | POA: Diagnosis not present

## 2019-06-25 ENCOUNTER — Other Ambulatory Visit: Payer: Self-pay

## 2019-06-25 ENCOUNTER — Ambulatory Visit
Admission: RE | Admit: 2019-06-25 | Discharge: 2019-06-25 | Disposition: A | Discharge status: Home / Self Care | Payer: 59 | Source: Ambulatory Visit | Attending: Radiation Oncology | Admitting: Radiation Oncology

## 2019-06-25 DIAGNOSIS — Z510 Encounter for antineoplastic radiation therapy: Secondary | ICD-10-CM | POA: Diagnosis not present

## 2019-06-26 ENCOUNTER — Ambulatory Visit
Admission: RE | Admit: 2019-06-26 | Discharge: 2019-06-26 | Disposition: A | Discharge status: Home / Self Care | Payer: 59 | Source: Ambulatory Visit | Attending: Radiation Oncology | Admitting: Radiation Oncology

## 2019-06-26 ENCOUNTER — Other Ambulatory Visit: Payer: Self-pay

## 2019-06-26 DIAGNOSIS — Z510 Encounter for antineoplastic radiation therapy: Secondary | ICD-10-CM | POA: Diagnosis not present

## 2019-06-28 NOTE — Progress Notes (Signed)
Patient Care Team: Lawerance Cruel, MD as PCP - General (Family Medicine)  DIAGNOSIS:    ICD-10-CM   1. Malignant neoplasm of lower-outer quadrant of left breast of female, estrogen receptor positive ([place])  C50.512    Z17.0     SUMMARY OF ONCOLOGIC HISTORY: Oncology History  Malignant neoplasm of lower-outer quadrant of left breast of female, estrogen receptor positive ([place])  04/03/2019 Cancer Staging   Staging form: Breast, AJCC 8th Edition - Clinical stage from 04/03/2019: Stage IA (cT1a, cN0, cM0, G2, ER+, PR+, HER2-) - Signed by Gardenia Phlegm, NP on 04/15/2019   04/15/2019 Initial Diagnosis   Screening mammogram detected 0.4cm mass in the left breast at the 6 o'clock position with no left axillary adenopathy. Biopsy on 04/03/19 showed IDC, grade 1-2, HER-2 - (0), ER +95%, PR+ 95%, Ki67 15%.    04/30/2019 Surgery   Left lumpectomy Marlou Starks): IDC, grade 1, 0.8cm, intermediate grade DCIS, lymphovascular invasion present, clear margins. Three left axillary lymph nodes negative for carcinoma.    06/02/2019 -  Radiation Therapy   Adjuvant radiation     CHIEF COMPLIANT: Follow-up during radiation to discuss anti-estrogen therapy  INTERVAL HISTORY: Karen Mills is a 63 y.o. with above-mentioned history of right breast cancer who underwent a lumpectomy and is currently undergoing radiation treatment. She presents to the clinic today to discuss anti-estrogen therapy.   REVIEW OF SYSTEMS:   Constitutional: Denies fevers, chills or abnormal weight loss Eyes: Denies blurriness of vision Ears, nose, mouth, throat, and face: Denies mucositis or sore throat Respiratory: Denies cough, dyspnea or wheezes Cardiovascular: Denies palpitation, chest discomfort Gastrointestinal: Denies nausea, heartburn or change in bowel habits Skin: Denies abnormal skin rashes Lymphatics: Denies new lymphadenopathy or easy bruising Neurological: Denies numbness, tingling or new weaknesses  Behavioral/Psych: Mood is stable, no new changes  Extremities: No lower extremity edema Breast: denies any pain or lumps or nodules in either breasts All other systems were reviewed with the patient and are negative.  I have reviewed the past medical history, past surgical history, social history and family history with the patient and they are unchanged from previous note.  ALLERGIES:  is allergic to accupril [quinapril hcl]; amlodipine besylate; atacand [candesartan]; cozaar [losartan potassium]; maxzide [hydrochlorothiazide w-triamterene]; tiazac [diltiazem hcl er beads]; and zestril [lisinopril].  MEDICATIONS:  Current Outpatient Medications  Medication Sig Dispense Refill  . atorvastatin (LIPITOR) 20 MG tablet Take 20 mg by mouth once.   4  . Biotin (BIOTIN 5000) 5 MG CAPS Take by mouth daily. 2 capsules once a day.    . bisoprolol-hydrochlorothiazide (ZIAC) 10-6.25 MG tablet Take 1 tablet by mouth daily.  4  . cholecalciferol (VITAMIN D3) 25 MCG (1000 UT) tablet Take 1,000 Units by mouth daily.    . diclofenac (VOLTAREN) 75 MG EC tablet Take 1 tablet (75 mg total) by mouth 2 (two) times daily. 50 tablet 2  . furosemide (LASIX) 20 MG tablet Take 20 mg by mouth daily.    . hydrALAZINE (APRESOLINE) 25 MG tablet Take 25 mg by mouth once.   3  . HYDROcodone-acetaminophen (NORCO/VICODIN) 5-325 MG tablet Take 1-2 tablets by mouth every 6 (six) hours as needed for moderate pain or severe pain. (Patient not taking: Reported on 05/19/2019) 15 tablet 0  . levothyroxine (SYNTHROID, LEVOTHROID) 150 MCG tablet Take 1 tablet by mouth daily.  4  . Omega-3 Fatty Acids (FISH OIL PO) Take 1 tablet by mouth daily.    . OREGANO PO Take 1  tablet by mouth daily.    Marland Kitchen spironolactone (ALDACTONE) 25 MG tablet Take 25 mg by mouth daily.    Marland Kitchen telmisartan (MICARDIS) 80 MG tablet     . Turmeric (QC TUMERIC COMPLEX PO) Take 1,000 mg by mouth daily.    . vitamin E 400 UNIT capsule Take 400 Units by mouth daily.      Marland Kitchen zinc gluconate 50 MG tablet Take 50 mg by mouth daily.     No current facility-administered medications for this visit.     PHYSICAL EXAMINATION: ECOG PERFORMANCE STATUS: 1 - Symptomatic but completely ambulatory  There were no vitals filed for this visit. There were no vitals filed for this visit.  GENERAL: alert, no distress and comfortable SKIN: skin color, texture, turgor are normal, no rashes or significant lesions EYES: normal, Conjunctiva are pink and non-injected, sclera clear OROPHARYNX: no exudate, no erythema and lips, buccal mucosa, and tongue normal  NECK: supple, thyroid normal size, non-tender, without nodularity LYMPH: no palpable lymphadenopathy in the cervical, axillary or inguinal LUNGS: clear to auscultation and percussion with normal breathing effort HEART: regular rate & rhythm and no murmurs and no lower extremity edema ABDOMEN: abdomen soft, non-tender and normal bowel sounds MUSCULOSKELETAL: no cyanosis of digits and no clubbing  NEURO: alert & oriented x 3 with fluent speech, no focal motor/sensory deficits EXTREMITIES: No lower extremity edema  LABORATORY DATA:  I have reviewed the data as listed CMP Latest Ref Rng & Units 04/29/2019 06/24/2008  Glucose 70 - 99 mg/dL 99 99  BUN 8 - 23 mg/dL 31(H) 10  Creatinine 0.44 - 1.00 mg/dL 1.66(H) 0.72  Sodium 135 - 145 mmol/L 136 135  Potassium 3.5 - 5.1 mmol/L 4.5 3.7  Chloride 98 - 111 mmol/L 105 99  CO2 22 - 32 mmol/L 21(L) 28  Calcium 8.9 - 10.3 mg/dL 9.6 9.2    Lab Results  Component Value Date   WBC 6.3 06/24/2008   HGB 13.7 06/24/2008   HCT 40.1 06/24/2008   MCV 102.5 (H) 06/24/2008   PLT 340 06/24/2008    ASSESSMENT & PLAN:  Malignant neoplasm of lower-outer quadrant of left breast of female, estrogen receptor positive ([place]) 04/15/2019:Screening mammogram detected 0.4cm mass in the left breast at the 6 o'clock position with no left axillary adenopathy. Biopsy on 04/03/19 showed IDC, grade 1-2,  HER-2 - (0), ER +95%, PR+ 95%, Ki67 15%. T1 a N0 stage Ia clinical stage  Treatment plan: 1.Breast conserving surgery with sentinel lymph node biopsy 04/30/2019: Grade 1 IDC 0.8 cm, 0/3 lymph nodes, ER 95%, PR 95%, KI 6715%, HER-2 negative, T1BN0 stage Ia 2.Adjuvant radiation therapy 06/02/2019- 3.Follow-up adjuvant antiestrogen therapy with anastrozole 1 mg daily x5 years -------------------------------------------------------------------------------------------------------------------- Current treatment: Adjuvant antiestrogen therapy with anastrozole 1 mg daily to start 07/09/2019 Anastrozole counseling:We discussed the risks and benefits of anti-estrogen therapy with aromatase inhibitors. These include but not limited to insomnia, hot flashes, mood changes, vaginal dryness, bone density loss, and weight gain. We strongly believe that the benefits far outweigh the risks. Patient understands these risks and consented to starting treatment. Planned treatment duration is 5-7 years.  I ordered a baseline bone density test to be done in the next month. Return to clinic in 3 months for survivorship care plan visit    No orders of the defined types were placed in this encounter.  The patient has a good understanding of the overall plan. she agrees with it. she will call with any problems that may develop before  the next visit here.  Nicholas Lose, MD 06/29/2019  Julious Oka Dorshimer am acting as scribe for Dr. Nicholas Lose.  I have reviewed the above documentation for accuracy and completeness, and I agree with the above.

## 2019-06-29 ENCOUNTER — Other Ambulatory Visit: Payer: Self-pay

## 2019-06-29 ENCOUNTER — Inpatient Hospital Stay: Payer: 59 | Attending: Hematology and Oncology | Admitting: Hematology and Oncology

## 2019-06-29 ENCOUNTER — Encounter: Payer: Self-pay | Admitting: *Deleted

## 2019-06-29 ENCOUNTER — Ambulatory Visit: Payer: 59

## 2019-06-29 ENCOUNTER — Ambulatory Visit
Admission: RE | Admit: 2019-06-29 | Discharge: 2019-06-29 | Disposition: A | Discharge status: Home / Self Care | Payer: 59 | Source: Ambulatory Visit | Attending: Radiation Oncology | Admitting: Radiation Oncology

## 2019-06-29 VITALS — BP 156/52 | HR 61 | Temp 98.2°F | Resp 18 | Ht 69.0 in | Wt 241.7 lb

## 2019-06-29 DIAGNOSIS — Z170 Estrogen receptor positive status [ER+]: Secondary | ICD-10-CM | POA: Diagnosis not present

## 2019-06-29 DIAGNOSIS — Z780 Asymptomatic menopausal state: Secondary | ICD-10-CM | POA: Diagnosis not present

## 2019-06-29 DIAGNOSIS — C50512 Malignant neoplasm of lower-outer quadrant of left female breast: Secondary | ICD-10-CM | POA: Insufficient documentation

## 2019-06-29 DIAGNOSIS — Z510 Encounter for antineoplastic radiation therapy: Secondary | ICD-10-CM | POA: Diagnosis not present

## 2019-06-29 MED ORDER — ANASTROZOLE 1 MG PO TABS: 1.0000 mg | ORAL_TABLET | Freq: Every day | ORAL | 3 refills | Status: DC

## 2019-06-29 NOTE — Assessment & Plan Note (Signed)
04/15/2019:Screening mammogram detected 0.4cm mass in the left breast at the 6 o'clock position with no left axillary adenopathy. Biopsy on 04/03/19 showed IDC, grade 1-2, HER-2 - (0), ER +95%, PR+ 95%, Ki67 15%. T1 a N0 stage Ia clinical stage  Treatment plan: 1.Breast conserving surgery with sentinel lymph node biopsy 04/30/2019: Grade 1 IDC 0.8 cm, 0/3 lymph nodes, ER 95%, PR 95%, KI 6715%, HER-2 negative, T1BN0 stage Ia 2.Adjuvant radiation therapy 06/02/2019- 3.Follow-up adjuvant antiestrogen therapy with anastrozole 1 mg daily x5 years -------------------------------------------------------------------------------------------------------------------- Current treatment: Adjuvant antiestrogen therapy with anastrozole 1 mg daily to start 07/09/2019 Anastrozole counseling:We discussed the risks and benefits of anti-estrogen therapy with aromatase inhibitors. These include but not limited to insomnia, hot flashes, mood changes, vaginal dryness, bone density loss, and weight gain. We strongly believe that the benefits far outweigh the risks. Patient understands these risks and consented to starting treatment. Planned treatment duration is 5-7 years.  Return to clinic in 3 months for survivorship care plan visit

## 2019-06-30 ENCOUNTER — Ambulatory Visit
Admission: RE | Admit: 2019-06-30 | Discharge: 2019-06-30 | Disposition: A | Discharge status: Home / Self Care | Payer: 59 | Source: Ambulatory Visit | Attending: Radiation Oncology | Admitting: Radiation Oncology

## 2019-06-30 ENCOUNTER — Encounter: Payer: Self-pay | Admitting: Radiation Oncology

## 2019-06-30 ENCOUNTER — Telehealth: Payer: Self-pay | Admitting: Hematology and Oncology

## 2019-06-30 DIAGNOSIS — Z510 Encounter for antineoplastic radiation therapy: Secondary | ICD-10-CM | POA: Diagnosis not present

## 2019-06-30 NOTE — Telephone Encounter (Signed)
I talk with patient regarding schedule  

## 2019-07-01 ENCOUNTER — Telehealth: Payer: Self-pay | Admitting: *Deleted

## 2019-07-01 NOTE — Telephone Encounter (Signed)
Received call from pt wanting to know what her chance of reoccurrence would be if she were to not take anastrozole antiestrogen therapy.  Per Dr. Lindi Adie, anastrozole reduces pt risk by half for reoccurrence.  RN also educated pt that there are ways to treat certain side effects if pt does start to experience any.  Pt appreciative of the information and verbalized understanding.

## 2019-07-28 ENCOUNTER — Telehealth: Payer: Self-pay | Admitting: Radiation Oncology

## 2019-07-28 NOTE — Telephone Encounter (Signed)
  Radiation Oncology         [phone] ________________________________  Name: Karen Mills MRN: [id number]  Date of Service: 07/30/19  DOB: [birth year]  Post Treatment Telephone Note  Diagnosis:  Stage IA, pT1bN0M0 grade 1, ER/PR positive invasive ductal carcinoma of the left breast  Interval Since Last Radiation:  4 weeks   06/01/2019-06/30/2019:  The left breast was treated to 42.56 Gy in 16 fractions, followed by an 8 Gy boost to the surgical cavity over 4 fractions.  Narrative:  The patient was contacted today for routine follow-up. During treatment she did very well with radiotherapy and did not have significant desquamation. She reports she is contemplating taking anastrozole.  Impression/Plan: 1. Stage IA, pT1bN0M0 grade 1, ER/PR positive invasive ductal carcinoma of the left breast. The patient has been doing well since completion of radiotherapy. We discussed that we would be happy to continue to follow her as needed, but she will also continue to follow up with Dr. Lindi Adie in medical oncology. She was counseled on skin care as well as measures to avoid sun exposure to this area. I transferred her to Dr. Geralyn Flash nurse to discuss some of her questions about anastrozole as well.  2. Survivorship. We discussed the importance of survivorship evaluation and is scheduled to see Survivorship Clinic in January 2021.    Carola Rhine, PAC

## 2019-08-26 NOTE — Progress Notes (Signed)
  Radiation Oncology         [phone]) [phone] ________________________________  Name: Karen Mills MRN: [id number]  Date: 06/30/2019  DOB: 1956-02-21  End of Treatment Note  Diagnosis:   left-sided breast cancer     Indication for treatment:  Curative       Radiation treatment dates:   06/01/19 - 06/30/19  Site/dose:   The patient initially received a dose of 42.56 Gy in 16 fractions to the breast using whole-breast tangent fields. This was delivered using a 3-D conformal technique. The patient then received a boost to the seroma. This delivered an additional 8 Gy in 63fractions using a 3 field photon technique due to the depth of the seroma. The total dose was 50.56 Gy.  Narrative: The patient tolerated radiation treatment relatively well.   The patient had some expected skin irritation as she progressed during treatment. Moist desquamation was not present at the end of treatment.  Plan: The patient has completed radiation treatment. The patient will return to radiation oncology clinic for routine followup in one month. I advised the patient to call or return sooner if they have any questions or concerns related to their recovery or treatment. ________________________________  Jodelle Gross, M.D., Ph.D.

## 2019-09-28 ENCOUNTER — Telehealth: Payer: Self-pay | Admitting: Adult Health

## 2019-09-28 NOTE — Telephone Encounter (Signed)
I talk with patient regarding video visit and reschedule

## 2019-10-01 ENCOUNTER — Encounter: Payer: 59 | Admitting: Adult Health

## 2019-10-14 ENCOUNTER — Ambulatory Visit
Admission: RE | Admit: 2019-10-14 | Discharge: 2019-10-14 | Disposition: A | Discharge status: Home / Self Care | Payer: 59 | Source: Ambulatory Visit | Attending: Hematology and Oncology | Admitting: Hematology and Oncology

## 2019-10-14 ENCOUNTER — Other Ambulatory Visit: Payer: Self-pay

## 2019-10-14 DIAGNOSIS — Z780 Asymptomatic menopausal state: Secondary | ICD-10-CM

## 2019-10-19 ENCOUNTER — Encounter: Payer: 59 | Admitting: Adult Health

## 2019-11-11 ENCOUNTER — Encounter: Payer: Self-pay | Admitting: Adult Health

## 2019-11-11 ENCOUNTER — Inpatient Hospital Stay: Payer: 59 | Attending: Adult Health | Admitting: Adult Health

## 2019-11-11 DIAGNOSIS — Z170 Estrogen receptor positive status [ER+]: Secondary | ICD-10-CM

## 2019-11-11 DIAGNOSIS — C50512 Malignant neoplasm of lower-outer quadrant of left female breast: Secondary | ICD-10-CM | POA: Diagnosis not present

## 2019-11-11 NOTE — Progress Notes (Signed)
SURVIVORSHIP VIRTUAL VISIT:  I connected with Karen Mills on 11/11/19 at 10:30 AM EST by my chart video and later by telephone and verified that I am speaking with the correct person using two identifiers.  I discussed the limitations, risks, security and privacy concerns of performing an evaluation and management service by telephone and the availability of in person appointments. I also discussed with the patient that there may be a patient responsible charge related to this service. The patient expressed understanding and agreed to proceed.   BRIEF ONCOLOGIC HISTORY:  Oncology History  Malignant neoplasm of lower-outer quadrant of left breast of female, estrogen receptor positive ([place])  04/03/2019 Cancer Staging   Staging form: Breast, AJCC 8th Edition - Clinical stage from 04/03/2019: Stage IA (cT1a, cN0, cM0, G2, ER+, PR+, HER2-) - Signed by Gardenia Phlegm, NP on 04/15/2019   04/15/2019 Initial Diagnosis   Screening mammogram detected 0.4cm mass in the left breast at the 6 o'clock position with no left axillary adenopathy. Biopsy on 04/03/19 showed IDC, grade 1-2, HER-2 - (0), ER +95%, PR+ 95%, Ki67 15%.    04/30/2019 Surgery   Left lumpectomy Marlou Starks): IDC, grade 1, 0.8cm, intermediate grade DCIS, lymphovascular invasion present, clear margins. Three left axillary lymph nodes negative for carcinoma.    06/02/2019 -  Radiation Therapy   Adjuvant radiation   07/2019 -  Anti-estrogen oral therapy   Anastrozole daily     INTERVAL HISTORY:  Karen Mills to review her survivorship care plan detailing her treatment course for breast cancer, as well as monitoring long-term side effects of that treatment, education regarding health maintenance, screening, and overall wellness and health promotion.     Overall, Karen Mills reports feeling quite well.  She started Anastrozole in November and Mills.  She stopped the Anastrozole due to hot flashes and achiness.  She was uncomfortable with  the side effects she was having.  She decided to take an estrogen balance medication she got from the [facility].  She feels like she is tolerating the estrogen balance better.    She notes some pulling in her left axilla from time to time.  It is tolerable, and doesn't bother her.  She also notes a hard place on her breast.  She says this has been present since radiation.  REVIEW OF SYSTEMS:  Review of Systems - Oncology Breast: Denies any new nodularity, masses, tenderness, nipple changes, or nipple discharge.      ONCOLOGY TREATMENT TEAM:  1. Surgeon:  Dr. Marlou Starks at [facility] 2. Medical Oncologist: Dr. Lindi Adie  3. Radiation Oncologist: Dr. Lisbeth Renshaw    PAST MEDICAL/SURGICAL HISTORY:  Past Medical History:  Diagnosis Date  . Hyperlipidemia   . Hypertension   . Hypothyroidism   . PONV (postoperative nausea and vomiting)   . Thyroid disease   . Varicose veins    Past Surgical History:  Procedure Laterality Date  . ABLATION ON ENDOMETRIOSIS    . BREAST LUMPECTOMY WITH RADIOACTIVE SEED AND SENTINEL LYMPH NODE BIOPSY Left 04/30/2019   Procedure: LEFT BREAST LUMPECTOMY WITH RADIOACTIVE SEED AND SENTINEL LYMPH NODE BIOPSY;  Surgeon: Jovita Kussmaul, MD;  Location: [facility];  Service: General;  Laterality: Left;  . FOOT SURGERY    . TONSILLECTOMY    . vein removal       ALLERGIES:  Allergies  Allergen Reactions  . Accupril [Quinapril Hcl]     cough  . Amlodipine Besylate     swelling  . Atacand [  Candesartan]   . Cozaar [Losartan Potassium]     Stomach upset   . Maxzide [Hydrochlorothiazide W-Triamterene]   . Tiazac [Diltiazem Hcl Er Beads]   . Zestril [Lisinopril]     Cough      CURRENT MEDICATIONS:  Outpatient Encounter Medications as of 11/11/2019  Medication Sig  . anastrozole (ARIMIDEX) 1 MG tablet Take 1 tablet (1 mg total) by mouth daily.  Marland Kitchen atorvastatin (LIPITOR) 20 MG tablet Take 20 mg by mouth once.   . Biotin (BIOTIN 5000) 5 MG  CAPS Take by mouth daily. 2 capsules once a day.  . bisoprolol-hydrochlorothiazide (ZIAC) 10-6.25 MG tablet Take 1 tablet by mouth daily.  . cholecalciferol (VITAMIN D3) 25 MCG (1000 UT) tablet Take 1,000 Units by mouth daily.  . diclofenac (VOLTAREN) 75 MG EC tablet Take 1 tablet (75 mg total) by mouth 2 (two) times daily.  . furosemide (LASIX) 20 MG tablet Take 20 mg by mouth daily.  . hydrALAZINE (APRESOLINE) 25 MG tablet Take 25 mg by mouth once.   Marland Kitchen HYDROcodone-acetaminophen (NORCO/VICODIN) 5-325 MG tablet Take 1-2 tablets by mouth every 6 (six) hours as needed for moderate pain or severe pain. (Patient not taking: Reported on 05/19/2019)  . levothyroxine (SYNTHROID, LEVOTHROID) 150 MCG tablet Take 1 tablet by mouth daily.  . Omega-3 Fatty Acids (FISH OIL PO) Take 1 tablet by mouth daily.  . OREGANO PO Take 1 tablet by mouth daily.  Marland Kitchen spironolactone (ALDACTONE) 25 MG tablet Take 25 mg by mouth daily.  Marland Kitchen telmisartan (MICARDIS) 80 MG tablet   . Turmeric (QC TUMERIC COMPLEX PO) Take 1,000 mg by mouth daily.  . vitamin E 400 UNIT capsule Take 400 Units by mouth daily.   Marland Kitchen zinc gluconate 50 MG tablet Take 50 mg by mouth daily.   No facility-administered encounter medications on file as of 11/11/2019.     ONCOLOGIC FAMILY HISTORY:  Family History  Problem Relation Age of Onset  . Heart disease Mother   . Heart disease Father   . Heart disease Brother      GENETIC COUNSELING/TESTING: Not at this time  SOCIAL HISTORY:  Social History   Socioeconomic History  . Marital status: Married    Spouse name: Not on file  . Number of children: Not on file  . Years of education: Not on file  . Highest education level: Not on file  Occupational History  . Not on file  Tobacco Use  . Smoking status: Never Smoker  . Smokeless tobacco: Never Used  Substance and Sexual Activity  . Alcohol use: Yes    Comment: 2-3x per week  . Drug use: No  . Sexual activity: Not on file  Other Topics  Concern  . Not on file  Social History Narrative  . Not on file   Social Determinants of Health   Financial Resource Strain:   . Difficulty of Paying Living Expenses: Not on file  Food Insecurity:   . Worried About Charity fundraiser in the Last Year: Not on file  . Ran Out of Food in the Last Year: Not on file  Transportation Needs: No Transportation Needs  . Lack of Transportation (Medical): No  . Lack of Transportation (Non-Medical): No  Physical Activity:   . Days of Exercise per Week: Not on file  . Minutes of Exercise per Session: Not on file  Stress:   . Feeling of Stress : Not on file  Social Connections:   . Frequency of Communication with  Friends and Family: Not on file  . Frequency of Social Gatherings with Friends and Family: Not on file  . Attends Religious Services: Not on file  . Active Member of Clubs or Organizations: Not on file  . Attends Archivist Meetings: Not on file  . Marital Status: Not on file  Intimate Partner Violence:   . Fear of Current or Ex-Partner: Not on file  . Emotionally Abused: Not on file  . Physically Abused: Not on file  . Sexually Abused: Not on file     OBSERVATIONS/OBJECTIVE:  Patient appears well, in no apparent distress.  Mood and behavior are normal.  Breathing is non labored, skin visualized without rash or lesion.    LABORATORY DATA:  None for this visit.  DIAGNOSTIC IMAGING:  None for this visit.      ASSESSMENT AND PLAN:  Ms.. Pitones is a pleasant 64 y.o. female with Stage IA left breast invasive ductal carcinoma, ER+/PR+/HER2-, diagnosed in 04/2019, treated with lumpectomy, adjuvant radiation therapy, and anti-estrogen therapy with Anastrozole beginning in 08/2019 (unable to tolerate).  She presents to the Survivorship Clinic for our initial meeting and routine follow-up post-completion of treatment for breast cancer.    1. Stage IA left breast cancer:  Karen Mills is continuing to recover from  definitive treatment for breast cancer. She will follow-up with her medical oncologist, Dr. Lindi Adie in 2-3 months with history and physical exam per surveillance protocol.  She stopped taking Anastrozole and is taking estrogen balance.  I am not sure what is in this because it is a supplement not monitored by the FDA.  I asked that she make sure that it does not have any estrogens in it.  Also, I reviewed with her the potential side effects of anti estrogen therapy and that not taking therapy will increase the likelihood of breast cancer recurrence by one third.  She understands and is going to read more into Letrozole and Exemestane and will let me know if she decides to try a different anti estrogen therapy.   Her mammogram is due 03/2020; orders placed today. Today, a comprehensive survivorship care plan and treatment summary was reviewed with the patient today detailing her breast cancer diagnosis, treatment course, potential late/long-term effects of treatment, appropriate follow-up care with recommendations for the future, and patient education resources.  A copy of this summary, along with a letter will be sent to the patient's primary care provider via mail/fax/In Basket message after today's visit.    2. Bone health:  Given Ms. Scholler's age/history of breast cancer, she is at risk for bone demineralization.  Her last DEXA scan was in 10/2019, which was normal.   She was given education on specific activities to promote bone health.  3. Cancer screening:  Due to Ms. Deridder's history and her age, she should receive screening for skin cancers, colon cancer, and gynecologic cancers.  The information and recommendations are listed on the patient's comprehensive care plan/treatment summary and were reviewed in detail with the patient.    4. Health maintenance and wellness promotion: Ms. Kysar was encouraged to consume 5-7 servings of fruits and vegetables per day. We reviewed the "Nutrition Rainbow"  handout, as well as the handout "Take Control of Your Health and Reduce Your Cancer Risk" from the [place].  She was also encouraged to engage in moderate to vigorous exercise for 30 minutes per day most days of the week. We discussed the [employer] fitness program, which is designed  for cancer survivors to help them become more physically fit after cancer treatments.  She was instructed to limit her alcohol consumption and continue to abstain from tobacco use.     5. Support services/counseling: It is not uncommon for this period of the patient's cancer care trajectory to be one of many emotions and stressors.  We discussed how this can be increasingly difficult during the times of quarantine and social distancing due to the COVID-19 pandemic.   She was given information regarding our available services and encouraged to contact me with any questions or for help enrolling in any of our support group/programs.    Follow up instructions:    -Return to cancer center for f/u with Dr. Lindi Adie in 2-3 months  -Mammogram due in 03/2020 -Follow up with surgery in 07/2020 -She is welcome to return back to the Survivorship Clinic at any time; no additional follow-up needed at this time.  -Consider referral back to survivorship as a long-term survivor for continued surveillance  The patient was provided an opportunity to ask questions and all were answered. The patient agreed with the plan and demonstrated an understanding of the instructions.   The patient was advised to call back or seek an in-person evaluation if the symptoms worsen or if the condition fails to improve as anticipated.   I provided 30 minutes of face-to-face video visit time during this encounter, and > 50% was spent counseling as documented under my assessment & plan.  Scot Dock, NP

## 2019-11-12 ENCOUNTER — Telehealth: Payer: Self-pay | Admitting: Hematology and Oncology

## 2019-11-12 NOTE — Telephone Encounter (Signed)
I talk with patient regarding schedule  

## 2019-12-23 ENCOUNTER — Other Ambulatory Visit: Payer: Self-pay | Admitting: Podiatry

## 2020-01-10 NOTE — Progress Notes (Signed)
Patient Care Team: Lawerance Cruel, MD as PCP - General (Family Medicine) Nicholas Lose, MD as Consulting Physician (Hematology and Oncology) Kyung Rudd, MD as Consulting Physician (Radiation Oncology) Jovita Kussmaul, MD as Consulting Physician (General Surgery)  DIAGNOSIS:    ICD-10-CM   1. Malignant neoplasm of lower-outer quadrant of left breast of female, estrogen receptor positive ([place])  C50.512    Z17.0     SUMMARY OF ONCOLOGIC HISTORY: Oncology History  Malignant neoplasm of lower-outer quadrant of left breast of female, estrogen receptor positive ([place])  04/03/2019 Cancer Staging   Staging form: Breast, AJCC 8th Edition - Clinical stage from 04/03/2019: Stage IA (cT1a, cN0, cM0, G2, ER+, PR+, HER2-) - Signed by Gardenia Phlegm, NP on 04/15/2019   04/15/2019 Initial Diagnosis   Screening mammogram detected 0.4cm mass in the left breast at the 6 o'clock position with no left axillary adenopathy. Biopsy on 04/03/19 showed IDC, grade 1-2, HER-2 - (0), ER +95%, PR+ 95%, Ki67 15%.    04/30/2019 Surgery   Left lumpectomy Marlou Starks): IDC, grade 1, 0.8cm, intermediate grade DCIS, lymphovascular invasion present, clear margins. Three left axillary lymph nodes negative for carcinoma.    06/02/2019 -  Radiation Therapy   Adjuvant radiation   08/2019 - 10/2019 Anti-estrogen oral therapy   Anastrozole daily     CHIEF COMPLIANT: Follow-up of right breast cancer on anastrozole  INTERVAL HISTORY: Karen Mills is a 64 y.o. with above-mentioned history of right breast cancer who underwent a lumpectomy, radiation, and is currently on surveillance after she could not tolerate antiestrogen therapy with anastrozole. She presents to the clinic today for follow-up.    She complains of fatigue and severe joint pains because of which she stopped anastrozole therapy.  She also tells me that she is taking estrogen supplement to make her feel better.  She is concerned about her weight gain as  well as lack of energy.  ALLERGIES:  is allergic to accupril [quinapril hcl]; amlodipine besylate; atacand [candesartan]; cozaar [losartan potassium]; maxzide [hydrochlorothiazide w-triamterene]; tiazac [diltiazem hcl er beads]; and zestril [lisinopril].  MEDICATIONS:  Current Outpatient Medications  Medication Sig Dispense Refill  . anastrozole (ARIMIDEX) 1 MG tablet Take 1 tablet (1 mg total) by mouth daily. 90 tablet 3  . atorvastatin (LIPITOR) 20 MG tablet Take 20 mg by mouth once.   4  . Biotin (BIOTIN 5000) 5 MG CAPS Take by mouth daily. 2 capsules once a day.    . bisoprolol-hydrochlorothiazide (ZIAC) 10-6.25 MG tablet Take 1 tablet by mouth daily.  4  . cholecalciferol (VITAMIN D3) 25 MCG (1000 UT) tablet Take 1,000 Units by mouth daily.    . diclofenac (VOLTAREN) 75 MG EC tablet Take 1 tablet (75 mg total) by mouth 2 (two) times daily. 50 tablet 2  . furosemide (LASIX) 20 MG tablet Take 20 mg by mouth daily.    . hydrALAZINE (APRESOLINE) 25 MG tablet Take 25 mg by mouth once.   3  . HYDROcodone-acetaminophen (NORCO/VICODIN) 5-325 MG tablet Take 1-2 tablets by mouth every 6 (six) hours as needed for moderate pain or severe pain. (Patient not taking: Reported on 05/19/2019) 15 tablet 0  . levothyroxine (SYNTHROID, LEVOTHROID) 150 MCG tablet Take 1 tablet by mouth daily.  4  . Omega-3 Fatty Acids (FISH OIL PO) Take 1 tablet by mouth daily.    . OREGANO PO Take 1 tablet by mouth daily.    Marland Kitchen spironolactone (ALDACTONE) 25 MG tablet Take 25 mg by mouth daily.    Marland Kitchen  telmisartan (MICARDIS) 80 MG tablet     . Turmeric (QC TUMERIC COMPLEX PO) Take 1,000 mg by mouth daily.    . vitamin E 400 UNIT capsule Take 400 Units by mouth daily.     Marland Kitchen zinc gluconate 50 MG tablet Take 50 mg by mouth daily.     No current facility-administered medications for this visit.    PHYSICAL EXAMINATION: ECOG PERFORMANCE STATUS: 1 - Symptomatic but completely ambulatory  Vitals:   01/11/20 1154  BP: (!) 150/54   Pulse: (!) 54  Resp: 17  Temp: 98 F (36.7 C)  SpO2: 100%   Filed Weights   01/11/20 1154  Weight: 241 lb 14.4 oz (109.7 kg)    LABORATORY DATA:  I have reviewed the data as listed CMP Latest Ref Rng & Units 04/29/2019 06/24/2008  Glucose 70 - 99 mg/dL 99 99  BUN 8 - 23 mg/dL 31(H) 10  Creatinine 0.44 - 1.00 mg/dL 1.66(H) 0.72  Sodium 135 - 145 mmol/L 136 135  Potassium 3.5 - 5.1 mmol/L 4.5 3.7  Chloride 98 - 111 mmol/L 105 99  CO2 22 - 32 mmol/L 21(L) 28  Calcium 8.9 - 10.3 mg/dL 9.6 9.2    Lab Results  Component Value Date   WBC 6.3 06/24/2008   HGB 13.7 06/24/2008   HCT 40.1 06/24/2008   MCV 102.5 (H) 06/24/2008   PLT 340 06/24/2008    ASSESSMENT & PLAN:  Malignant neoplasm of lower-outer quadrant of left breast of female, estrogen receptor positive ([place]) 04/15/2019:Screening mammogram detected 0.4cm mass in the left breast at the 6 o'clock position with no left axillary adenopathy. Biopsy on 04/03/19 showed IDC, grade 1-2, HER-2 - (0), ER +95%, PR+ 95%, Ki67 15%. T1 a N0 stage Ia clinical stage  Treatment plan: 1.Breast conserving surgery with sentinel lymph node biopsy7/23/2020: Grade 1 IDC 0.8 cm, 0/3 lymph nodes, ER 95%, PR 95%, KI 6715%, HER-2 negative, T1BN0 stage Ia 2.Adjuvant radiation therapy 06/02/2019- 3.Follow-up adjuvant antiestrogen therapy with anastrozole 1 mg daily x5 years -------------------------------------------------------------------------------------------------------------------- Current treatment: Adjuvant antiestrogen therapy with anastrozole 1 mg daily to start 07/09/2019 Anastrozole toxicities: Severe joint aches and pains and hot flashes.  She stopped it and her symptoms improved. I recommended that she take tamoxifen 10 mg daily.  We discussed risks and benefits of tamoxifen.  Further weight gain: I recommended intermittent fasting. For energy we discussed the role of B12 supplementation.  Bone density 10/14/2019: T score -0.1:  Normal We will see her back in 1 month to assess tolerability to tamoxifen.   No orders of the defined types were placed in this encounter.  The patient has a good understanding of the overall plan. she agrees with it. she will call with any problems that may develop before the next visit here.  Total time spent: 30 mins including face to face time and time spent for planning, charting and coordination of care  Nicholas Lose, MD 01/11/2020  I, Cloyde Reams Dorshimer, am acting as scribe for Dr. Nicholas Lose.  I have reviewed the above documentation for accuracy and completeness, and I agree with the above.

## 2020-01-11 ENCOUNTER — Other Ambulatory Visit: Payer: Self-pay

## 2020-01-11 ENCOUNTER — Inpatient Hospital Stay: Payer: 59 | Attending: Adult Health | Admitting: Hematology and Oncology

## 2020-01-11 DIAGNOSIS — Z79811 Long term (current) use of aromatase inhibitors: Secondary | ICD-10-CM | POA: Insufficient documentation

## 2020-01-11 DIAGNOSIS — R635 Abnormal weight gain: Secondary | ICD-10-CM | POA: Diagnosis not present

## 2020-01-11 DIAGNOSIS — C50512 Malignant neoplasm of lower-outer quadrant of left female breast: Secondary | ICD-10-CM

## 2020-01-11 DIAGNOSIS — M2550 Pain in unspecified joint: Secondary | ICD-10-CM | POA: Insufficient documentation

## 2020-01-11 DIAGNOSIS — Z170 Estrogen receptor positive status [ER+]: Secondary | ICD-10-CM | POA: Diagnosis not present

## 2020-01-11 MED ORDER — DICLOFENAC SODIUM 75 MG PO TBEC: 75.0000 mg | DELAYED_RELEASE_TABLET | Freq: Every day | ORAL | 2 refills | Status: DC | PRN

## 2020-01-11 MED ORDER — TAMOXIFEN CITRATE 10 MG PO TABS: 10.0000 mg | ORAL_TABLET | Freq: Every day | ORAL | 0 refills | Status: DC

## 2020-01-11 NOTE — Assessment & Plan Note (Signed)
04/15/2019:Screening mammogram detected 0.4cm mass in the left breast at the 6 o'clock position with no left axillary adenopathy. Biopsy on 04/03/19 showed IDC, grade 1-2, HER-2 - (0), ER +95%, PR+ 95%, Ki67 15%. T1 a N0 stage Ia clinical stage  Treatment plan: 1.Breast conserving surgery with sentinel lymph node biopsy7/23/2020: Grade 1 IDC 0.8 cm, 0/3 lymph nodes, ER 95%, PR 95%, KI 6715%, HER-2 negative, T1BN0 stage Ia 2.Adjuvant radiation therapy 06/02/2019- 3.Follow-up adjuvant antiestrogen therapy with anastrozole 1 mg daily x5 years -------------------------------------------------------------------------------------------------------------------- Current treatment: Adjuvant antiestrogen therapy with anastrozole 1 mg daily to start 07/09/2019 Anastrozole toxicities:  Bone density 10/14/2019: T score -0.1: Normal  Return to clinic in 1 year for follow-up

## 2020-01-14 ENCOUNTER — Telehealth: Payer: Self-pay | Admitting: Hematology and Oncology

## 2020-01-14 NOTE — Telephone Encounter (Signed)
Scheduled per 04/05 los, patient has been called and notified. ?

## 2020-02-05 ENCOUNTER — Other Ambulatory Visit: Payer: Self-pay | Admitting: Hematology and Oncology

## 2020-02-08 NOTE — Progress Notes (Signed)
Patient Care Team: Lawerance Cruel, MD as PCP - General (Family Medicine) Nicholas Lose, MD as Consulting Physician (Hematology and Oncology) Kyung Rudd, MD as Consulting Physician (Radiation Oncology) Jovita Kussmaul, MD as Consulting Physician (General Surgery)  DIAGNOSIS:    ICD-10-CM   1. Malignant neoplasm of lower-outer quadrant of left breast of female, estrogen receptor positive ([place])  C50.512    Z17.0     SUMMARY OF ONCOLOGIC HISTORY: Oncology History  Malignant neoplasm of lower-outer quadrant of left breast of female, estrogen receptor positive ([place])  04/03/2019 Cancer Staging   Staging form: Breast, AJCC 8th Edition - Clinical stage from 04/03/2019: Stage IA (cT1a, cN0, cM0, G2, ER+, PR+, HER2-) - Signed by Gardenia Phlegm, NP on 04/15/2019   04/15/2019 Initial Diagnosis   Screening mammogram detected 0.4cm mass in the left breast at the 6 o'clock position with no left axillary adenopathy. Biopsy on 04/03/19 showed IDC, grade 1-2, HER-2 - (0), ER +95%, PR+ 95%, Ki67 15%.    04/30/2019 Surgery   Left lumpectomy Marlou Starks): IDC, grade 1, 0.8cm, intermediate grade DCIS, lymphovascular invasion present, clear margins. Three left axillary lymph nodes negative for carcinoma.    06/02/2019 -  Radiation Therapy   Adjuvant radiation   08/2019 -  Anti-estrogen oral therapy   Anastrozole daily stopped 10/2019 due to severe joint pain; switched to tamoxifen 58m on 01/11/20     CHIEF COMPLIANT: Follow-up of right breast cancer on tamoxifen  INTERVAL HISTORY: Karen Mills a 64y.o. with above-mentioned history of right breast cancer who underwent a lumpectomy, radiation, and is currently on antiestrogen therapy with tamoxifen.She presents to the clinic today for follow-up. She is tolerating tamoxifen 10 mg daily extremely well and wants to stay on this dosage.  She has had very mild hot flashes.  ALLERGIES:  is allergic to accupril [quinapril hcl]; amlodipine besylate;  atacand [candesartan]; cozaar [losartan potassium]; maxzide [hydrochlorothiazide w-triamterene]; tiazac [diltiazem hcl er beads]; and zestril [lisinopril].  MEDICATIONS:  Current Outpatient Medications  Medication Sig Dispense Refill  . atorvastatin (LIPITOR) 20 MG tablet Take 20 mg by mouth once.   4  . Biotin (BIOTIN 5000) 5 MG CAPS Take by mouth daily. 2 capsules once a day.    . bisoprolol-hydrochlorothiazide (ZIAC) 10-6.25 MG tablet Take 1 tablet by mouth daily.  4  . cholecalciferol (VITAMIN D3) 25 MCG (1000 UT) tablet Take 1,000 Units by mouth daily.    . diclofenac (VOLTAREN) 75 MG EC tablet Take 1 tablet (75 mg total) by mouth daily as needed. 50 tablet 2  . furosemide (LASIX) 20 MG tablet Take 20 mg by mouth daily.    . hydrALAZINE (APRESOLINE) 25 MG tablet Take 25 mg by mouth once.   3  . levothyroxine (SYNTHROID, LEVOTHROID) 150 MCG tablet Take 1 tablet by mouth daily.  4  . Omega-3 Fatty Acids (FISH OIL PO) Take 1 tablet by mouth daily.    . OREGANO PO Take 1 tablet by mouth daily.    .Marland Kitchenspironolactone (ALDACTONE) 25 MG tablet Take 25 mg by mouth daily.    . tamoxifen (NOLVADEX) 10 MG tablet TAKE 1 TABLET BY MOUTH EVERY DAY 90 tablet 0  . telmisartan (MICARDIS) 80 MG tablet     . Turmeric (QC TUMERIC COMPLEX PO) Take 1,000 mg by mouth daily.    . vitamin E 400 UNIT capsule Take 400 Units by mouth daily.      No current facility-administered medications for this visit.  PHYSICAL EXAMINATION: ECOG PERFORMANCE STATUS: 1 - Symptomatic but completely ambulatory  Vitals:   02/09/20 1015  BP: 130/75  Pulse: (!) 58  Resp: 17  Temp: 98.3 F (36.8 C)  SpO2: 99%   Filed Weights   02/09/20 1015  Weight: 243 lb (110.2 kg)    LABORATORY DATA:  I have reviewed the data as listed CMP Latest Ref Rng & Units 04/29/2019 06/24/2008  Glucose 70 - 99 mg/dL 99 99  BUN 8 - 23 mg/dL 31(H) 10  Creatinine 0.44 - 1.00 mg/dL 1.66(H) 0.72  Sodium 135 - 145 mmol/L 136 135  Potassium 3.5  - 5.1 mmol/L 4.5 3.7  Chloride 98 - 111 mmol/L 105 99  CO2 22 - 32 mmol/L 21(L) 28  Calcium 8.9 - 10.3 mg/dL 9.6 9.2    Lab Results  Component Value Date   WBC 6.3 06/24/2008   HGB 13.7 06/24/2008   HCT 40.1 06/24/2008   MCV 102.5 (H) 06/24/2008   PLT 340 06/24/2008    ASSESSMENT & PLAN:  Malignant neoplasm of lower-outer quadrant of left breast of female, estrogen receptor positive ([place]) 04/15/2019:Screening mammogram detected 0.4cm mass in the left breast at the 6 o'clock position with no left axillary adenopathy. Biopsy on 04/03/19 showed IDC, grade 1-2, HER-2 - (0), ER +95%, PR+ 95%, Ki67 15%. T1 a N0 stage Ia clinical stage  Treatment plan: 1.Breast conserving surgery with sentinel lymph node biopsy7/23/2020: Grade 1 IDC 0.8 cm, 0/3 lymph nodes, ER 95%, PR 95%, KI 6715%, HER-2 negative, T1BN0 stage Ia 2.Adjuvant radiation therapy8/25/2020- 3.Follow-up adjuvant antiestrogen therapy with anastrozole 1 mg daily x5 years -------------------------------------------------------------------------------------------------------------------- Current treatment:Adjuvant antiestrogen therapy with anastrozole 1 mg daily started 07/09/2019 (discontinued due to severe joint aches and pains) switched to tamoxifen 10 mg started 01/11/2020 . Tamoxifen toxicities:  Very occasional hot flashes. I discussed with her about long distance car rides and risk of blood clots with tamoxifen.  Further weight gain: I recommended intermittent fasting. For energy we discussed the role of B12 supplementation.  Bone density 10/14/2019: T score -0.1: Normal We will see her back in 6 months for follow-up and after that we can see her once a year.    No orders of the defined types were placed in this encounter.  The patient has a good understanding of the overall plan. she agrees with it. she will call with any problems that may develop before the next visit here.  Total time spent: 20 mins including  face to face time and time spent for planning, charting and coordination of care  Nicholas Lose, MD 02/09/2020  I, Cloyde Reams Dorshimer, am acting as scribe for Dr. Nicholas Lose.  I have reviewed the above documentation for accuracy and completeness, and I agree with the above.

## 2020-02-09 ENCOUNTER — Other Ambulatory Visit: Payer: Self-pay

## 2020-02-09 ENCOUNTER — Inpatient Hospital Stay: Payer: 59 | Attending: Adult Health | Admitting: Hematology and Oncology

## 2020-02-09 DIAGNOSIS — Z170 Estrogen receptor positive status [ER+]: Secondary | ICD-10-CM | POA: Diagnosis not present

## 2020-02-09 DIAGNOSIS — Z923 Personal history of irradiation: Secondary | ICD-10-CM | POA: Diagnosis not present

## 2020-02-09 DIAGNOSIS — C50512 Malignant neoplasm of lower-outer quadrant of left female breast: Secondary | ICD-10-CM | POA: Diagnosis not present

## 2020-02-09 DIAGNOSIS — N951 Menopausal and female climacteric states: Secondary | ICD-10-CM | POA: Insufficient documentation

## 2020-02-09 DIAGNOSIS — R635 Abnormal weight gain: Secondary | ICD-10-CM | POA: Insufficient documentation

## 2020-02-09 MED ORDER — DICLOFENAC SODIUM 75 MG PO TBEC: 75.0000 mg | DELAYED_RELEASE_TABLET | ORAL | 2 refills | Status: DC

## 2020-02-09 NOTE — Assessment & Plan Note (Signed)
04/15/2019:Screening mammogram detected 0.4cm mass in the left breast at the 6 o'clock position with no left axillary adenopathy. Biopsy on 04/03/19 showed IDC, grade 1-2, HER-2 - (0), ER +95%, PR+ 95%, Ki67 15%. T1 a N0 stage Ia clinical stage  Treatment plan: 1.Breast conserving surgery with sentinel lymph node biopsy7/23/2020: Grade 1 IDC 0.8 cm, 0/3 lymph nodes, ER 95%, PR 95%, KI 6715%, HER-2 negative, T1BN0 stage Ia 2.Adjuvant radiation therapy8/25/2020- 3.Follow-up adjuvant antiestrogen therapy with anastrozole 1 mg daily x5 years -------------------------------------------------------------------------------------------------------------------- Current treatment:Adjuvant antiestrogen therapy with anastrozole 1 mg daily started 07/09/2019 (discontinued due to severe joint aches and pains) switched to tamoxifen 10 mg started 01/11/2020 . Tamoxifen toxicities:     Further weight gain: I recommended intermittent fasting. For energy we discussed the role of B12 supplementation.  Bone density 10/14/2019: T score -0.1: Normal We will see her back in 6 months for follow-up

## 2020-03-29 ENCOUNTER — Ambulatory Visit
Admission: RE | Admit: 2020-03-29 | Discharge: 2020-03-29 | Disposition: A | Discharge status: Home / Self Care | Payer: 59 | Source: Ambulatory Visit | Attending: Hematology and Oncology | Admitting: Hematology and Oncology

## 2020-03-29 ENCOUNTER — Other Ambulatory Visit: Payer: Self-pay

## 2020-03-29 DIAGNOSIS — C50512 Malignant neoplasm of lower-outer quadrant of left female breast: Secondary | ICD-10-CM

## 2020-06-05 ENCOUNTER — Other Ambulatory Visit: Payer: Self-pay | Admitting: Hematology and Oncology

## 2020-06-29 ENCOUNTER — Other Ambulatory Visit: Payer: Self-pay | Admitting: Hematology and Oncology

## 2020-07-16 ENCOUNTER — Ambulatory Visit (HOSPITAL_COMMUNITY)
Admission: RE | Admit: 2020-07-16 | Discharge: 2020-07-16 | Disposition: A | Discharge status: Home / Self Care | Payer: 59 | Source: Ambulatory Visit | Attending: Pulmonary Disease | Admitting: Pulmonary Disease

## 2020-07-16 ENCOUNTER — Other Ambulatory Visit (HOSPITAL_COMMUNITY): Payer: Self-pay | Admitting: Adult Health

## 2020-07-16 ENCOUNTER — Encounter: Payer: Self-pay | Admitting: Adult Health

## 2020-07-16 DIAGNOSIS — U071 COVID-19: Secondary | ICD-10-CM | POA: Diagnosis not present

## 2020-07-16 MED ORDER — DIPHENHYDRAMINE HCL 50 MG/ML IJ SOLN: 50.0000 mg | Freq: Once | INTRAMUSCULAR | Status: DC | PRN

## 2020-07-16 MED ORDER — FAMOTIDINE IN NACL 20-0.9 MG/50ML-% IV SOLN: 20.0000 mg | Freq: Once | INTRAVENOUS | Status: DC | PRN

## 2020-07-16 MED ORDER — ALBUTEROL SULFATE HFA 108 (90 BASE) MCG/ACT IN AERS: 2.0000 | INHALATION_SPRAY | Freq: Once | RESPIRATORY_TRACT | Status: DC | PRN

## 2020-07-16 MED ORDER — EPINEPHRINE 0.3 MG/0.3ML IJ SOAJ: 0.3000 mg | Freq: Once | INTRAMUSCULAR | Status: DC | PRN

## 2020-07-16 MED ORDER — METHYLPREDNISOLONE SODIUM SUCC 125 MG IJ SOLR: 125.0000 mg | Freq: Once | INTRAMUSCULAR | Status: DC | PRN

## 2020-07-16 MED ORDER — SODIUM CHLORIDE 0.9 % IV SOLN: INTRAVENOUS | Status: DC | PRN

## 2020-07-16 MED ORDER — SODIUM CHLORIDE 0.9 % IV SOLN: Freq: Once | INTRAVENOUS | Status: DC

## 2020-07-16 MED ORDER — SODIUM CHLORIDE 0.9 % IV SOLN: Freq: Once | INTRAVENOUS | Status: AC

## 2020-07-16 NOTE — Discharge Instructions (Signed)

## 2020-07-16 NOTE — Progress Notes (Signed)
  Diagnosis: COVID-19  Physician: Dr. Joya Gaskins   Procedure: [facility] Infusion [facility] Med: casirivimab\imdevimab infusion - Provided patient with casirivimab\imdevimab fact sheet for patients, parents and caregivers prior to infusion.  Complications: No immediate complications noted.  Discharge: Discharged home   Karen Mills 07/16/2020

## 2020-07-16 NOTE — Progress Notes (Signed)
I connected by phone with Scherry Ran on 07/16/2020 at 7:49 AM to discuss the potential use of a new treatment for mild to moderate COVID-19 viral infection in non-hospitalized patients.  This patient is a 64 y.o. female that meets the FDA criteria for Emergency Use Authorization of COVID monoclonal antibody casirivimab/imdevimab or bamlanivimab/eteseviamb.  Has a (+) direct SARS-CoV-2 viral test result  Has mild or moderate COVID-19   Is NOT hospitalized due to COVID-19  Is within 10 days of symptom onset  Has at least one of the high risk factor(s) for progression to severe COVID-19 and/or hospitalization as defined in EUA.  Specific high risk criteria : Older age (>/= 65 yo)   Sx onset 07/11/2020   I have spoken and communicated the following to the patient or parent/caregiver regarding COVID monoclonal antibody treatment:  1. FDA has authorized the emergency use for the treatment of mild to moderate COVID-19 in adults and pediatric patients with positive results of direct SARS-CoV-2 viral testing who are 34 years of age and older weighing at least 40 kg, and who are at high risk for progressing to severe COVID-19 and/or hospitalization.  2. The significant known and potential risks and benefits of COVID monoclonal antibody, and the extent to which such potential risks and benefits are unknown.  3. Information on available alternative treatments and the risks and benefits of those alternatives, including clinical trials.  4. Patients treated with COVID monoclonal antibody should continue to self-isolate and use infection control measures (e.g., wear mask, isolate, social distance, avoid sharing personal items, clean and disinfect high touch surfaces, and frequent handwashing) according to CDC guidelines.   5. The patient or parent/caregiver has the option to accept or refuse COVID monoclonal antibody treatment.  After reviewing this information with the patient, the patient has  agreed to receive one of the available covid 19 monoclonal antibodies and will be provided an appropriate fact sheet prior to infusion. Scot Dock, NP 07/16/2020 7:49 AM

## 2020-07-19 ENCOUNTER — Other Ambulatory Visit (HOSPITAL_COMMUNITY): Payer: Self-pay

## 2020-08-11 NOTE — Progress Notes (Signed)
Patient Care Team: Lawerance Cruel, MD as PCP - General (Family Medicine) Nicholas Lose, MD as Consulting Physician (Hematology and Oncology) Kyung Rudd, MD as Consulting Physician (Radiation Oncology) Jovita Kussmaul, MD as Consulting Physician (General Surgery)  DIAGNOSIS:    ICD-10-CM   1. Malignant neoplasm of lower-outer quadrant of left breast of female, estrogen receptor positive ([place])  C50.512    Z17.0     SUMMARY OF ONCOLOGIC HISTORY: Oncology History  Malignant neoplasm of lower-outer quadrant of left breast of female, estrogen receptor positive ([place])  04/03/2019 Cancer Staging   Staging form: Breast, AJCC 8th Edition - Clinical stage from 04/03/2019: Stage IA (cT1a, cN0, cM0, G2, ER+, PR+, HER2-) - Signed by Gardenia Phlegm, NP on 04/15/2019   04/15/2019 Initial Diagnosis   Screening mammogram detected 0.4cm mass in the left breast at the 6 o'clock position with no left axillary adenopathy. Biopsy on 04/03/19 showed IDC, grade 1-2, HER-2 - (0), ER +95%, PR+ 95%, Ki67 15%.    04/30/2019 Surgery   Left lumpectomy Marlou Starks): IDC, grade 1, 0.8cm, intermediate grade DCIS, lymphovascular invasion present, clear margins. Three left axillary lymph nodes negative for carcinoma.    06/02/2019 -  Radiation Therapy   Adjuvant radiation   08/2019 -  Anti-estrogen oral therapy   Anastrozole daily stopped 10/2019 due to severe joint pain; switched to tamoxifen 39m on 01/11/20     CHIEF COMPLIANT: Follow-upof right breast cancer on tamoxifen  INTERVAL HISTORY: Karen MEISERis a 64y.o. with above-mentioned history of right breast cancer who underwent a lumpectomy,radiation, and is currently on antiestrogen therapy with tamoxifen.Mammogram on 03/29/20 showed no evidence of malignancy bilaterally. She presents to the clinic todayfor follow-up.  ALLERGIES:  is allergic to accupril [quinapril hcl], amlodipine besylate, atacand [candesartan], cozaar [losartan potassium], maxzide  [hydrochlorothiazide w-triamterene], tiazac [diltiazem hcl er beads], and zestril [lisinopril].  MEDICATIONS:  Current Outpatient Medications  Medication Sig Dispense Refill  . Biotin (BIOTIN 5000) 5 MG CAPS Take by mouth daily. 2 capsules once a day.    . cholecalciferol (VITAMIN D3) 25 MCG (1000 UT) tablet Take 1,000 Units by mouth daily.    . furosemide (LASIX) 20 MG tablet Take 20 mg by mouth daily.    . hydrALAZINE (APRESOLINE) 25 MG tablet Take 25 mg by mouth once.   3  . levothyroxine (SYNTHROID, LEVOTHROID) 150 MCG tablet Take 1 tablet by mouth daily.  4  . Omega-3 Fatty Acids (FISH OIL PO) Take 1 tablet by mouth daily.    . OREGANO PO Take 1 tablet by mouth daily.    . tamoxifen (NOLVADEX) 10 MG tablet TAKE 1 TABLET BY MOUTH EVERY DAY 90 tablet 3  . Turmeric (QC TUMERIC COMPLEX PO) Take 1,000 mg by mouth daily.    . vitamin E 400 UNIT capsule Take 400 Units by mouth daily.      No current facility-administered medications for this visit.    PHYSICAL EXAMINATION: ECOG PERFORMANCE STATUS: 1 - Symptomatic but completely ambulatory  Vitals:   08/12/20 1039  BP: (!) 172/78  Pulse: 65  Resp: 18  Temp: (!) 97.2 F (36.2 C)  SpO2: 99%   Filed Weights   08/12/20 1039  Weight: 233 lb 9.6 oz (106 kg)    BREAST: No palpable masses or nodules in either right or left breasts. No palpable axillary supraclavicular or infraclavicular adenopathy no breast tenderness or nipple discharge. (exam performed in the presence of a chaperone)  LABORATORY DATA:  I have  reviewed the data as listed CMP Latest Ref Rng & Units 04/29/2019 06/24/2008  Glucose 70 - 99 mg/dL 99 99  BUN 8 - 23 mg/dL 31(H) 10  Creatinine 0.44 - 1.00 mg/dL 1.66(H) 0.72  Sodium 135 - 145 mmol/L 136 135  Potassium 3.5 - 5.1 mmol/L 4.5 3.7  Chloride 98 - 111 mmol/L 105 99  CO2 22 - 32 mmol/L 21(L) 28  Calcium 8.9 - 10.3 mg/dL 9.6 9.2    Lab Results  Component Value Date   WBC 6.3 06/24/2008   HGB 13.7 06/24/2008     HCT 40.1 06/24/2008   MCV 102.5 (H) 06/24/2008   PLT 340 06/24/2008    ASSESSMENT & PLAN:  Malignant neoplasm of lower-outer quadrant of left breast of female, estrogen receptor positive ([place]) 04/15/2019:Screening mammogram detected 0.4cm mass in the left breast at the 6 o'clock position with no left axillary adenopathy. Biopsy on 04/03/19 showed IDC, grade 1-2, HER-2 - (0), ER +95%, PR+ 95%, Ki67 15%. T1 a N0 stage Ia clinical stage  Treatment plan: 1.Breast conserving surgery with sentinel lymph node biopsy7/23/2020: Grade 1 IDC 0.8 cm, 0/3 lymph nodes, ER 95%, PR 95%, KI 6715%, HER-2 negative, T1BN0 stage Ia 2.Adjuvant radiation therapy8/25/2020- 3.Follow-up adjuvant antiestrogen therapy with anastrozole 1 mg daily x5 years -------------------------------------------------------------------------------------------------------------------- Current treatment:Adjuvant antiestrogen therapy with anastrozole 1 mg daily started 07/09/2019 (discontinued due to severe joint aches and pains) switched to tamoxifen 10 mg started 01/11/2020 .  Tamoxifentoxicities: Very occasional hot flashes. I discussed with her about long distance car rides and risk of blood clots with tamoxifen.  Further weight gain: I recommended intermittent fasting. For energy we discussed the role of B12 supplementation.  Bone density 10/14/2019: T score -0.1: Normal Mammogram 03/29/2020: Benign breast density category B Breast exam 08/12/2020: Benign  Return to clinic once a year for follow-up    No orders of the defined types were placed in this encounter.  The patient has a good understanding of the overall plan. she agrees with it. she will call with any problems that may develop before the next visit here.  Total time spent: 20 mins including face to face time and time spent for planning, charting and coordination of care  Nicholas Lose, MD 08/12/2020  I, Cloyde Reams Dorshimer, am acting as scribe for Dr.  Nicholas Lose.  I have reviewed the above documentation for accuracy and completeness, and I agree with the above.

## 2020-08-12 ENCOUNTER — Inpatient Hospital Stay: Payer: 59 | Attending: Hematology and Oncology | Admitting: Hematology and Oncology

## 2020-08-12 ENCOUNTER — Other Ambulatory Visit: Payer: Self-pay

## 2020-08-12 DIAGNOSIS — C50512 Malignant neoplasm of lower-outer quadrant of left female breast: Secondary | ICD-10-CM | POA: Insufficient documentation

## 2020-08-12 DIAGNOSIS — Z170 Estrogen receptor positive status [ER+]: Secondary | ICD-10-CM | POA: Diagnosis not present

## 2020-08-12 NOTE — Assessment & Plan Note (Signed)
04/15/2019:Screening mammogram detected 0.4cm mass in the left breast at the 6 o'clock position with no left axillary adenopathy. Biopsy on 04/03/19 showed IDC, grade 1-2, HER-2 - (0), ER +95%, PR+ 95%, Ki67 15%. T1 a N0 stage Ia clinical stage  Treatment plan: 1.Breast conserving surgery with sentinel lymph node biopsy7/23/2020: Grade 1 IDC 0.8 cm, 0/3 lymph nodes, ER 95%, PR 95%, KI 6715%, HER-2 negative, T1BN0 stage Ia 2.Adjuvant radiation therapy8/25/2020- 3.Follow-up adjuvant antiestrogen therapy with anastrozole 1 mg daily x5 years -------------------------------------------------------------------------------------------------------------------- Current treatment:Adjuvant antiestrogen therapy with anastrozole 1 mg daily started 07/09/2019 (discontinued due to severe joint aches and pains) switched to tamoxifen 10 mg started 01/11/2020 .  Tamoxifentoxicities: Very occasional hot flashes. I discussed with her about long distance car rides and risk of blood clots with tamoxifen.  Further weight gain: I recommended intermittent fasting. For energy we discussed the role of B12 supplementation.  Bone density 10/14/2019: T score -0.1: Normal Mammogram 03/29/2020: Benign breast density category B Breast exam 08/12/2020: Benign  Return to clinic once a year for follow-up

## 2020-08-17 ENCOUNTER — Ambulatory Visit (INDEPENDENT_AMBULATORY_CARE_PROVIDER_SITE_OTHER): Payer: 59

## 2020-08-17 ENCOUNTER — Encounter: Payer: Self-pay | Admitting: Podiatry

## 2020-08-17 ENCOUNTER — Ambulatory Visit (INDEPENDENT_AMBULATORY_CARE_PROVIDER_SITE_OTHER): Payer: 59 | Admitting: Podiatry

## 2020-08-17 ENCOUNTER — Other Ambulatory Visit: Payer: Self-pay

## 2020-08-17 DIAGNOSIS — M1000 Idiopathic gout, unspecified site: Secondary | ICD-10-CM

## 2020-08-17 DIAGNOSIS — M79671 Pain in right foot: Secondary | ICD-10-CM

## 2020-08-17 DIAGNOSIS — M205X1 Other deformities of toe(s) (acquired), right foot: Secondary | ICD-10-CM

## 2020-08-17 DIAGNOSIS — M79672 Pain in left foot: Secondary | ICD-10-CM

## 2020-08-17 DIAGNOSIS — M779 Enthesopathy, unspecified: Secondary | ICD-10-CM

## 2020-08-17 DIAGNOSIS — M778 Other enthesopathies, not elsewhere classified: Secondary | ICD-10-CM | POA: Diagnosis not present

## 2020-08-17 NOTE — Progress Notes (Signed)
Subjective:   Patient ID: Karen Mills, female   DOB: 64 y.o.   MRN: [id number]   HPI Patient presents stating that she gets joint pain bilateral big toes and states that she was doing well and it started up again over the last few months   ROS      Objective:  Physical Exam  Neurovascular status intact with arthritis of the big toe joints bilateral with inflammation pain right over left first MPJ with possibility of gout that may be a part of the pathology     Assessment:  Inflammatory capsulitis of the first MPJ bilateral with possibility for gout with hallux limitus condition     Plan:  HP x-ray reviewed all conditions discussed.  I did discuss gout gave instructions for gout diet and I am sending for a blood work evaluation to rule out anything systemic.  I then did sterile prep and injected the first MPJ bilateral 3 mg Dexasone Kenalog 5 mg Xylocaine and I advised on reduced activity and possible boot usage as needed.  Reappoint in 1 week and we will discuss results of blood work  X-rays indicate that previous surgery left shows arthritic changes first MPJ with moderate changes around the first MPJ with narrowing of the joint surface first MPJ

## 2020-08-17 NOTE — Patient Instructions (Signed)

## 2020-08-22 LAB — CBC WITH DIFFERENTIAL/PLATELET
Absolute Monocytes: 853 cells/uL (ref 200–950)
Basophils Absolute: 74 cells/uL (ref 0–200)
Basophils Relative: 0.9 %
Eosinophils Absolute: 197 cells/uL (ref 15–500)
Eosinophils Relative: 2.4 %
HCT: 38 % (ref 35.0–45.0)
Hemoglobin: 12.8 g/dL (ref 11.7–15.5)
Lymphs Abs: 1681 cells/uL (ref 850–3900)
MCH: 34.1 pg — ABNORMAL HIGH (ref 27.0–33.0)
MCHC: 33.7 g/dL (ref 32.0–36.0)
MCV: 101.3 fL — ABNORMAL HIGH (ref 80.0–100.0)
MPV: 9.7 fL (ref 7.5–12.5)
Monocytes Relative: 10.4 %
Neutro Abs: 5396 cells/uL (ref 1500–7800)
Neutrophils Relative %: 65.8 %
Platelets: 437 10*3/uL — ABNORMAL HIGH (ref 140–400)
RBC: 3.75 10*6/uL — ABNORMAL LOW (ref 3.80–5.10)
RDW: 11.8 % (ref 11.0–15.0)
Total Lymphocyte: 20.5 %
WBC: 8.2 10*3/uL (ref 3.8–10.8)

## 2020-08-22 LAB — SEDIMENTATION RATE: Sed Rate: 29 mm/h (ref 0–30)

## 2020-08-22 LAB — C-REACTIVE PROTEIN: CRP: 9.2 mg/L — ABNORMAL HIGH (ref <8.0)

## 2020-08-22 LAB — ANTI-NUCLEAR AB-TITER (ANA TITER): ANA Titer 1: 1:160 {titer} — ABNORMAL HIGH

## 2020-08-22 LAB — URIC ACID: Uric Acid, Serum: 8.6 mg/dL — ABNORMAL HIGH (ref 2.5–7.0)

## 2020-08-22 LAB — ANA,IFA RA DIAG PNL W/RFLX TIT/PATN
Anti Nuclear Antibody (ANA): POSITIVE — AB
Cyclic Citrullin Peptide Ab: <16 UNITS
Rheumatoid fact SerPl-aCnc: <14 IU/mL (ref <14)

## 2020-08-23 ENCOUNTER — Other Ambulatory Visit: Payer: Self-pay | Admitting: Podiatry

## 2020-08-23 DIAGNOSIS — M778 Other enthesopathies, not elsewhere classified: Secondary | ICD-10-CM

## 2020-08-23 DIAGNOSIS — M205X1 Other deformities of toe(s) (acquired), right foot: Secondary | ICD-10-CM

## 2020-08-24 ENCOUNTER — Ambulatory Visit (INDEPENDENT_AMBULATORY_CARE_PROVIDER_SITE_OTHER): Payer: 59

## 2020-08-24 ENCOUNTER — Encounter: Payer: Self-pay | Admitting: Podiatry

## 2020-08-24 ENCOUNTER — Ambulatory Visit (INDEPENDENT_AMBULATORY_CARE_PROVIDER_SITE_OTHER): Payer: 59 | Admitting: Podiatry

## 2020-08-24 ENCOUNTER — Other Ambulatory Visit: Payer: Self-pay

## 2020-08-24 ENCOUNTER — Other Ambulatory Visit: Payer: Self-pay | Admitting: Podiatry

## 2020-08-24 DIAGNOSIS — M779 Enthesopathy, unspecified: Secondary | ICD-10-CM | POA: Diagnosis not present

## 2020-08-24 DIAGNOSIS — M1000 Idiopathic gout, unspecified site: Secondary | ICD-10-CM | POA: Diagnosis not present

## 2020-08-24 DIAGNOSIS — M79672 Pain in left foot: Secondary | ICD-10-CM

## 2020-08-24 MED ORDER — METHYLPREDNISOLONE 4 MG PO TBPK: ORAL_TABLET | ORAL | 0 refills | Status: DC

## 2020-08-24 MED ORDER — ALLOPURINOL 100 MG PO TABS: 100.0000 mg | ORAL_TABLET | Freq: Every day | ORAL | 6 refills | Status: DC

## 2020-08-24 NOTE — Progress Notes (Signed)
Subjective:   Patient ID: Karen Mills, female   DOB: 64 y.o.   MRN: [id number]   HPI Patient states she is developed increased swelling and pain of her left foot on the inside of the ankle and on the outside of the ankle.  She wants to review her blood work also   ROS      Objective:  Physical Exam  Neurovascular status intact with inflammation of the medial side of the left ankle and into the sinus tarsi left with redness and fluid buildup and blood work indicated elevated uric acid sed rate and C-reactive protein     Assessment:  Inflammatory capsulitis tendinitis left that is probably related to inflammatory condition with what appears to be significant gout-like symptomatology     Plan:  H&P reviewed her blood work with her and discussed foods and did rewrite her instructions on what type of foods to watch out for.  I did educate her again on gout and we spent a good deal of time discussing her particular situation and I went ahead today and I did inject the sinus tarsi 3 mg Kenalog 5 g Xylocaine in the medial foot in the tendon complex 3 mg Dexasone Kenalog 5 mg Xylocaine

## 2020-08-24 NOTE — Patient Instructions (Signed)

## 2020-09-14 ENCOUNTER — Ambulatory Visit (INDEPENDENT_AMBULATORY_CARE_PROVIDER_SITE_OTHER): Payer: 59 | Admitting: Podiatry

## 2020-09-14 ENCOUNTER — Other Ambulatory Visit: Payer: Self-pay

## 2020-09-14 DIAGNOSIS — M76822 Posterior tibial tendinitis, left leg: Secondary | ICD-10-CM | POA: Diagnosis not present

## 2020-09-14 DIAGNOSIS — M1000 Idiopathic gout, unspecified site: Secondary | ICD-10-CM

## 2020-09-14 MED ORDER — TRIAMCINOLONE ACETONIDE 10 MG/ML IJ SUSP
10.0000 mg | Freq: Once | INTRAMUSCULAR | Status: AC
  Administered 2020-09-14: 10 mg

## 2020-09-14 NOTE — Progress Notes (Signed)
Subjective:   Patient ID: Karen Mills, female   DOB: 64 y.o.   MRN: [id number]   HPI Patient presents stating she is having a lot of pain in her left ankle and states that everything else has responded except for this and she has been taking her allopurinol   ROS      Objective:  Physical Exam  Neurovascular status intact with inflammation pain with swelling redness of the medial side of the left ankle at the posterior tibial insertion into the navicular     Assessment:  Difficult to determine between the possibility that this is still related to acute gout versus inflammatory posterior tibial tendinitis left     Plan:  H&P reviewed condition and today did sterile prep and injected the tendon 3 mg Kenalog 5 mg Xylocaine and went ahead and immobilized with air fracture walker to take all stress off the tendon.  Patient tolerated well will be seen back to recheck

## 2020-10-05 ENCOUNTER — Ambulatory Visit (INDEPENDENT_AMBULATORY_CARE_PROVIDER_SITE_OTHER): Payer: 59 | Admitting: Podiatry

## 2020-10-05 ENCOUNTER — Other Ambulatory Visit: Payer: Self-pay

## 2020-10-05 ENCOUNTER — Ambulatory Visit (INDEPENDENT_AMBULATORY_CARE_PROVIDER_SITE_OTHER): Payer: 59

## 2020-10-05 DIAGNOSIS — M1000 Idiopathic gout, unspecified site: Secondary | ICD-10-CM

## 2020-10-05 DIAGNOSIS — M779 Enthesopathy, unspecified: Secondary | ICD-10-CM

## 2020-10-05 MED ORDER — TRIAMCINOLONE ACETONIDE 10 MG/ML IJ SUSP
10.0000 mg | Freq: Once | INTRAMUSCULAR | Status: AC
  Administered 2020-10-05: 10 mg

## 2020-10-05 MED ORDER — COLCHICINE 0.6 MG PO TABS: 0.6000 mg | ORAL_TABLET | Freq: Every day | ORAL | 1 refills | Status: DC

## 2020-10-05 NOTE — Progress Notes (Signed)
Subjective:   Patient ID: Karen Mills, female   DOB: 64 y.o.   MRN: [id number]   HPI Patient states now she is developed swelling around her big toe joint and has swelling in her foot and ankle with negative Bevelyn Buckles' sign noted.  States it got very tender over the last few days and she did have a couple good weeks and this is a new problem   ROS      Objective:  Physical Exam  Neurovascular status intact with inflammation pain around the first MPJ left with fluid buildup around the joint and redness and moderate swelling of the forefoot midfoot and into the ankle left with negative Homans' sign noted     Assessment:  Inflammatory capsulitis with probable gout of the left first MPJ     Plan:  H&P reviewed gout number been to start her on colchicine at this time.  I went ahead and I did do sterile prep and injected around the first MPJ 3 mg Dexasone Kenalog 5 mg Xylocaine and discussed that if this continues she may ultimately require more colchicine or other treatments and that she does have some nodules on her hand which could be secondary to tophaceous deposit formation  X-rays indicate that there is some lysis around the first metatarsal with pins but no indications of active pathological process

## 2020-10-27 ENCOUNTER — Other Ambulatory Visit: Payer: Self-pay | Admitting: Podiatry

## 2020-10-27 NOTE — Telephone Encounter (Signed)
Please advise 

## 2020-11-19 ENCOUNTER — Other Ambulatory Visit: Payer: Self-pay | Admitting: Podiatry

## 2020-11-21 NOTE — Telephone Encounter (Signed)
Please advise 

## 2020-12-16 ENCOUNTER — Other Ambulatory Visit: Payer: Self-pay | Admitting: Podiatry

## 2020-12-16 NOTE — Telephone Encounter (Signed)
Please advise 

## 2020-12-21 ENCOUNTER — Other Ambulatory Visit: Payer: Self-pay | Admitting: Podiatry

## 2020-12-21 NOTE — Telephone Encounter (Signed)
Please advise 

## 2021-02-01 ENCOUNTER — Other Ambulatory Visit: Payer: Self-pay | Admitting: Hematology and Oncology

## 2021-02-01 DIAGNOSIS — Z853 Personal history of malignant neoplasm of breast: Secondary | ICD-10-CM

## 2021-03-30 ENCOUNTER — Other Ambulatory Visit: Payer: Self-pay | Admitting: Hematology and Oncology

## 2021-03-30 ENCOUNTER — Ambulatory Visit
Admission: RE | Admit: 2021-03-30 | Discharge: 2021-03-30 | Disposition: A | Discharge status: Home / Self Care | Payer: 59 | Source: Ambulatory Visit | Attending: Hematology and Oncology | Admitting: Hematology and Oncology

## 2021-03-30 ENCOUNTER — Other Ambulatory Visit: Payer: Self-pay

## 2021-03-30 DIAGNOSIS — Z853 Personal history of malignant neoplasm of breast: Secondary | ICD-10-CM

## 2021-03-30 DIAGNOSIS — R921 Mammographic calcification found on diagnostic imaging of breast: Secondary | ICD-10-CM

## 2021-03-31 ENCOUNTER — Ambulatory Visit
Admission: RE | Admit: 2021-03-31 | Discharge: 2021-03-31 | Disposition: A | Discharge status: Home / Self Care | Payer: 59 | Source: Ambulatory Visit | Attending: Hematology and Oncology | Admitting: Hematology and Oncology

## 2021-03-31 DIAGNOSIS — Z853 Personal history of malignant neoplasm of breast: Secondary | ICD-10-CM

## 2021-03-31 DIAGNOSIS — R921 Mammographic calcification found on diagnostic imaging of breast: Secondary | ICD-10-CM

## 2021-04-06 ENCOUNTER — Other Ambulatory Visit: Payer: Self-pay | Admitting: Hematology and Oncology

## 2021-04-06 DIAGNOSIS — Z09 Encounter for follow-up examination after completed treatment for conditions other than malignant neoplasm: Secondary | ICD-10-CM

## 2021-05-03 ENCOUNTER — Telehealth: Payer: Self-pay

## 2021-05-03 NOTE — Telephone Encounter (Signed)
RN spoke with patient regarding concerns with hair thinning.   Patient with history of breast cancer; currently on Tamoxifen.  Patient reports + Covid in October, and history of thyroid disorder.    RN educated on side effects of Tamoxifen, including hair thinning.  Pt has apt with PCP on 7/29 to have thyroid levels checked.  RN encouraged use of Biotin and Vit B 6.    Patient will report back to clinic to notify if TSH levels are WNL.  If levels are normal, RN informed we will review with MD for any additional recommendations.

## 2021-05-09 ENCOUNTER — Telehealth: Payer: Self-pay

## 2021-05-09 NOTE — Telephone Encounter (Signed)
Pt called to follow up with concerns with hair thinning.   Patient currently on Tamoxifen; recently had visit with PCP to have thyroid levels check due to h/o thyroid disorder.  Patient reports TSH was WNL, however her T3 and T4 were high, PCP change dosage of medication.   RN educated patient that several factors could be contributing to hair loss including thyroid, post covid, and Tamoxifen therapy.  Pt voiced great concern she feels Tamoxifen could be causing hair thinning.    MD recommendations for patient to hold Tamoxifen X 2 weeks, add Vitamin B 6 supplement.  Pt will call back to clinic in 2 weeks to report if hair thinning subsides.  At that time we will review re-starting Tamoxifen vs changing medication.  Pt verbalized understanding and agreement.  No further needs at this time.

## 2021-06-28 DIAGNOSIS — L650 Telogen effluvium: Secondary | ICD-10-CM | POA: Diagnosis not present

## 2021-07-03 DIAGNOSIS — L650 Telogen effluvium: Secondary | ICD-10-CM | POA: Diagnosis not present

## 2021-07-03 DIAGNOSIS — Z79899 Other long term (current) drug therapy: Secondary | ICD-10-CM | POA: Diagnosis not present

## 2021-07-12 DIAGNOSIS — Z23 Encounter for immunization: Secondary | ICD-10-CM | POA: Diagnosis not present

## 2021-08-12 NOTE — Progress Notes (Signed)
Patient Care Team: Lawerance Cruel, MD as PCP - General (Family Medicine) Nicholas Lose, MD as Consulting Physician (Hematology and Oncology) Kyung Rudd, MD as Consulting Physician (Radiation Oncology) Jovita Kussmaul, MD as Consulting Physician (General Surgery)  DIAGNOSIS:    ICD-10-CM   1. Malignant neoplasm of lower-outer quadrant of left breast of female, estrogen receptor positive ([place])  C50.512    Z17.0       SUMMARY OF ONCOLOGIC HISTORY: Oncology History  Malignant neoplasm of lower-outer quadrant of left breast of female, estrogen receptor positive ([place])  04/03/2019 Cancer Staging   Staging form: Breast, AJCC 8th Edition - Clinical stage from 04/03/2019: Stage IA (cT1a, cN0, cM0, G2, ER+, PR+, HER2-) - Signed by Gardenia Phlegm, NP on 04/15/2019    04/15/2019 Initial Diagnosis   Screening mammogram detected 0.4cm mass in the left breast at the 6 o'clock position with no left axillary adenopathy. Biopsy on 04/03/19 showed IDC, grade 1-2, HER-2 - (0), ER +95%, PR+ 95%, Ki67 15%.    04/30/2019 Surgery   Left lumpectomy Marlou Starks): IDC, grade 1, 0.8cm, intermediate grade DCIS, lymphovascular invasion present, clear margins. Three left axillary lymph nodes negative for carcinoma.    06/02/2019 -  Radiation Therapy   Adjuvant radiation   08/2019 -  Anti-estrogen oral therapy   Anastrozole daily stopped 10/2019 due to severe joint pain; switched to tamoxifen $RemoveBefo'10mg'[id number]$  on 01/11/20     CHIEF COMPLIANT: Follow-up of right breast cancer on tamoxifen  INTERVAL HISTORY: Karen Mills is a 65 y.o. with above-mentioned history of right breast cancer who underwent a lumpectomy, radiation, and is currently on antiestrogen therapy with tamoxifen. Mammogram on 03/30/2021 showed two new groups of calcifications, 2 mm apart, in the left breast. Biopsy on 03/31/2021 showed no evidence of malignancy. She presents to the clinic today for follow-up.  Her major complaint today is a hair  thinning/hair loss.  She started tamoxifen but did not make an improvement therefore she resumed it.  She is working with dermatology and her primary care physician regarding this.  ALLERGIES:  is allergic to accupril [quinapril hcl], amlodipine besylate, atacand [candesartan], cozaar [losartan potassium], maxzide [hydrochlorothiazide w-triamterene], tiazac [diltiazem hcl er beads], and zestril [lisinopril].  MEDICATIONS:  Current Outpatient Medications  Medication Sig Dispense Refill   albuterol (VENTOLIN HFA) 108 (90 Base) MCG/ACT inhaler Inhale into the lungs.     allopurinol (ZYLOPRIM) 100 MG tablet Take 1 tablet (100 mg total) by mouth daily. 30 tablet 6   anastrozole (ARIMIDEX) 1 MG tablet Take 1 mg by mouth daily.     Biotin (BIOTIN 5000) 5 MG CAPS Take by mouth daily. 2 capsules once a day.     cholecalciferol (VITAMIN D3) 25 MCG (1000 UT) tablet Take 1,000 Units by mouth daily.     colchicine 0.6 MG tablet TAKE 1 TABLET BY MOUTH EVERY DAY 90 tablet 1   furosemide (LASIX) 20 MG tablet Take 20 mg by mouth daily.     hydrALAZINE (APRESOLINE) 25 MG tablet Take 25 mg by mouth once.   3   hydrochlorothiazide (MICROZIDE) 12.5 MG capsule Take by mouth daily.     levothyroxine (SYNTHROID) 137 MCG tablet Take by mouth.     levothyroxine (SYNTHROID, LEVOTHROID) 150 MCG tablet Take 1 tablet by mouth daily.  4   methylPREDNISolone (MEDROL DOSEPAK) 4 MG TBPK tablet follow package directions 21 tablet 0   Omega-3 Fatty Acids (FISH OIL PO) Take 1 tablet by mouth daily.  OREGANO PO Take 1 tablet by mouth daily.     tamoxifen (NOLVADEX) 10 MG tablet Take 1 tablet (10 mg total) by mouth daily. 90 tablet 3   Turmeric (QC TUMERIC COMPLEX PO) Take 1,000 mg by mouth daily.     vitamin E 400 UNIT capsule Take 400 Units by mouth daily.      No current facility-administered medications for this visit.    PHYSICAL EXAMINATION: ECOG PERFORMANCE STATUS: 1 - Symptomatic but completely  ambulatory  Vitals:   08/14/21 0819  BP: (!) 154/66  Pulse: 77  Resp: 18  Temp: (!) 97.5 F (36.4 C)  SpO2: 99%   Filed Weights   08/14/21 0819  Weight: 215 lb 1.6 oz (97.6 kg)    BREAST: No palpable masses or nodules in either right or left breasts. No palpable axillary supraclavicular or infraclavicular adenopathy no breast tenderness or nipple discharge. (exam performed in the presence of a chaperone)  LABORATORY DATA:  I have reviewed the data as listed CMP Latest Ref Rng & Units 04/29/2019 06/24/2008  Glucose 70 - 99 mg/dL 99 99  BUN 8 - 23 mg/dL 31(H) 10  Creatinine 0.44 - 1.00 mg/dL 1.66(H) 0.72  Sodium 135 - 145 mmol/L 136 135  Potassium 3.5 - 5.1 mmol/L 4.5 3.7  Chloride 98 - 111 mmol/L 105 99  CO2 22 - 32 mmol/L 21(L) 28  Calcium 8.9 - 10.3 mg/dL 9.6 9.2    Lab Results  Component Value Date   WBC 8.2 08/17/2020   HGB 12.8 08/17/2020   HCT 38.0 08/17/2020   MCV 101.3 (H) 08/17/2020   PLT 437 (H) 08/17/2020   NEUTROABS 5,396 08/17/2020    ASSESSMENT & PLAN:  Malignant neoplasm of lower-outer quadrant of left breast of female, estrogen receptor positive ([place]) 04/15/2019:Screening mammogram detected 0.4cm mass in the left breast at the 6 o'clock position with no left axillary adenopathy. Biopsy on 04/03/19 showed IDC, grade 1-2, HER-2 - (0), ER +95%, PR+ 95%, Ki67 15%.  T1 a N0 stage Ia clinical stage   Treatment plan: 1.  Breast conserving surgery with sentinel lymph node biopsy 04/30/2019: Grade 1 IDC 0.8 cm, 0/3 lymph nodes, ER 95%, PR 95%, KI 6715%, HER-2 negative, T1BN0 stage Ia 2.  Adjuvant radiation therapy 06/02/2019- 3.  Follow-up adjuvant antiestrogen therapy with anastrozole 1 mg daily x5 years -------------------------------------------------------------------------------------------------------------------- Current treatment: Adjuvant antiestrogen therapy with anastrozole 1 mg daily started 07/09/2019 (discontinued due to severe joint aches and pains)  switched to tamoxifen 10 mg started 01/11/2020 .   Tamoxifen toxicities:  Very occasional hot flashes. I discussed with her about long distance car rides and risk of blood clots with tamoxifen.   Hair loss: Unclear etiology   Breast Cancer Surveillance: 1. Mammogram 03/30/2021: Left breast calcifications: Biopsy: Fat necrosis breast density category B, 30-month follow-up recommended 2. Breast exam 08/14/2021: Benign Bone density 10/14/2019: T score -0.1: Normal    Return to clinic once a year for follow-up    No orders of the defined types were placed in this encounter.  The patient has a good understanding of the overall plan. she agrees with it. she will call with any problems that may develop before the next visit here.  Total time spent: 20 mins including face to face time and time spent for planning, charting and coordination of care  Rulon Eisenmenger, MD, MPH 08/14/2021  I, Thana Ates, am acting as scribe for Dr. Nicholas Lose.  I have reviewed the above documentation for accuracy  and completeness, and I agree with the above.

## 2021-08-14 ENCOUNTER — Inpatient Hospital Stay: Payer: Medicare Other | Attending: Hematology and Oncology | Admitting: Hematology and Oncology

## 2021-08-14 ENCOUNTER — Other Ambulatory Visit: Payer: Self-pay

## 2021-08-14 DIAGNOSIS — C50512 Malignant neoplasm of lower-outer quadrant of left female breast: Secondary | ICD-10-CM | POA: Insufficient documentation

## 2021-08-14 DIAGNOSIS — Z170 Estrogen receptor positive status [ER+]: Secondary | ICD-10-CM | POA: Diagnosis not present

## 2021-08-14 DIAGNOSIS — L659 Nonscarring hair loss, unspecified: Secondary | ICD-10-CM | POA: Diagnosis not present

## 2021-08-14 MED ORDER — TAMOXIFEN CITRATE 10 MG PO TABS: 10.0000 mg | ORAL_TABLET | Freq: Every day | ORAL | 3 refills | Status: DC

## 2021-08-14 NOTE — Assessment & Plan Note (Signed)
04/15/2019:Screening mammogram detected 0.4cm mass in the left breast at the 6 o'clock position with no left axillary adenopathy. Biopsy on 04/03/19 showed IDC, grade 1-2, HER-2 - (0), ER +95%, PR+ 95%, Ki67 15%. T1 a N0 stage Ia clinical stage  Treatment plan: 1.Breast conserving surgery with sentinel lymph node biopsy7/23/2020: Grade 1 IDC 0.8 cm, 0/3 lymph nodes, ER 95%, PR 95%, KI 6715%, HER-2 negative, T1BN0 stage Ia 2.Adjuvant radiation therapy8/25/2020- 3.Follow-up adjuvant antiestrogen therapy with anastrozole 1 mg daily x5 years -------------------------------------------------------------------------------------------------------------------- Current treatment:Adjuvant antiestrogen therapy with anastrozole 1 mg dailystarted10/10/2018(discontinued due to severe joint aches and pains) switched to tamoxifen 10 mg started 01/11/2020 .  Tamoxifentoxicities: Very occasional hot flashes. I discussed with her about long distance car rides and risk of blood clots with tamoxifen.  Further weight gain: I recommended intermittent fasting. For energy we discussed the role of B12 supplementation.  Breast Cancer Surveillance: 1. Mammogram 03/30/2021: Benign breast density category B 2. Breast exam 08/14/2021: Benign Bone density 10/14/2019: T score -0.1: Normal   Return to clinic once a year for follow-up

## 2021-09-03 ENCOUNTER — Other Ambulatory Visit: Payer: Self-pay | Admitting: Podiatry

## 2021-09-04 NOTE — Telephone Encounter (Signed)
Please advise 

## 2021-09-12 DIAGNOSIS — I10 Essential (primary) hypertension: Secondary | ICD-10-CM | POA: Diagnosis not present

## 2021-09-12 DIAGNOSIS — D531 Other megaloblastic anemias, not elsewhere classified: Secondary | ICD-10-CM | POA: Diagnosis not present

## 2021-09-12 DIAGNOSIS — E039 Hypothyroidism, unspecified: Secondary | ICD-10-CM | POA: Diagnosis not present

## 2021-09-12 DIAGNOSIS — M109 Gout, unspecified: Secondary | ICD-10-CM | POA: Diagnosis not present

## 2021-09-12 DIAGNOSIS — E782 Mixed hyperlipidemia: Secondary | ICD-10-CM | POA: Diagnosis not present

## 2021-09-13 ENCOUNTER — Encounter: Payer: Self-pay | Admitting: Genetic Counselor

## 2021-09-13 ENCOUNTER — Other Ambulatory Visit: Payer: Self-pay | Admitting: Genetic Counselor

## 2021-09-13 ENCOUNTER — Inpatient Hospital Stay: Payer: Medicare Other | Attending: Hematology and Oncology | Admitting: Genetic Counselor

## 2021-09-13 ENCOUNTER — Inpatient Hospital Stay: Payer: Medicare Other

## 2021-09-13 ENCOUNTER — Other Ambulatory Visit: Payer: Self-pay

## 2021-09-13 DIAGNOSIS — Z800 Family history of malignant neoplasm of digestive organs: Secondary | ICD-10-CM | POA: Insufficient documentation

## 2021-09-13 DIAGNOSIS — C50512 Malignant neoplasm of lower-outer quadrant of left female breast: Secondary | ICD-10-CM

## 2021-09-13 DIAGNOSIS — Z170 Estrogen receptor positive status [ER+]: Secondary | ICD-10-CM

## 2021-09-13 LAB — GENETIC SCREENING ORDER

## 2021-09-13 NOTE — Progress Notes (Signed)
REFERRING PROVIDER: Nicholas Lose, MD [address] [place],  [place] [address]  PRIMARY PROVIDER:  Lawerance Cruel, MD  PRIMARY REASON FOR VISIT:  1. Malignant neoplasm of lower-outer quadrant of left breast of female, estrogen receptor positive ([place])   2. Family history of pancreatic cancer    HISTORY OF PRESENT ILLNESS:   Karen Mills, a 65 y.o. female, was seen for a [facility] cancer genetics consultation at the request of Dr. Lindi Adie due to a personal and family history of cancer.  Karen Mills presents to clinic today to discuss the possibility of a hereditary predisposition to cancer, to discuss genetic testing, and to further clarify her future cancer risks, as well as potential cancer risks for family members.   In 2020, at the age of 25, Karen Mills was diagnosed with invasive ductal carcinoma of the left breast. The treatment plan included breast conserving surgery, adjuvant radiation, and adjuvant anti-estrogens.    CANCER HISTORY:  Oncology History  Malignant neoplasm of lower-outer quadrant of left breast of female, estrogen receptor positive ([place])  04/03/2019 Cancer Staging   Staging form: Breast, AJCC 8th Edition - Clinical stage from 04/03/2019: Stage IA (cT1a, cN0, cM0, G2, ER+, PR+, HER2-) - Signed by Gardenia Phlegm, NP on 04/15/2019    04/15/2019 Initial Diagnosis   Screening mammogram detected 0.4cm mass in the left breast at the 6 o'clock position with no left axillary adenopathy. Biopsy on 04/03/19 showed IDC, grade 1-2, HER-2 - (0), ER +95%, PR+ 95%, Ki67 15%.    04/30/2019 Surgery   Left lumpectomy Marlou Starks): IDC, grade 1, 0.8cm, intermediate grade DCIS, lymphovascular invasion present, clear margins. Three left axillary lymph nodes negative for carcinoma.    06/02/2019 -  Radiation Therapy   Adjuvant radiation   08/2019 -  Anti-estrogen oral therapy   Anastrozole daily stopped 10/2019 due to severe joint pain; switched to tamoxifen 27m on  01/11/20     RISK FACTORS:  Menarche was at age [age]or 89  First live birth at age 65  OCP use for approximately 5 years.  Ovaries intact: yes.  Hysterectomy: no.  Menopausal status: postmenopausal.  HRT use: 0 years. Colonoscopy: yes;  most recent between 558and 7 years ago . Mammogram within the last year: yes. Up to date with pelvic exams: no. Any excessive radiation exposure in the past: no  Past Medical History:  Diagnosis Date   Breast cancer ([facility] 2020   Left Breast Cancer   Hyperlipidemia    Hypertension    Hypothyroidism    Personal history of radiation therapy 2020   Left Breast Cancer   PONV (postoperative nausea and vomiting)    SCC (squamous cell carcinoma) Keratoacanthoma 10/10/2016   Right Shin (Cx3,5FU)   Squamous cell carcinoma in situ (SCCIS) 11/15/2016   Right Lower Shin (tx p bx)   Thyroid disease    Varicose veins     Past Surgical History:  Procedure Laterality Date   ABLATION ON ENDOMETRIOSIS     BREAST LUMPECTOMY Left 04/30/2019   BREAST LUMPECTOMY WITH RADIOACTIVE SEED AND SENTINEL LYMPH NODE BIOPSY Left 04/30/2019   Procedure: LEFT BREAST LUMPECTOMY WITH RADIOACTIVE SEED AND SENTINEL LYMPH NODE BIOPSY;  Surgeon: TJovita Kussmaul MD;  Location: [facility]  Service: General;  Laterality: Left;   FOOT SURGERY     TONSILLECTOMY     vein removal      Social History   Socioeconomic History   Marital status: Married  Spouse name: Not on file   Number of children: Not on file   Years of education: Not on file   Highest education level: Not on file  Occupational History   Not on file  Tobacco Use   Smoking status: Never   Smokeless tobacco: Never  Vaping Use   Vaping Use: Never used  Substance and Sexual Activity   Alcohol use: Yes    Comment: 2-3x per week   Drug use: No   Sexual activity: Not on file  Other Topics Concern   Not on file  Social History Narrative   Not on file   Social Determinants of Health    Financial Resource Strain: Not on file  Food Insecurity: Not on file  Transportation Needs: Not on file  Physical Activity: Not on file  Stress: Not on file  Social Connections: Not on file     FAMILY HISTORY:  We obtained a detailed, 4-generation family history.  Significant diagnoses are listed below: Family History  Problem Relation Age of Onset   Pancreatic cancer Half-Brother        d. early 71s     Karen Mills is unaware of previous family history of genetic testing for hereditary cancer risks. There is no reported Ashkenazi Jewish ancestry. There is no known consanguinity.  GENETIC COUNSELING ASSESSMENT: Karen Mills is a 65 y.o. female with a personal and family history of cancer which is somewhat suggestive of a hereditary cancer syndrome and predisposition to cancer given the presence of related cancers in the family. We, therefore, discussed and recommended the following at today's visit.   DISCUSSION: We discussed that 5 - 10% of cancer is hereditary, with most cases of hereditary breast cancer associated with mutations in BRCA1/2.  There are other genes that can be associated with hereditary breast cancer syndromes.  We discussed that testing is beneficial for several reasons including knowing how to follow individuals for their cancer risks and understanding if other family members could be at risk for cancer and allowing them to undergo genetic testing.   We reviewed the characteristics, features and inheritance patterns of hereditary cancer syndromes. We also discussed genetic testing, including the appropriate family members to test, the process of testing, insurance coverage and turn-around-time for results. We discussed the implications of a negative, positive, carrier and/or variant of uncertain significant result. We recommended Karen Mills pursue genetic testing for a panel that includes genes associated with breast and pancreatic cancer.   Karen Mills  was offered a  common hereditary cancer panel (47 genes) and an expanded pan-cancer panel (77 genes). Karen Mills was informed of the benefits and limitations of each panel, including that expanded pan-cancer panels contain genes that do not have clear management guidelines at this point in time.  We also discussed that as the number of genes included on a panel increases, the chances of variants of uncertain significance increases.  After considering the benefits and limitations of each gene panel, Karen Mills  elected to have an expanded Radio broadcast assistant through [place].  The CancerNext-Expanded gene panel offered by [facility] and includes sequencing, rearrangement, and RNA analysis for the following 77 genes: AIP, ALK, APC, ATM, AXIN2, BAP1, BARD1, BLM, BMPR1A, BRCA1, BRCA2, BRIP1, CDC73, CDH1, CDK4, CDKN1B, CDKN2A, CHEK2, CTNNA1, DICER1, FANCC, FH, FLCN, GALNT12, KIF1B, LZTR1, MAX, MEN1, MET, MLH1, MSH2, MSH3, MSH6, MUTYH, NBN, NF1, NF2, NTHL1, PALB2, PHOX2B, PMS2, POT1, PRKAR1A, PTCH1, PTEN, RAD51C, RAD51D, RB1, RECQL, RET, SDHA, SDHAF2, SDHB, SDHC, SDHD, SMAD4, SMARCA4,  SMARCB1, SMARCE1, STK11, SUFU, TMEM127, TP53, TSC1, TSC2, VHL and XRCC2 (sequencing and deletion/duplication); EGFR, EGLN1, HOXB13, KIT, MITF, PDGFRA, POLD1, and POLE (sequencing only); EPCAM and GREM1 (deletion/duplication only).   Based on Karen Mills's personal and family history of cancer, she meets medical criteria for genetic testing. Despite that she meets criteria, she may still have an out of pocket cost. We discussed that if her out of pocket cost for testing is over $100, the laboratory should contact her to discuss self-pay options and/or patient pay assistance programs.   PLAN: After considering the risks, benefits, and limitations, Karen Mills provided informed consent to pursue genetic testing and the blood sample was sent to [facility] for analysis of the CancerNext-Expanded +RNAinsight Panel. Results should be available  within approximately 3 weeks' time, at which point they will be disclosed by telephone to Karen Mills, as will any additional recommendations warranted by these results. Karen Mills will receive a summary of her genetic counseling visit and a copy of her results once available. This information will also be available in Epic.   Lastly, we encouraged Karen Mills to remain in contact with cancer genetics annually so that we can continuously update the family history and inform her of any changes in cancer genetics and testing that may be of benefit for this family.   Karen Mills questions were answered to her satisfaction today. Our contact information was provided should additional questions or concerns arise. Thank you for the referral and allowing [place] to share in the care of your patient.   Karen Mills M. Joette Catching, [place], [facility] Genetic Counselor [email or web address] (P) [phone]  The patient was seen for a total of 40 minutes in face-to-face genetic counseling.  The patient was seen alone.  Drs. Magrinat, Lindi Adie and/or Burr Medico were available to discuss this case as needed.    _______________________________________________________________________ For Office Staff:  Number of people involved in session: 1 Was an Intern/ student involved with case: no

## 2021-09-19 DIAGNOSIS — Z170 Estrogen receptor positive status [ER+]: Secondary | ICD-10-CM | POA: Diagnosis not present

## 2021-09-19 DIAGNOSIS — C50512 Malignant neoplasm of lower-outer quadrant of left female breast: Secondary | ICD-10-CM | POA: Diagnosis not present

## 2021-09-22 DIAGNOSIS — I10 Essential (primary) hypertension: Secondary | ICD-10-CM | POA: Diagnosis not present

## 2021-09-22 DIAGNOSIS — E782 Mixed hyperlipidemia: Secondary | ICD-10-CM | POA: Diagnosis not present

## 2021-09-22 DIAGNOSIS — Z0000 Encounter for general adult medical examination without abnormal findings: Secondary | ICD-10-CM | POA: Diagnosis not present

## 2021-09-22 DIAGNOSIS — E039 Hypothyroidism, unspecified: Secondary | ICD-10-CM | POA: Diagnosis not present

## 2021-09-26 ENCOUNTER — Other Ambulatory Visit: Payer: Self-pay | Admitting: Hematology and Oncology

## 2021-09-26 DIAGNOSIS — R921 Mammographic calcification found on diagnostic imaging of breast: Secondary | ICD-10-CM

## 2021-10-03 ENCOUNTER — Ambulatory Visit
Admission: RE | Admit: 2021-10-03 | Discharge: 2021-10-03 | Disposition: A | Discharge status: Home / Self Care | Payer: Medicare Other | Source: Ambulatory Visit | Attending: Hematology and Oncology | Admitting: Hematology and Oncology

## 2021-10-03 DIAGNOSIS — Z853 Personal history of malignant neoplasm of breast: Secondary | ICD-10-CM | POA: Diagnosis not present

## 2021-10-03 DIAGNOSIS — R921 Mammographic calcification found on diagnostic imaging of breast: Secondary | ICD-10-CM

## 2021-10-03 DIAGNOSIS — R922 Inconclusive mammogram: Secondary | ICD-10-CM | POA: Diagnosis not present

## 2021-10-04 ENCOUNTER — Other Ambulatory Visit: Payer: Self-pay | Admitting: Hematology and Oncology

## 2021-10-04 DIAGNOSIS — R921 Mammographic calcification found on diagnostic imaging of breast: Secondary | ICD-10-CM

## 2021-10-08 NOTE — Progress Notes (Signed)
Cardiology Office Note:    Date:  10/12/2021   ID:  Karen Mills, DOB 10/25/1955, MRN [id number]  PCP:  Lawerance Cruel, MD   [place] Providers Cardiologist:  Lenna Sciara, MD Referring MD: Lawerance Cruel, MD   Chief Complaint/Reason for Referral:  Murmur and hyperlipidemia}   ASSESSMENT:    Murmur  Hyperlipidemia, unspecified hyperlipidemia type  Hypertension, unspecified type    PLAN:    In order of problems listed above:  1.  Obtain echocardiogram to evaluate further.  We will keep follow-up open-ended depending on these results.  2.  We will obtain a coronary CTA to evaluate further.  If the patient has mild obstructive coronary artery disease They will require a statin (with goal LDL < 70) and aspirin.     3.  Blood pressure is elevated today.  She just took her medications prior to coming to the appointment today.  Monitor for now.            Dispo:  No follow-ups on file.     Medication Adjustments/Labs and Tests Ordered: Current medicines are reviewed at length with the patient today.  Concerns regarding medicines are outlined above.   Tests Ordered: No orders of the defined types were placed in this encounter.   Medication Changes: No orders of the defined types were placed in this encounter.   History of Present Illness:    The patient is a 66 y.o. female with the indicated medical history here for recommendations regarding an incidentally noted murmur and an abnormal lipid panel.  Patient was seen by her primary care provider recently was doing relatively well.  A murmur was noted.  She is well.  She denies any chest pain, palpitations, paroxysmal nocturnal dyspnea, orthopnea, or exertional dyspnea.  She has required no hospitalizations or emergency room visits.  She is primarily concerned about whether her cardiovascular health is good and whether she needs her hyperlipidemia treated.    Previous Medical History: Past Medical  History:  Diagnosis Date   Breast cancer ([place]) 2020   Left Breast Cancer   CKD (chronic kidney disease)    Gout    Hyperlipidemia    Hypertension    Hypothyroidism    Megaloblastic anemia    Obesity    Personal history of radiation therapy 2020   Left Breast Cancer   PONV (postoperative nausea and vomiting)    SCC (squamous cell carcinoma) Keratoacanthoma 10/10/2016   Right Shin (Cx3,5FU)   Squamous cell carcinoma in situ (SCCIS) 11/15/2016   Right Lower Shin (tx p bx)   Thyroid disease    Varicose veins      Current Medications: Current Meds  Medication Sig   allopurinol (ZYLOPRIM) 100 MG tablet Take 1 tablet (100 mg total) by mouth daily.   B Complex-C (SUPER B COMPLEX PO) Take by mouth.   Biotin 5 MG CAPS Take by mouth daily. 2 capsules once a day.   carvedilol (COREG) 3.125 MG tablet Take 3.125 mg by mouth 2 (two) times daily with a meal.   cholecalciferol (VITAMIN D3) 25 MCG (1000 UT) tablet Take 1,000 Units by mouth daily.   colchicine 0.6 MG tablet TAKE 1 TABLET BY MOUTH EVERY DAY   furosemide (LASIX) 20 MG tablet Take 20 mg by mouth daily.   hydrALAZINE (APRESOLINE) 25 MG tablet Take 25 mg by mouth 3 (three) times daily.   levothyroxine (SYNTHROID) 100 MCG tablet Take 100 mcg by mouth daily.   liothyronine (CYTOMEL) 5  MCG tablet Take 5 mcg by mouth daily.   Omega-3 Fatty Acids (FISH OIL PO) Take 1 tablet by mouth daily.   pyridOXINE (VITAMIN B-6) 100 MG tablet Take 100 mg by mouth daily.   tamoxifen (NOLVADEX) 10 MG tablet Take 1 tablet (10 mg total) by mouth daily.   vitamin E 400 UNIT capsule Take 670 Units by mouth daily.     Allergies:    Accupril [quinapril hcl], Amlodipine besylate, Atacand [candesartan], Cozaar [losartan potassium], Maxzide [hydrochlorothiazide w-triamterene], Tiazac [diltiazem hcl er beads], and Zestril [lisinopril]   Social History:   Social History   Tobacco Use   Smoking status: Never   Smokeless tobacco: Never  Vaping Use    Vaping Use: Never used  Substance Use Topics   Alcohol use: Yes    Comment: 2-3x per week   Drug use: No     Family Hx: Family History  Problem Relation Age of Onset   Heart disease Mother    Heart disease Father    Heart disease Brother    Pancreatic cancer Half-Brother        d. early 72s     Review of Systems:   Please see the history of present illness.    All other systems reviewed and are negative.  EKGs/Labs/Other Test Reviewed:    EKG:  EKG today: Sinus rhythm; prior EKG: Sinus bradycardia nonspecific ST and T wave changes  Prior CV studies: None available  Imaging studies that I have independently reviewed today: No relevant studies available  Recent Labs: No results found for requested labs within last 8760 hours.   Recent Lipid Panel Outside labs demonstrate from December 2022 demonstrated total cholesterol 206, triglycerides 246, LDL 133, and HDL 29.  Risk Assessment/Calculations:          Physical Exam:    VS:  BP (!) 152/72    Pulse 76    Ht 5\' 8"  (1.727 m)    Wt 210 lb (95.3 kg)    SpO2 97%    BMI 31.93 kg/m    Wt Readings from Last 3 Encounters:  10/12/21 210 lb (95.3 kg)  08/14/21 215 lb 1.6 oz (97.6 kg)  08/12/20 233 lb 9.6 oz (106 kg)    GENERAL:  No apparent distress, AOx3 HEENT:  No carotid bruits, +2 carotid impulses, no scleral icterus CAR: RRR no murmurs, gallops, rubs, or thrills RES:  Clear to auscultation bilaterally ABD:  Soft, nontender, nondistended, positive bowel sounds x 4 VASC:  +2 radial pulses, +2 carotid pulses, palpable pedal pulses NEURO:  CN 2-12 grossly intact; motor and sensory grossly intact PSYCH:  No active depression or anxiety EXT:  No edema, ecchymosis, or cyanosis  Signed, Early Osmond, MD  10/12/2021 10:05 AM    [address], [place], [place]  [address] Phone: [phone]; Fax: [phone]   Note:  This document was prepared using Dragon voice recognition  software and may include unintentional dictation errors.

## 2021-10-10 ENCOUNTER — Telehealth: Payer: Self-pay | Admitting: Genetic Counselor

## 2021-10-10 ENCOUNTER — Encounter: Payer: Self-pay | Admitting: Genetic Counselor

## 2021-10-10 DIAGNOSIS — Z1379 Encounter for other screening for genetic and chromosomal anomalies: Secondary | ICD-10-CM | POA: Insufficient documentation

## 2021-10-10 NOTE — Telephone Encounter (Signed)
Contacted patient in attempt to disclose results of genetic testing.  LVM with contact information requesting a call back.  

## 2021-10-12 ENCOUNTER — Other Ambulatory Visit: Payer: Self-pay

## 2021-10-12 ENCOUNTER — Ambulatory Visit: Payer: Medicare Other | Admitting: Internal Medicine

## 2021-10-12 ENCOUNTER — Encounter: Payer: Self-pay | Admitting: Internal Medicine

## 2021-10-12 ENCOUNTER — Ambulatory Visit: Payer: Self-pay | Admitting: Genetic Counselor

## 2021-10-12 VITALS — BP 152/72 | HR 76 | Ht 68.0 in | Wt 210.0 lb

## 2021-10-12 DIAGNOSIS — C50512 Malignant neoplasm of lower-outer quadrant of left female breast: Secondary | ICD-10-CM

## 2021-10-12 DIAGNOSIS — I10 Essential (primary) hypertension: Secondary | ICD-10-CM

## 2021-10-12 DIAGNOSIS — R011 Cardiac murmur, unspecified: Secondary | ICD-10-CM | POA: Diagnosis not present

## 2021-10-12 DIAGNOSIS — Z1379 Encounter for other screening for genetic and chromosomal anomalies: Secondary | ICD-10-CM

## 2021-10-12 DIAGNOSIS — E785 Hyperlipidemia, unspecified: Secondary | ICD-10-CM | POA: Diagnosis not present

## 2021-10-12 DIAGNOSIS — Z800 Family history of malignant neoplasm of digestive organs: Secondary | ICD-10-CM

## 2021-10-12 NOTE — Telephone Encounter (Signed)
Revealed negative genetic testing.  Discussed that we do not know why she has a history of breast cancer or why there is cancer in the family. It could be sporadic/familial, due to a different gene that we are not testing, or maybe our current technology may not be able to pick something up.  It will be important for her to keep in contact with genetics to keep up with whether additional testing may be needed.  Recommended genetic testing for FDRs of brother w/ pancreatic cancer.

## 2021-10-12 NOTE — Progress Notes (Signed)
HPI:   Karen Mills was previously seen in the [place] clinic due to a personal and family history of cancer and concerns regarding a hereditary predisposition to cancer. Please refer to our prior cancer genetics clinic note for more information regarding our discussion, assessment and recommendations, at the time. Karen Mills's recent genetic test results were disclosed to her, as were recommendations warranted by these results. These results and recommendations are discussed in more detail below.  CANCER HISTORY:  Oncology History  Malignant neoplasm of lower-outer quadrant of left breast of female, estrogen receptor positive ([place])  04/03/2019 Cancer Staging   Staging form: Breast, AJCC 8th Edition - Clinical stage from 04/03/2019: Stage IA (cT1a, cN0, cM0, G2, ER+, PR+, HER2-) - Signed by Gardenia Phlegm, NP on 04/15/2019    04/15/2019 Initial Diagnosis   Screening mammogram detected 0.4cm mass in the left breast at the 6 o'clock position with no left axillary adenopathy. Biopsy on 04/03/19 showed IDC, grade 1-2, HER-2 - (0), ER +95%, PR+ 95%, Ki67 15%.    04/30/2019 Surgery   Left lumpectomy Marlou Starks): IDC, grade 1, 0.8cm, intermediate grade DCIS, lymphovascular invasion present, clear margins. Three left axillary lymph nodes negative for carcinoma.    06/02/2019 -  Radiation Therapy   Adjuvant radiation   08/2019 -  Anti-estrogen oral therapy   Anastrozole daily stopped 10/2019 due to severe joint pain; switched to tamoxifen 15m on 01/11/20   10/06/2021 Genetic Testing   Negative hereditary cancer genetic testing: no pathogenic variants detected in [facility] CancerNext-Expanded +RNAinsight Panel.  Variants of uncertain significance detected in TSC1 at  p.L439V (c.1315C>G) and in TSC2 at p.D624N (c.1870G>A).  The report date is 10/06/2021.   The CancerNext-Expanded gene panel [facility] includes sequencing, rearrangement, and RNA analysis for the following  77 genes: AIP, ALK, APC, ATM, AXIN2, BAP1, BARD1, BLM, BMPR1A, BRCA1, BRCA2, BRIP1, CDC73, CDH1, CDK4, CDKN1B, CDKN2A, CHEK2, CTNNA1, DICER1, FANCC, FH, FLCN, GALNT12, KIF1B, LZTR1, MAX, MEN1, MET, MLH1, MSH2, MSH3, MSH6, MUTYH, NBN, NF1, NF2, NTHL1, PALB2, PHOX2B, PMS2, POT1, PRKAR1A, PTCH1, PTEN, RAD51C, RAD51D, RB1, RECQL, RET, SDHA, SDHAF2, SDHB, SDHC, SDHD, SMAD4, SMARCA4, SMARCB1, SMARCE1, STK11, SUFU, TMEM127, TP53, TSC1, TSC2, VHL and XRCC2 (sequencing and deletion/duplication); EGFR, EGLN1, HOXB13, KIT, MITF, PDGFRA, POLD1, and POLE (sequencing only); EPCAM and GREM1 (deletion/duplication only).      FAMILY HISTORY:  We obtained a detailed, 4-generation family history.  Significant diagnoses are listed below: Family History  Problem Relation Age of Onset   Pancreatic cancer Half-Brother        d. early 677s     Karen Mills unaware of previous family history of genetic testing for hereditary cancer risks. There is no reported Ashkenazi Jewish ancestry. There is no known consanguinity.  GENETIC TEST RESULTS:  The [facility] CancerNext-Expanded +RNAinsight Panel found no pathogenic mutations. The CancerNext[facility]Expanded gene panel [facility] includes sequencing, rearrangement, and RNA analysis for the following 77 genes: AIP, ALK, APC, ATM, AXIN2, BAP1, BARD1, BLM, BMPR1A, BRCA1, BRCA2, BRIP1, CDC73, CDH1, CDK4, CDKN1B, CDKN2A, CHEK2, CTNNA1, DICER1, FANCC, FH, FLCN, GALNT12, KIF1B, LZTR1, MAX, MEN1, MET, MLH1, MSH2, MSH3, MSH6, MUTYH, NBN, NF1, NF2, NTHL1, PALB2, PHOX2B, PMS2, POT1, PRKAR1A, PTCH1, PTEN, RAD51C, RAD51D, RB1, RECQL, RET, SDHA, SDHAF2, SDHB, SDHC, SDHD, SMAD4, SMARCA4, SMARCB1, SMARCE1, STK11, SUFU, TMEM127, TP53, TSC1, TSC2, VHL and XRCC2 (sequencing and deletion/duplication); EGFR, EGLN1, HOXB13, KIT, MITF, PDGFRA, POLD1, and POLE (sequencing only); EPCAM and GREM1 (deletion/duplication only).  The test report has been scanned into EPIC and is located under the  Molecular Pathology section of the Results Review tab.  A portion of the result report is included below for reference. Genetic testing reported out on October 06, 2021.      Genetic testing identified two variants of uncertain significance (VUS)---one in the TSC1 gene at  p.L439V (c.1315C>G) and the other in TSC2 at p.D624N (c.1870G>A).  At this time, it is unknown if these variants are associated with an increased risk for cancer or if they are benign, but most uncertain variants are reclassified to benign. It should not be used to make medical management decisions. With time, we suspect the laboratory will determine the significance of these variants, if any. If the laboratory reclassifies these variants, we will attempt to contact Karen Mills to discuss it further.   Even though a pathogenic variant was not identified, possible explanations for the cancer in the family may include: There may be no hereditary risk for cancer in the family. The cancers in Karen Mills and/or her family may be sporadic/familial or due to other genetic and environmental factors. There may be a gene mutation in one of these genes that current testing methods cannot detect but that chance is small. There could be another gene that has not yet been discovered, or that we have not yet tested, that is responsible for the cancer diagnoses in the family.  It is also possible there is a hereditary cause for the cancer in the family that Karen Mills did not inherit.   Therefore, it is important to remain in touch with cancer genetics in the future so that we can continue to offer Karen Mills the most up to date genetic testing.    ADDITIONAL GENETIC TESTING:   discussed with Karen Mills that her genetic testing was fairly extensive.  If there are genes identified to increase cancer risk that can be analyzed in the future, we would be happy to discuss and coordinate this testing at that time.    CANCER SCREENING  RECOMMENDATIONS:  Karen Mills test result is considered negative (normal).  This means that we have not identified a hereditary cause for her personal and family history of cancer at this time.   An individual's cancer risk and medical management are not determined by genetic test results alone. Overall cancer risk assessment incorporates additional factors, including personal medical history, family history, and any available genetic information that may result in a personalized plan for cancer prevention and surveillance. Therefore, it is recommended she continue to follow the cancer management and screening guidelines provided by her oncology and primary healthcare provider.  RECOMMENDATIONS FOR FAMILY MEMBERS:   Since she did not inherit a identifiable mutation in a cancer predisposition gene included on this panel, her children could not have inherited a known mutation from her in one of these genes. Individuals in this family might be at some increased risk of developing cancer, over the general population risk, due to the family history of cancer.  Individuals in the family should notify their providers of the family history of cancer. We recommend women in this family have a yearly mammogram beginning at age 53, or 60 years younger than the earliest onset of cancer, an annual clinical breast exam, and perform monthly breast self-exams.   Other members of the family may still carry a pathogenic variant in one of these genes that Karen Mills did not inherit. Based on the family history of pancreatic  cancer in her deceased half brother, we recommend his children have genetic counseling and testing. Ms. Ahola will let us know if we can be of any assistance in coordinating genetic counseling and/or testing for these family members.   We do not recommend familial testing for the TSC1 or TSC2 variants of uncertain significance (VUS).  FOLLOW-UP:  Lastly, we discussed with Karen Mills that cancer  genetics is a rapidly advancing field and it is possible that new genetic tests will be appropriate for her and/or her family members in the future. We encouraged her to remain in contact with cancer genetics on an annual basis so we can update her personal and family histories and let her know of advances in cancer genetics that may benefit this family.   Our contact number was provided. Karen Mills's questions were answered to her satisfaction, and she knows she is welcome to call us at anytime with additional questions or concerns.   Karen Mills M. Joette Catching, [place], [facility] Genetic Counselor [email or web address] (P) [phone]

## 2021-10-12 NOTE — Patient Instructions (Signed)
Medication Instructions:  Your physician recommends that you continue on your current medications as directed. Please refer to the Current Medication list given to you today.  *If you need a refill on your cardiac medications before your next appointment, please call your pharmacy*   Lab Work: None today If you have labs (blood work) drawn today and your tests are completely normal, you will receive your results only by: [place] (if you have MyChart) OR A paper copy in the mail If you have any lab test that is abnormal or we need to change your treatment, we will call you to review the results.   Testing/Procedures: Your physician has requested that you have an echocardiogram. Echocardiography is a painless test that uses sound waves to create images of your heart. It provides your doctor with information about the size and shape of your heart and how well your hearts chambers and valves are working. This procedure takes approximately one hour. There are no restrictions for this procedure.  Calcium score CT scan. 95 dollar out of pocket charge for this.   Follow-Up: As needed

## 2021-10-13 ENCOUNTER — Encounter: Payer: Self-pay | Admitting: Genetic Counselor

## 2021-10-26 ENCOUNTER — Ambulatory Visit (HOSPITAL_COMMUNITY): Payer: Medicare Other | Attending: Internal Medicine

## 2021-10-26 ENCOUNTER — Other Ambulatory Visit: Payer: Self-pay

## 2021-10-26 ENCOUNTER — Ambulatory Visit (INDEPENDENT_AMBULATORY_CARE_PROVIDER_SITE_OTHER)
Admission: RE | Admit: 2021-10-26 | Discharge: 2021-10-26 | Disposition: A | Discharge status: Home / Self Care | Payer: Self-pay | Source: Ambulatory Visit | Attending: Internal Medicine | Admitting: Internal Medicine

## 2021-10-26 DIAGNOSIS — E785 Hyperlipidemia, unspecified: Secondary | ICD-10-CM | POA: Diagnosis not present

## 2021-10-26 DIAGNOSIS — I10 Essential (primary) hypertension: Secondary | ICD-10-CM

## 2021-10-26 DIAGNOSIS — R011 Cardiac murmur, unspecified: Secondary | ICD-10-CM | POA: Diagnosis not present

## 2021-10-26 LAB — ECHOCARDIOGRAM COMPLETE
AR max vel: 1.87 cm2
AV Area VTI: 2.11 cm2
AV Area mean vel: 2.01 cm2
AV Mean grad: 10 mmHg
AV Peak grad: 19.2 mmHg
Ao pk vel: 2.19 m/s
Area-P 1/2: 3.03 cm2
S' Lateral: 3.3 cm

## 2021-10-26 NOTE — Progress Notes (Signed)
Let patient know echo shows normal heart function without significant valvular issues.

## 2021-10-26 NOTE — Progress Notes (Signed)
CTA shows calcium in the arteries that will need medications.  Please start ASA 81 and atorvastatin 40mg .  Lets get FLP and LFTs in 8 weeks and have her see pharmacy for lipid management after that.  Have her f/u with me or APS in 1 yr.

## 2021-10-27 ENCOUNTER — Telehealth: Payer: Self-pay | Admitting: Internal Medicine

## 2021-10-27 DIAGNOSIS — E785 Hyperlipidemia, unspecified: Secondary | ICD-10-CM

## 2021-10-27 MED ORDER — ASPIRIN EC 81 MG PO TBEC: 81.0000 mg | DELAYED_RELEASE_TABLET | Freq: Every day | ORAL | 3 refills | Status: DC

## 2021-10-27 MED ORDER — ATORVASTATIN CALCIUM 40 MG PO TABS: 40.0000 mg | ORAL_TABLET | Freq: Every day | ORAL | 3 refills | Status: DC

## 2021-10-27 NOTE — Telephone Encounter (Signed)
Pt returning call / please advise  

## 2021-10-27 NOTE — Telephone Encounter (Signed)
Early Osmond, MD  10/26/2021  6:29 PM EST     Let patient know echo shows normal heart function without significant valvular issues.   Early Osmond, MD  10/26/2021  4:46 PM EST     CTA shows calcium in the arteries that will need medications.  Please start ASA 81 and atorvastatin 40mg .  Lets get FLP and LFTs in 8 weeks and have her see pharmacy for lipid management after that.  Have her f/u with me or APS in 1 yr.  The patient has been notified of the result and verbalized understanding.  All questions (if any) were answered. Antonieta Iba, RN 10/27/2021 3:09 PM  Rx has been sent in. Labs have been scheduled. Recall has been put in for one year.

## 2021-10-30 ENCOUNTER — Ambulatory Visit: Payer: Medicare Other | Admitting: Internal Medicine

## 2021-11-16 DIAGNOSIS — R509 Fever, unspecified: Secondary | ICD-10-CM | POA: Diagnosis not present

## 2021-11-16 DIAGNOSIS — Z03818 Encounter for observation for suspected exposure to other biological agents ruled out: Secondary | ICD-10-CM | POA: Diagnosis not present

## 2021-11-16 DIAGNOSIS — J029 Acute pharyngitis, unspecified: Secondary | ICD-10-CM | POA: Diagnosis not present

## 2021-11-28 ENCOUNTER — Other Ambulatory Visit: Payer: Self-pay

## 2021-11-28 ENCOUNTER — Emergency Department (HOSPITAL_BASED_OUTPATIENT_CLINIC_OR_DEPARTMENT_OTHER)
Admission: EM | Admit: 2021-11-28 | Discharge: 2021-11-29 | Disposition: A | Discharge status: Home / Self Care | Payer: Medicare Other | Source: Home / Self Care | Attending: Emergency Medicine | Admitting: Emergency Medicine

## 2021-11-28 ENCOUNTER — Encounter (HOSPITAL_BASED_OUTPATIENT_CLINIC_OR_DEPARTMENT_OTHER): Payer: Self-pay | Admitting: Emergency Medicine

## 2021-11-28 DIAGNOSIS — D649 Anemia, unspecified: Secondary | ICD-10-CM | POA: Diagnosis present

## 2021-11-28 DIAGNOSIS — Z8249 Family history of ischemic heart disease and other diseases of the circulatory system: Secondary | ICD-10-CM | POA: Diagnosis not present

## 2021-11-28 DIAGNOSIS — N189 Chronic kidney disease, unspecified: Secondary | ICD-10-CM | POA: Diagnosis not present

## 2021-11-28 DIAGNOSIS — U071 COVID-19: Secondary | ICD-10-CM | POA: Diagnosis not present

## 2021-11-28 DIAGNOSIS — R040 Epistaxis: Secondary | ICD-10-CM | POA: Insufficient documentation

## 2021-11-28 DIAGNOSIS — J9811 Atelectasis: Secondary | ICD-10-CM | POA: Diagnosis not present

## 2021-11-28 DIAGNOSIS — E785 Hyperlipidemia, unspecified: Secondary | ICD-10-CM | POA: Diagnosis not present

## 2021-11-28 DIAGNOSIS — Z86008 Personal history of in-situ neoplasm of other site: Secondary | ICD-10-CM | POA: Diagnosis not present

## 2021-11-28 DIAGNOSIS — E871 Hypo-osmolality and hyponatremia: Secondary | ICD-10-CM | POA: Diagnosis not present

## 2021-11-28 DIAGNOSIS — Z170 Estrogen receptor positive status [ER+]: Secondary | ICD-10-CM | POA: Diagnosis not present

## 2021-11-28 DIAGNOSIS — C50512 Malignant neoplasm of lower-outer quadrant of left female breast: Secondary | ICD-10-CM | POA: Diagnosis not present

## 2021-11-28 DIAGNOSIS — Z7982 Long term (current) use of aspirin: Secondary | ICD-10-CM | POA: Diagnosis not present

## 2021-11-28 DIAGNOSIS — R9431 Abnormal electrocardiogram [ECG] [EKG]: Secondary | ICD-10-CM | POA: Diagnosis not present

## 2021-11-28 DIAGNOSIS — I129 Hypertensive chronic kidney disease with stage 1 through stage 4 chronic kidney disease, or unspecified chronic kidney disease: Secondary | ICD-10-CM | POA: Diagnosis not present

## 2021-11-28 DIAGNOSIS — Z79899 Other long term (current) drug therapy: Secondary | ICD-10-CM | POA: Diagnosis not present

## 2021-11-28 DIAGNOSIS — D62 Acute posthemorrhagic anemia: Secondary | ICD-10-CM | POA: Diagnosis not present

## 2021-11-28 DIAGNOSIS — Z888 Allergy status to other drugs, medicaments and biological substances status: Secondary | ICD-10-CM | POA: Diagnosis not present

## 2021-11-28 DIAGNOSIS — J069 Acute upper respiratory infection, unspecified: Secondary | ICD-10-CM | POA: Diagnosis not present

## 2021-11-28 DIAGNOSIS — J9690 Respiratory failure, unspecified, unspecified whether with hypoxia or hypercapnia: Secondary | ICD-10-CM | POA: Diagnosis not present

## 2021-11-28 DIAGNOSIS — E039 Hypothyroidism, unspecified: Secondary | ICD-10-CM | POA: Diagnosis not present

## 2021-11-28 DIAGNOSIS — M109 Gout, unspecified: Secondary | ICD-10-CM | POA: Diagnosis not present

## 2021-11-28 DIAGNOSIS — Z978 Presence of other specified devices: Secondary | ICD-10-CM | POA: Diagnosis not present

## 2021-11-28 DIAGNOSIS — Z79890 Hormone replacement therapy (postmenopausal): Secondary | ICD-10-CM | POA: Diagnosis not present

## 2021-11-28 DIAGNOSIS — K921 Melena: Secondary | ICD-10-CM | POA: Diagnosis not present

## 2021-11-28 DIAGNOSIS — N184 Chronic kidney disease, stage 4 (severe): Secondary | ICD-10-CM | POA: Diagnosis not present

## 2021-11-28 DIAGNOSIS — Z923 Personal history of irradiation: Secondary | ICD-10-CM | POA: Diagnosis not present

## 2021-11-28 DIAGNOSIS — E876 Hypokalemia: Secondary | ICD-10-CM | POA: Diagnosis not present

## 2021-11-28 MED ORDER — OXYMETAZOLINE HCL 0.05 % NA SOLN
1.0000 | Freq: Once | NASAL | Status: AC
  Administered 2021-11-28: 1 via NASAL
  Filled 2021-11-28: qty 30

## 2021-11-28 MED ORDER — LIDOCAINE HCL 2 % IJ SOLN
10.0000 mL | Freq: Once | INTRAMUSCULAR | Status: AC
  Administered 2021-11-29: 200 mg
  Filled 2021-11-28: qty 20

## 2021-11-28 NOTE — ED Provider Notes (Signed)
[facility] Provider Note   CSN: [id number] Arrival date & time: 11/28/21  1859     History  Chief Complaint  Patient presents with   Epistaxis    Arlyn DASHANNA KINNAMON is a 66 y.o. female.  Pt is a 66 yo female presenting for epistaxis. States she has upper respiratory infection currently and has been sneezing a lot. States this morning she began having left sided nose bleed. States she was given Afrin by triage team here at [facility] with little improvement. Denies prior nose bleeds. Denies blood thinner use.   The history is provided by the patient. No language interpreter was used.  Epistaxis Associated symptoms: no cough, no fever and no sore throat       Home Medications Prior to Admission medications   Medication Sig Start Date End Date Taking? Authorizing Provider  aspirin EC 81 MG tablet Take 1 tablet (81 mg total) by mouth daily. Swallow whole. 10/27/21   Early Osmond, MD  atorvastatin (LIPITOR) 40 MG tablet Take 1 tablet (40 mg total) by mouth daily. 10/27/21   Early Osmond, MD  allopurinol (ZYLOPRIM) 100 MG tablet Take 1 tablet (100 mg total) by mouth daily. 08/24/20   Wallene Huh, DPM  B Complex-C (SUPER B COMPLEX PO) Take by mouth.    [provider]  Biotin 5 MG CAPS Take by mouth daily. 2 capsules once a day.    [provider]  carvedilol (COREG) 3.125 MG tablet Take 3.125 mg by mouth 2 (two) times daily with a meal.    [provider]  cholecalciferol (VITAMIN D3) 25 MCG (1000 UT) tablet Take 1,000 Units by mouth daily.    [provider]  colchicine 0.6 MG tablet TAKE 1 TABLET BY MOUTH EVERY DAY 09/04/21   Wallene Huh, DPM  furosemide (LASIX) 20 MG tablet Take 20 mg by mouth daily.    [provider]  hydrALAZINE (APRESOLINE) 25 MG tablet Take 25 mg by mouth 3 (three) times daily. 06/29/16   [provider]  levothyroxine (SYNTHROID) 100 MCG tablet Take 100 mcg by mouth  daily. 08/08/21   [provider]  liothyronine (CYTOMEL) 5 MCG tablet Take 5 mcg by mouth daily. 09/22/21   [provider]  Omega-3 Fatty Acids (FISH OIL PO) Take 1 tablet by mouth daily.    [provider]  pyridOXINE (VITAMIN B-6) 100 MG tablet Take 100 mg by mouth daily.    [provider]  tamoxifen (NOLVADEX) 10 MG tablet Take 1 tablet (10 mg total) by mouth daily. 08/14/21   Nicholas Lose, MD  vitamin E 400 UNIT capsule Take 670 Units by mouth daily.    [provider]      Allergies    Accupril [quinapril hcl], Amlodipine besylate, Atacand [candesartan], Cozaar [losartan potassium], Maxzide [hydrochlorothiazide w-triamterene], Tiazac [diltiazem hcl er beads], and Zestril [lisinopril]    Review of Systems   Review of Systems  Constitutional:  Negative for chills and fever.  HENT:  Positive for nosebleeds. Negative for ear pain and sore throat.   Eyes:  Negative for pain and visual disturbance.  Respiratory:  Negative for cough and shortness of breath.   Cardiovascular:  Negative for chest pain and palpitations.  Gastrointestinal:  Negative for abdominal pain and vomiting.  Genitourinary:  Negative for dysuria and hematuria.  Musculoskeletal:  Negative for arthralgias and back pain.  Skin:  Negative for color change and rash.  Neurological:  Negative  for seizures and syncope.  All other systems reviewed and are negative.  Physical Exam Updated Vital Signs BP (!) 177/78 (BP Location: Left Arm)    Pulse 89    Temp 98.4 F (36.9 C) (Oral)    Resp 20    Ht 5\' 8"  (1.727 m)    Wt 89.8 kg    SpO2 95%    BMI 30.11 kg/m  Physical Exam Vitals and nursing note reviewed.  Constitutional:      General: She is not in acute distress.    Appearance: She is well-developed.  HENT:     Head: Normocephalic and atraumatic.  Eyes:     Conjunctiva/sclera: Conjunctivae normal.  Cardiovascular:     Rate and Rhythm: Normal rate and regular rhythm.      Heart sounds: No murmur heard. Pulmonary:     Effort: Pulmonary effort is normal. No respiratory distress.     Breath sounds: Normal breath sounds.  Musculoskeletal:     Cervical back: Neck supple.  Skin:    General: Skin is warm and dry.  Neurological:     Mental Status: She is alert.    ED Results / Procedures / Treatments   Labs (all labs ordered are listed, but only abnormal results are displayed) Labs Reviewed - No data to display  EKG None  Radiology No results found.  Procedures Procedures    Medications Ordered in ED Medications  oxymetazoline (AFRIN) 0.05 % nasal spray 1 spray (1 spray Each Nare Given 11/28/21 1923)    ED Course/ Medical Decision Making/ A&P                           Medical Decision Making Risk Prescription drug management.   11:22 PM 66 yo female presenting for epistaxis.66 yo female presenting for epistaxis.  Symptoms impoved after insertion of intranasal cotton ball soaked in lidocaine and afrin with nasal compression.  Patient in no distress and overall condition improved here in the ED. Detailed discussions were had with the patient regarding current findings, and need for close f/u with PCP or on call doctor. The patient has been instructed to return immediately if the symptoms worsen in any way for re-evaluation. Patient verbalized understanding and is in agreement with current care plan. All questions answered prior to discharge.         Final Clinical Impression(s) / ED Diagnoses Final diagnoses:  Epistaxis    Rx / DC Orders ED Discharge Orders     None         Lianne Cure, DO 20/35/59 1116

## 2021-11-28 NOTE — ED Triage Notes (Signed)
Pt c/o left sided nose bleeding. Pt dx with URI today and started on prednisone.

## 2021-11-29 NOTE — Discharge Instructions (Signed)
Return to ED if symptoms return and do not resolve with Afrin on cotton ball intranasally with compression

## 2021-12-01 ENCOUNTER — Inpatient Hospital Stay (HOSPITAL_COMMUNITY): Payer: Medicare Other

## 2021-12-01 ENCOUNTER — Observation Stay (HOSPITAL_COMMUNITY): Payer: Medicare Other | Admitting: Anesthesiology

## 2021-12-01 ENCOUNTER — Observation Stay (HOSPITAL_COMMUNITY): Payer: Medicare Other

## 2021-12-01 ENCOUNTER — Encounter (HOSPITAL_COMMUNITY)
Admission: EM | Disposition: A | Discharge status: Home / Self Care | Payer: Self-pay | Source: Home / Self Care | Attending: Family Medicine

## 2021-12-01 ENCOUNTER — Inpatient Hospital Stay (HOSPITAL_COMMUNITY)
Admission: EM | Admit: 2021-12-01 | Discharge: 2021-12-04 | DRG: 143 | Disposition: A | Discharge status: Home / Self Care | Payer: Medicare Other | Attending: Family Medicine | Admitting: Family Medicine

## 2021-12-01 ENCOUNTER — Other Ambulatory Visit: Payer: Self-pay

## 2021-12-01 ENCOUNTER — Encounter (HOSPITAL_COMMUNITY): Payer: Self-pay

## 2021-12-01 DIAGNOSIS — C50512 Malignant neoplasm of lower-outer quadrant of left female breast: Secondary | ICD-10-CM | POA: Diagnosis not present

## 2021-12-01 DIAGNOSIS — Z8249 Family history of ischemic heart disease and other diseases of the circulatory system: Secondary | ICD-10-CM

## 2021-12-01 DIAGNOSIS — E039 Hypothyroidism, unspecified: Secondary | ICD-10-CM | POA: Diagnosis present

## 2021-12-01 DIAGNOSIS — N184 Chronic kidney disease, stage 4 (severe): Secondary | ICD-10-CM | POA: Diagnosis not present

## 2021-12-01 DIAGNOSIS — U071 COVID-19: Secondary | ICD-10-CM | POA: Diagnosis not present

## 2021-12-01 DIAGNOSIS — I129 Hypertensive chronic kidney disease with stage 1 through stage 4 chronic kidney disease, or unspecified chronic kidney disease: Secondary | ICD-10-CM | POA: Diagnosis not present

## 2021-12-01 DIAGNOSIS — Z978 Presence of other specified devices: Secondary | ICD-10-CM | POA: Diagnosis not present

## 2021-12-01 DIAGNOSIS — Z79899 Other long term (current) drug therapy: Secondary | ICD-10-CM | POA: Diagnosis not present

## 2021-12-01 DIAGNOSIS — Z888 Allergy status to other drugs, medicaments and biological substances status: Secondary | ICD-10-CM

## 2021-12-01 DIAGNOSIS — Z86008 Personal history of in-situ neoplasm of other site: Secondary | ICD-10-CM

## 2021-12-01 DIAGNOSIS — R040 Epistaxis: Secondary | ICD-10-CM | POA: Diagnosis not present

## 2021-12-01 DIAGNOSIS — E785 Hyperlipidemia, unspecified: Secondary | ICD-10-CM | POA: Diagnosis present

## 2021-12-01 DIAGNOSIS — N189 Chronic kidney disease, unspecified: Secondary | ICD-10-CM | POA: Diagnosis not present

## 2021-12-01 DIAGNOSIS — Z923 Personal history of irradiation: Secondary | ICD-10-CM | POA: Diagnosis not present

## 2021-12-01 DIAGNOSIS — E871 Hypo-osmolality and hyponatremia: Secondary | ICD-10-CM | POA: Diagnosis not present

## 2021-12-01 DIAGNOSIS — Z170 Estrogen receptor positive status [ER+]: Secondary | ICD-10-CM

## 2021-12-01 DIAGNOSIS — D649 Anemia, unspecified: Secondary | ICD-10-CM | POA: Diagnosis present

## 2021-12-01 DIAGNOSIS — Z79890 Hormone replacement therapy (postmenopausal): Secondary | ICD-10-CM | POA: Diagnosis not present

## 2021-12-01 DIAGNOSIS — E876 Hypokalemia: Secondary | ICD-10-CM | POA: Diagnosis not present

## 2021-12-01 DIAGNOSIS — Z7982 Long term (current) use of aspirin: Secondary | ICD-10-CM

## 2021-12-01 DIAGNOSIS — M109 Gout, unspecified: Secondary | ICD-10-CM | POA: Diagnosis present

## 2021-12-01 DIAGNOSIS — D62 Acute posthemorrhagic anemia: Secondary | ICD-10-CM | POA: Diagnosis present

## 2021-12-01 DIAGNOSIS — K921 Melena: Secondary | ICD-10-CM | POA: Diagnosis not present

## 2021-12-01 DIAGNOSIS — I10 Essential (primary) hypertension: Secondary | ICD-10-CM | POA: Diagnosis present

## 2021-12-01 HISTORY — PX: IR ANGIO VERTEBRAL SEL VERTEBRAL UNI R MOD SED: IMG5368

## 2021-12-01 HISTORY — PX: IR NEURO EACH ADD'L AFTER BASIC UNI RIGHT (MS): IMG5374

## 2021-12-01 HISTORY — PX: IR ANGIO EXTERNAL CAROTID SEL EXT CAROTID BILAT MOD SED: IMG5372

## 2021-12-01 HISTORY — PX: IR ANGIOGRAM FOLLOW UP STUDY: IMG697

## 2021-12-01 HISTORY — PX: IR ANGIO INTRA EXTRACRAN SEL COM CAROTID INNOMINATE BILAT MOD SED: IMG5360

## 2021-12-01 HISTORY — PX: IR TRANSCATH/EMBOLIZ: IMG695

## 2021-12-01 HISTORY — PX: IR NEURO EACH ADD'L AFTER BASIC UNI LEFT (MS): IMG5373

## 2021-12-01 HISTORY — PX: RADIOLOGY WITH ANESTHESIA: SHX6223

## 2021-12-01 LAB — COMPREHENSIVE METABOLIC PANEL
ALT: 14 U/L (ref 0–44)
AST: 18 U/L (ref 15–41)
Albumin: 2.5 g/dL — ABNORMAL LOW (ref 3.5–5.0)
Alkaline Phosphatase: 38 U/L (ref 38–126)
Anion gap: 12 (ref 5–15)
BUN: 74 mg/dL — ABNORMAL HIGH (ref 8–23)
CO2: 15 mmol/L — ABNORMAL LOW (ref 22–32)
Calcium: 8.4 mg/dL — ABNORMAL LOW (ref 8.9–10.3)
Chloride: 99 mmol/L (ref 98–111)
Creatinine, Ser: 1.69 mg/dL — ABNORMAL HIGH (ref 0.44–1.00)
GFR, Estimated: 33 mL/min — ABNORMAL LOW (ref >60)
Glucose, Bld: 121 mg/dL — ABNORMAL HIGH (ref 70–99)
Potassium: 3.1 mmol/L — ABNORMAL LOW (ref 3.5–5.1)
Sodium: 126 mmol/L — ABNORMAL LOW (ref 135–145)
Total Bilirubin: 0.2 mg/dL — ABNORMAL LOW (ref 0.3–1.2)
Total Protein: 5.6 g/dL — ABNORMAL LOW (ref 6.5–8.1)

## 2021-12-01 LAB — BRAIN NATRIURETIC PEPTIDE: B Natriuretic Peptide: 63.2 pg/mL (ref 0.0–100.0)

## 2021-12-01 LAB — CBC
HCT: 15.7 % — ABNORMAL LOW (ref 36.0–46.0)
HCT: 22.6 % — ABNORMAL LOW (ref 36.0–46.0)
Hemoglobin: 5.2 g/dL — CL (ref 12.0–15.0)
Hemoglobin: 8 g/dL — ABNORMAL LOW (ref 12.0–15.0)
MCH: 34.7 pg — ABNORMAL HIGH (ref 26.0–34.0)
MCH: 33.8 pg (ref 26.0–34.0)
MCHC: 33.1 g/dL (ref 30.0–36.0)
MCHC: 35.4 g/dL (ref 30.0–36.0)
MCV: 104.7 fL — ABNORMAL HIGH (ref 80.0–100.0)
MCV: 95.4 fL (ref 80.0–100.0)
Platelets: 354 10*3/uL (ref 150–400)
Platelets: 294 10*3/uL (ref 150–400)
RBC: 1.5 MIL/uL — ABNORMAL LOW (ref 3.87–5.11)
RBC: 2.37 MIL/uL — ABNORMAL LOW (ref 3.87–5.11)
RDW: 13.7 % (ref 11.5–15.5)
RDW: 15.6 % — ABNORMAL HIGH (ref 11.5–15.5)
WBC: 15.4 10*3/uL — ABNORMAL HIGH (ref 4.0–10.5)
WBC: 10.3 10*3/uL (ref 4.0–10.5)
nRBC: 0.2 % (ref 0.0–0.2)
nRBC: 0.2 % (ref 0.0–0.2)

## 2021-12-01 LAB — RESP PANEL BY RT-PCR (FLU A&B, COVID) ARPGX2
Influenza A by PCR: NEGATIVE
Influenza B by PCR: NEGATIVE
SARS Coronavirus 2 by RT PCR: POSITIVE — AB

## 2021-12-01 LAB — PROTIME-INR
INR: 0.9 (ref 0.8–1.2)
Prothrombin Time: 12.6 seconds (ref 11.4–15.2)

## 2021-12-01 LAB — ABO/RH: ABO/RH(D): O POS

## 2021-12-01 LAB — PREPARE RBC (CROSSMATCH)

## 2021-12-01 LAB — POCT ACTIVATED CLOTTING TIME: Activated Clotting Time: 197 seconds

## 2021-12-01 SURGERY — RADIOLOGY WITH ANESTHESIA
Anesthesia: General

## 2021-12-01 MED ORDER — SODIUM CHLORIDE 0.9% IV SOLUTION: Freq: Once | INTRAVENOUS | Status: AC

## 2021-12-01 MED ORDER — HEPARIN SODIUM (PORCINE) 1000 UNIT/ML IJ SOLN
INTRAMUSCULAR | Status: DC | PRN
  Administered 2021-12-01: 1000 [IU] via INTRAVENOUS
  Administered 2021-12-01: 2000 [IU] via INTRAVENOUS

## 2021-12-01 MED ORDER — PHENYLEPHRINE 40 MCG/ML (10ML) SYRINGE FOR IV PUSH (FOR BLOOD PRESSURE SUPPORT)
PREFILLED_SYRINGE | INTRAVENOUS | Status: DC | PRN
  Administered 2021-12-01: 80 ug via INTRAVENOUS

## 2021-12-01 MED ORDER — LIOTHYRONINE SODIUM 5 MCG PO TABS
5.0000 ug | ORAL_TABLET | Freq: Every day | ORAL | Status: DC
  Administered 2021-12-02 – 2021-12-04 (×3): 5 ug via ORAL
  Filled 2021-12-01 (×6): qty 1

## 2021-12-01 MED ORDER — IOHEXOL 350 MG/ML SOLN
100.0000 mL | Freq: Once | INTRAVENOUS | Status: AC | PRN
  Administered 2021-12-01: 50 mL via INTRA_ARTERIAL

## 2021-12-01 MED ORDER — CEFAZOLIN SODIUM-DEXTROSE 2-4 GM/100ML-% IV SOLN
INTRAVENOUS | Status: AC
  Filled 2021-12-01: qty 100

## 2021-12-01 MED ORDER — ACETAMINOPHEN 160 MG/5ML PO SOLN: 650.0000 mg | ORAL | Status: DC | PRN

## 2021-12-01 MED ORDER — SUCCINYLCHOLINE CHLORIDE 200 MG/10ML IV SOSY
PREFILLED_SYRINGE | INTRAVENOUS | Status: DC | PRN
  Administered 2021-12-01: 200 mg via INTRAVENOUS

## 2021-12-01 MED ORDER — MIDAZOLAM HCL 2 MG/2ML IJ SOLN
INTRAMUSCULAR | Status: DC | PRN
  Administered 2021-12-01: 2 mg via INTRAVENOUS

## 2021-12-01 MED ORDER — IOHEXOL 350 MG/ML SOLN
100.0000 mL | Freq: Once | INTRAVENOUS | Status: AC | PRN
  Administered 2021-12-01: 30 mL via INTRA_ARTERIAL

## 2021-12-01 MED ORDER — SODIUM CHLORIDE 0.9 % IV SOLN: INTRAVENOUS | Status: DC

## 2021-12-01 MED ORDER — FENTANYL CITRATE PF 50 MCG/ML IJ SOSY
25.0000 ug | PREFILLED_SYRINGE | INTRAMUSCULAR | Status: DC | PRN
  Administered 2021-12-02: 50 ug via INTRAVENOUS
  Filled 2021-12-01: qty 1

## 2021-12-01 MED ORDER — SODIUM CHLORIDE 0.9% FLUSH
3.0000 mL | Freq: Two times a day (BID) | INTRAVENOUS | Status: DC
  Administered 2021-12-02 – 2021-12-04 (×5): 3 mL via INTRAVENOUS

## 2021-12-01 MED ORDER — ROCURONIUM BROMIDE 10 MG/ML (PF) SYRINGE
PREFILLED_SYRINGE | INTRAVENOUS | Status: DC | PRN
  Administered 2021-12-01: 100 mg via INTRAVENOUS
  Administered 2021-12-01: 50 mg via INTRAVENOUS

## 2021-12-01 MED ORDER — LEVOTHYROXINE SODIUM 100 MCG PO TABS
100.0000 ug | ORAL_TABLET | Freq: Every day | ORAL | Status: DC
  Administered 2021-12-02 – 2021-12-04 (×3): 100 ug via ORAL
  Filled 2021-12-01 (×3): qty 1

## 2021-12-01 MED ORDER — ACETAMINOPHEN 650 MG RE SUPP: 650.0000 mg | Freq: Four times a day (QID) | RECTAL | Status: DC | PRN

## 2021-12-01 MED ORDER — ONDANSETRON HCL 4 MG/2ML IJ SOLN
4.0000 mg | Freq: Once | INTRAMUSCULAR | Status: AC
  Administered 2021-12-01: 4 mg via INTRAVENOUS
  Filled 2021-12-01: qty 2

## 2021-12-01 MED ORDER — ONDANSETRON HCL 4 MG/2ML IJ SOLN
INTRAMUSCULAR | Status: DC | PRN
  Administered 2021-12-01: 4 mg via INTRAVENOUS

## 2021-12-01 MED ORDER — OXYCODONE HCL 5 MG PO TABS
5.0000 mg | ORAL_TABLET | ORAL | Status: DC | PRN
  Administered 2021-12-02: 5 mg via ORAL
  Filled 2021-12-01 (×2): qty 1

## 2021-12-01 MED ORDER — ACETAMINOPHEN 650 MG RE SUPP: 650.0000 mg | RECTAL | Status: DC | PRN

## 2021-12-01 MED ORDER — CEFAZOLIN SODIUM-DEXTROSE 2-3 GM-%(50ML) IV SOLR
INTRAVENOUS | Status: DC | PRN
  Administered 2021-12-01: 2 g via INTRAVENOUS

## 2021-12-01 MED ORDER — DEXAMETHASONE SODIUM PHOSPHATE 10 MG/ML IJ SOLN
INTRAMUSCULAR | Status: DC | PRN
  Administered 2021-12-01: 10 mg via INTRAVENOUS

## 2021-12-01 MED ORDER — CLEVIDIPINE BUTYRATE 0.5 MG/ML IV EMUL
0.0000 mg/h | INTRAVENOUS | Status: AC
  Administered 2021-12-01: 12 mg/h via INTRAVENOUS
  Administered 2021-12-01: 2 mg/h via INTRAVENOUS
  Filled 2021-12-01 (×3): qty 50

## 2021-12-01 MED ORDER — HYDRALAZINE HCL 50 MG PO TABS
100.0000 mg | ORAL_TABLET | Freq: Three times a day (TID) | ORAL | Status: DC
  Administered 2021-12-01 – 2021-12-04 (×9): 100 mg via ORAL
  Filled 2021-12-01 (×9): qty 2

## 2021-12-01 MED ORDER — DOCUSATE SODIUM 50 MG/5ML PO LIQD: 100.0000 mg | Freq: Two times a day (BID) | ORAL | Status: DC

## 2021-12-01 MED ORDER — FAMOTIDINE IN NACL 20-0.9 MG/50ML-% IV SOLN
20.0000 mg | Freq: Every day | INTRAVENOUS | Status: DC
  Administered 2021-12-02: 20 mg via INTRAVENOUS
  Filled 2021-12-01 (×3): qty 50

## 2021-12-01 MED ORDER — POTASSIUM CHLORIDE CRYS ER 20 MEQ PO TBCR
40.0000 meq | EXTENDED_RELEASE_TABLET | Freq: Once | ORAL | Status: AC
  Administered 2021-12-01: 40 meq via ORAL
  Filled 2021-12-01: qty 2

## 2021-12-01 MED ORDER — MORPHINE SULFATE (PF) 2 MG/ML IV SOLN: 2.0000 mg | INTRAVENOUS | Status: DC | PRN

## 2021-12-01 MED ORDER — NITROGLYCERIN 1 MG/10 ML FOR IR/CATH LAB
INTRA_ARTERIAL | Status: AC
  Filled 2021-12-01: qty 10

## 2021-12-01 MED ORDER — BISACODYL 5 MG PO TBEC: 5.0000 mg | DELAYED_RELEASE_TABLET | Freq: Every day | ORAL | Status: DC | PRN

## 2021-12-01 MED ORDER — PROPOFOL 10 MG/ML IV BOLUS
INTRAVENOUS | Status: DC | PRN
  Administered 2021-12-01: 160 mg via INTRAVENOUS

## 2021-12-01 MED ORDER — HYDRALAZINE HCL 20 MG/ML IJ SOLN
10.0000 mg | INTRAMUSCULAR | Status: DC | PRN
  Administered 2021-12-02 – 2021-12-03 (×2): 10 mg via INTRAVENOUS
  Filled 2021-12-01 (×2): qty 1

## 2021-12-01 MED ORDER — ONDANSETRON HCL 4 MG/2ML IJ SOLN: 4.0000 mg | Freq: Four times a day (QID) | INTRAMUSCULAR | Status: DC | PRN

## 2021-12-01 MED ORDER — LIDOCAINE 2% (20 MG/ML) 5 ML SYRINGE
INTRAMUSCULAR | Status: DC | PRN
  Administered 2021-12-01: 40 mg via INTRAVENOUS

## 2021-12-01 MED ORDER — POLYETHYLENE GLYCOL 3350 17 G PO PACK
17.0000 g | PACK | Freq: Every day | ORAL | Status: DC | PRN
  Administered 2021-12-03 – 2021-12-04 (×2): 17 g via ORAL
  Filled 2021-12-01: qty 1

## 2021-12-01 MED ORDER — FENTANYL CITRATE (PF) 250 MCG/5ML IJ SOLN
INTRAMUSCULAR | Status: AC
  Filled 2021-12-01: qty 5

## 2021-12-01 MED ORDER — IOHEXOL 300 MG/ML  SOLN
100.0000 mL | Freq: Once | INTRAMUSCULAR | Status: AC | PRN
  Administered 2021-12-01: 50 mL via INTRA_ARTERIAL

## 2021-12-01 MED ORDER — SODIUM CHLORIDE 0.9 % IV BOLUS
1000.0000 mL | Freq: Once | INTRAVENOUS | Status: AC
  Administered 2021-12-01: 1000 mL via INTRAVENOUS

## 2021-12-01 MED ORDER — HYDRALAZINE HCL 20 MG/ML IJ SOLN: 5.0000 mg | INTRAMUSCULAR | Status: DC | PRN

## 2021-12-01 MED ORDER — CHLORHEXIDINE GLUCONATE CLOTH 2 % EX PADS
6.0000 | MEDICATED_PAD | Freq: Every day | CUTANEOUS | Status: DC
  Administered 2021-12-03: 6 via TOPICAL

## 2021-12-01 MED ORDER — IOHEXOL 300 MG/ML  SOLN
100.0000 mL | Freq: Once | INTRAMUSCULAR | Status: AC | PRN
  Administered 2021-12-01: 45 mL via INTRA_ARTERIAL

## 2021-12-01 MED ORDER — ACETAMINOPHEN 325 MG PO TABS: 650.0000 mg | ORAL_TABLET | Freq: Four times a day (QID) | ORAL | Status: DC | PRN

## 2021-12-01 MED ORDER — LIDOCAINE HCL (PF) 1 % IJ SOLN
10.0000 mL | Freq: Once | INTRAMUSCULAR | Status: AC
  Administered 2021-12-01: 10 mL
  Filled 2021-12-01: qty 10

## 2021-12-01 MED ORDER — MIDAZOLAM HCL 2 MG/2ML IJ SOLN
INTRAMUSCULAR | Status: AC
  Filled 2021-12-01: qty 2

## 2021-12-01 MED ORDER — FENTANYL CITRATE (PF) 250 MCG/5ML IJ SOLN
INTRAMUSCULAR | Status: DC | PRN
Start: 2021-12-01 — End: 2021-12-01
  Administered 2021-12-01: 150 ug via INTRAVENOUS

## 2021-12-01 MED ORDER — ACETAMINOPHEN 325 MG PO TABS
650.0000 mg | ORAL_TABLET | ORAL | Status: DC | PRN
Start: 2021-12-01 — End: 2021-12-04

## 2021-12-01 MED ORDER — CARVEDILOL 6.25 MG PO TABS
6.2500 mg | ORAL_TABLET | Freq: Two times a day (BID) | ORAL | Status: DC
  Administered 2021-12-01 – 2021-12-04 (×6): 6.25 mg via ORAL
  Filled 2021-12-01 (×3): qty 1
  Filled 2021-12-01 (×2): qty 2
  Filled 2021-12-01: qty 1

## 2021-12-01 MED ORDER — TAMOXIFEN CITRATE 10 MG PO TABS
10.0000 mg | ORAL_TABLET | Freq: Every morning | ORAL | Status: DC
Start: 2021-12-01 — End: 2021-12-04
  Administered 2021-12-01 – 2021-12-04 (×4): 10 mg via ORAL
  Filled 2021-12-01 (×4): qty 1

## 2021-12-01 MED ORDER — PHENYLEPHRINE HCL-NACL 20-0.9 MG/250ML-% IV SOLN
INTRAVENOUS | Status: DC | PRN
  Administered 2021-12-01: 40 ug/min via INTRAVENOUS

## 2021-12-01 MED ORDER — POLYETHYLENE GLYCOL 3350 17 G PO PACK
17.0000 g | PACK | Freq: Every day | ORAL | Status: DC
  Filled 2021-12-01: qty 1

## 2021-12-01 MED ORDER — ONDANSETRON HCL 4 MG PO TABS: 4.0000 mg | ORAL_TABLET | Freq: Four times a day (QID) | ORAL | Status: DC | PRN

## 2021-12-01 MED ORDER — DOCUSATE SODIUM 100 MG PO CAPS
100.0000 mg | ORAL_CAPSULE | Freq: Two times a day (BID) | ORAL | Status: DC
  Administered 2021-12-01: 100 mg via ORAL
  Filled 2021-12-01: qty 1

## 2021-12-01 MED ORDER — LACTATED RINGERS IV SOLN: INTRAVENOUS | Status: DC

## 2021-12-01 MED ORDER — FENTANYL CITRATE PF 50 MCG/ML IJ SOSY
25.0000 ug | PREFILLED_SYRINGE | INTRAMUSCULAR | Status: DC | PRN
  Administered 2021-12-01: 25 ug via INTRAVENOUS
  Filled 2021-12-01: qty 1

## 2021-12-01 MED ORDER — DEXMEDETOMIDINE HCL IN NACL 400 MCG/100ML IV SOLN
0.0000 ug/kg/h | INTRAVENOUS | Status: DC
  Administered 2021-12-01: 0.4 ug/kg/h via INTRAVENOUS
  Administered 2021-12-02: 1 ug/kg/h via INTRAVENOUS
  Filled 2021-12-01 (×2): qty 100

## 2021-12-01 MED ORDER — SODIUM CHLORIDE 0.9 % IV SOLN
3.0000 g | Freq: Four times a day (QID) | INTRAVENOUS | Status: DC
  Administered 2021-12-02 – 2021-12-04 (×11): 3 g via INTRAVENOUS
  Filled 2021-12-01 (×18): qty 8

## 2021-12-01 MED ORDER — LIDOCAINE HCL 1 % IJ SOLN
INTRAMUSCULAR | Status: AC
  Filled 2021-12-01: qty 20

## 2021-12-01 MED ORDER — ALLOPURINOL 300 MG PO TABS
300.0000 mg | ORAL_TABLET | Freq: Every morning | ORAL | Status: DC
  Administered 2021-12-01 – 2021-12-04 (×4): 300 mg via ORAL
  Filled 2021-12-01: qty 3
  Filled 2021-12-01 (×3): qty 1

## 2021-12-01 NOTE — Assessment & Plan Note (Signed)
-  She does not appear to be taking medications for this issue at this time

## 2021-12-01 NOTE — Anesthesia Procedure Notes (Signed)
Procedure Name: Intubation Date/Time: 12/01/2021 6:40 PM Performed by: Lorie Phenix, CRNA Pre-anesthesia Checklist: Patient identified, Emergency Drugs available, Suction available and Patient being monitored Patient Re-evaluated:Patient Re-evaluated prior to induction Oxygen Delivery Method: Circle system utilized Preoxygenation: Pre-oxygenation with 100% oxygen Induction Type: IV induction Ventilation: Mask ventilation without difficulty Laryngoscope Size: Mac and 3 Grade View: Grade I Tube type: Oral Tube size: 7.5 mm Number of attempts: 1 Airway Equipment and Method: Stylet Placement Confirmation: ETT inserted through vocal cords under direct vision, positive ETCO2 and breath sounds checked- equal and bilateral Secured at: 22 cm Tube secured with: Tape Dental Injury: Teeth and Oropharynx as per pre-operative assessment

## 2021-12-01 NOTE — Assessment & Plan Note (Signed)
-  She is taking Cytomel and Synthroid; while unusual, this does occur periodically and so will continue for now

## 2021-12-01 NOTE — Progress Notes (Addendum)
Placed phone call to patient's room with no answer, called cell phone with answer from daughter who is in the patient's room currently. Eliseo Gum, NP also at bedside during conversation.   Per daughter bleeding has ceased, previously was only bleeding from the left nare. She states she has completed 2 units of blood.   Dr. Estanislado Pandy discussed cerebral angiogram with possible bilateral embolization under general anesthesia - risks, including the possible need for repeat procedure, benefits and alternatives discussed in detail with patient and her daughter. All questions answered, patient gives verbal consent over the phone to proceed - witnessed by this Probation officer.  Verbal consent obtained via phone, consent form in IR.  Candiss Norse, PA-C

## 2021-12-01 NOTE — Progress Notes (Signed)
Patient is now bleeding from the R nare.  I called Dr. Janace Hoard and he said that since she has now failed 3 nasal packings that our next step is to call IR for consideration of embolization.  I spoke with Dr. Kathlene Cote who explained that I need to call neurointerventional.  I spoke with Dr. Kathee Delton.  She needs to be NPO, has only had a couple of ice chips today.  I will notify PCCM, as she will need to be admitted to their service post-procedure.  Will cover with staph antibiotic.  Carlyon Shadow, M.D.

## 2021-12-01 NOTE — H&P (Signed)
History and Physical    Patient: Karen Mills FXT:[id number] DOB: 1956-09-18 DOA: 12/01/2021 DOS: the patient was seen and examined on 12/01/2021 PCP: Lawerance Cruel, MD  Patient coming from: Home - lives with husband; NOK: Husband, [phone]   Chief Complaint: Epistaxis  HPI: Karen Mills is a 66 y.o. female with medical history significant of breast cancer (2020); HTN; HLD; and hypothyroidism presenting with epistaxis.  She was seen in the ER for this on 2/21; bleeding was controlled and she was discharged.  She first started bleeding on Tuesday.  She blew her nose and it was bloody.  She started blowing out "chunks".  She went to UC first and they prescribed prednisone 20 mg daily for 5 days for URI (her ears are stopped up).  The bleeding stopped and then recurred about 10-12 hours later.  She then went to [facility] and she got Afrin and Lidocaine in her nose with some improvement.  About 12 hours later, the bleeding restarted.  They have tried this again and it just won't stop.  Lots of blood.  +light-headed/dizzy with ambulation.  Some DOE.  No prior h/o epistaxis.  Stools are black.  She is not otherwise bleeding to her knowledge.    ER Course:  Posterior nosebleed for several days.  Hgb 5.3, giving blood.  No AC or bleeding disorders.  Has posterior pack in and the bleeding has stopped.  ENT will consult.       Review of Systems: As mentioned in the history of present illness. All other systems reviewed and are negative. Past Medical History:  Diagnosis Date   Breast cancer ([place]) 2020   Left Breast Cancer   CKD (chronic kidney disease)    Gout    Hyperlipidemia    Hypertension    Hypothyroidism    Megaloblastic anemia    Obesity    Personal history of radiation therapy 2020   Left Breast Cancer   PONV (postoperative nausea and vomiting)    SCC (squamous cell carcinoma) Keratoacanthoma 10/10/2016   Right Shin (Cx3,5FU)   Squamous cell carcinoma in situ (SCCIS)  11/15/2016   Right Lower Shin (tx p bx)   Varicose veins    Past Surgical History:  Procedure Laterality Date   ABLATION ON ENDOMETRIOSIS     BREAST LUMPECTOMY Left 04/30/2019   BREAST LUMPECTOMY WITH RADIOACTIVE SEED AND SENTINEL LYMPH NODE BIOPSY Left 04/30/2019   Procedure: LEFT BREAST LUMPECTOMY WITH RADIOACTIVE SEED AND SENTINEL LYMPH NODE BIOPSY;  Surgeon: Jovita Kussmaul, MD;  Location: [facility];  Service: General;  Laterality: Left;   FOOT SURGERY     TONSILLECTOMY     vein removal     Social History:  reports that she has never smoked. She has never used smokeless tobacco. She reports current alcohol use. She reports that she does not use drugs.  Allergies  Allergen Reactions   Accupril [Quinapril Hcl] Cough   Amlodipine Besylate Swelling    Leg swelling   Atacand [Candesartan] Other (See Comments)    Unknown reaction   Cozaar [Losartan Potassium] Other (See Comments)    Stomach upset    Maxzide [Hydrochlorothiazide W-Triamterene] Other (See Comments)    Unknown reaction   Tiazac [Diltiazem Hcl Er Beads] Other (See Comments)    Unknown reaction   Zestril [Lisinopril] Cough    Family History  Problem Relation Age of Onset   Heart disease Mother    Heart disease Father    Heart disease Brother  Pancreatic cancer Half-Brother        d. early 38s    Prior to Admission medications   Medication Sig Start Date End Date Taking? Authorizing Provider  aspirin EC 81 MG tablet Take 1 tablet (81 mg total) by mouth daily. Swallow whole. 10/27/21   Early Osmond, MD  atorvastatin (LIPITOR) 40 MG tablet Take 1 tablet (40 mg total) by mouth daily. 10/27/21   Early Osmond, MD  allopurinol (ZYLOPRIM) 100 MG tablet Take 1 tablet (100 mg total) by mouth daily. 08/24/20   Wallene Huh, DPM  B Complex-C (SUPER B COMPLEX PO) Take by mouth.    [provider]  Biotin 5 MG CAPS Take by mouth daily. 2 capsules once a day.    [provider]   carvedilol (COREG) 3.125 MG tablet Take 3.125 mg by mouth 2 (two) times daily with a meal.    [provider]  cholecalciferol (VITAMIN D3) 25 MCG (1000 UT) tablet Take 1,000 Units by mouth daily.    [provider]  colchicine 0.6 MG tablet TAKE 1 TABLET BY MOUTH EVERY DAY 09/04/21   Wallene Huh, DPM  furosemide (LASIX) 20 MG tablet Take 20 mg by mouth daily.    [provider]  hydrALAZINE (APRESOLINE) 25 MG tablet Take 25 mg by mouth 3 (three) times daily. 06/29/16   [provider]  levothyroxine (SYNTHROID) 100 MCG tablet Take 100 mcg by mouth daily. 08/08/21   [provider]  liothyronine (CYTOMEL) 5 MCG tablet Take 5 mcg by mouth daily. 09/22/21   [provider]  Omega-3 Fatty Acids (FISH OIL PO) Take 1 tablet by mouth daily.    [provider]  pyridOXINE (VITAMIN B-6) 100 MG tablet Take 100 mg by mouth daily.    [provider]  tamoxifen (NOLVADEX) 10 MG tablet Take 1 tablet (10 mg total) by mouth daily. 08/14/21   Nicholas Lose, MD  vitamin E 400 UNIT capsule Take 670 Units by mouth daily.    [provider]    Physical Exam: Vitals:   12/01/21 1145 12/01/21 1155 12/01/21 1217 12/01/21 1236  BP: 128/65 128/65 126/68 127/68  Pulse: 83 83 83 85  Resp: 14 15 (!) 22 16  Temp:  98.3 F (36.8 C) 98.3 F (36.8 C) 98.3 F (36.8 C)  TempSrc:  Oral Oral Oral  SpO2: 99% 98%  95%  Weight:      Height:       General:  Appears calm and comfortable and is in NAD, nasal packing in place Eyes:  PERRL, EOMI, normal lids, iris ENT:  grossly normal hearing, lips & tongue, mmm; appropriate dentition; L nasal rocket in place with scant amounts of bloody drainage leaking out periodically Neck:  no LAD, masses or thyromegaly Cardiovascular:  RRR, no m/r/g. No LE edema.  Respiratory:   CTA bilaterally with no wheezes/rales/rhonchi.  Normal respiratory effort. Abdomen:  soft, NT, ND Skin:  no rash or induration  seen on limited exam Musculoskeletal:  grossly normal tone BUE/BLE, good ROM, no bony abnormality Psychiatric:  grossly normal mood and affect, speech fluent and appropriate, AOx3 Neurologic:  CN 2-12 grossly intact, moves all extremities in coordinated fashion   Radiological Exams on Admission: Independently reviewed - see discussion in A/P where applicable  No results found.  EKG: Independently reviewed.  NSR with rate 74; LVH with no evidence of acute ischemia   Labs on Admission: I have personally reviewed the available labs  and imaging studies at the time of the admission.  Pertinent labs:    Na++ 126 K+ 3.1 CO2 15 Glucose 121 BUN 74/Creatinine 1.69/GFR 33 Albumin 2.5 BNP 63.2 WBC 15.4 Hgb 5.2    Assessment and Plan: * Acute posterior epistaxis- (present on admission) -Patient with recurrent epistaxis for days -She was seen in the ER with improvement but bleeding recurred -She had a similar episode also requiring overnight monitoring 1 year ago -Nasal packing in place and appears to have stabilized -Will monitor overnight -Large volume bloody sputum/emesis likely related to post-nasal drip -ENT to consult  Symptomatic anemia- (present on admission) -Patient has had substantial blood loss from epistaxis -She reports dark stools but this is likely digested blood  -Hgb 5.2 -Heme testing was ordered in the ER, but lower suspicion for actual GI blood loss; consider later evaluation if this is a concern -Transfuse 2 units PRBC to start and recheck Hgb afterwards. -Hold ASA   COVID-19 virus infection- (present on admission) -URI symptoms started 2 weeks ago and she "felt terrible" at that time -Negative COVID testing around that time -She did have recurrent symptoms on Monday of this week -Uncertain duration of current symptoms -Regardless, she is on room air without respiratory complaints and does not appear to need treatment at this time -Will need precautions,  however  Hypothyroidism- (present on admission) -She is taking Cytomel and Synthroid; while unusual, this does occur periodically and so will continue for now  Hypertension- (present on admission) -Continue Coreg and hydralazine  Hyperlipidemia- (present on admission) -She does not appear to be taking medications for this issue at this time  Malignant neoplasm of lower-outer quadrant of left breast of female, estrogen receptor positive ([place]) -Stable -Continue tamoxifen       Advance Care Planning:   Code Status: Full Code   Consults: ENT  DVT Prophylaxis: SCDs  Family Communication: Daughter was present throughout evaluation  Severity of Illness: The appropriate patient status for this patient is OBSERVATION. Observation status is judged to be reasonable and necessary in order to provide the required intensity of service to ensure the patient's safety. The patient's presenting symptoms, physical exam findings, and initial radiographic and laboratory data in the context of their medical condition is felt to place them at decreased risk for further clinical deterioration. Furthermore, it is anticipated that the patient will be medically stable for discharge from the hospital within 2 midnights of admission.   Author: Karmen Bongo, MD 12/01/2021 1:31 PM  For on call review [email or web address].

## 2021-12-01 NOTE — Consult Note (Signed)
Reason for Consult:epistaxis Referring Physician: er  Karen Mills is an 66 y.o. female.  HPI: hx of recurrent epistaxis from the left. She has had several packing attempts without successful cessation of the bleeding.  She then had a posterior pack placed recently and the bleeding has now stopped.  She did seem to have a significant amount of bleeding given the anemia and she currently is suffering.  She is to be admitted for observation.  She has not had this problem previously.  She is not on any blood thinners.  She denies any trauma.she is Covid positive  Past Medical History:  Diagnosis Date   Breast cancer ([place]) 2020   Left Breast Cancer   CKD (chronic kidney disease)    Gout    Hyperlipidemia    Hypertension    Hypothyroidism    Megaloblastic anemia    Obesity    Personal history of radiation therapy 2020   Left Breast Cancer   PONV (postoperative nausea and vomiting)    SCC (squamous cell carcinoma) Keratoacanthoma 10/10/2016   Right Shin (Cx3,5FU)   Squamous cell carcinoma in situ (SCCIS) 11/15/2016   Right Lower Shin (tx p bx)   Varicose veins     Past Surgical History:  Procedure Laterality Date   ABLATION ON ENDOMETRIOSIS     BREAST LUMPECTOMY Left 04/30/2019   BREAST LUMPECTOMY WITH RADIOACTIVE SEED AND SENTINEL LYMPH NODE BIOPSY Left 04/30/2019   Procedure: LEFT BREAST LUMPECTOMY WITH RADIOACTIVE SEED AND SENTINEL LYMPH NODE BIOPSY;  Surgeon: Jovita Kussmaul, MD;  Location: [facility];  Service: General;  Laterality: Left;   FOOT SURGERY     TONSILLECTOMY     vein removal      Family History  Problem Relation Age of Onset   Heart disease Mother    Heart disease Father    Heart disease Brother    Pancreatic cancer Half-Brother        d. early 65s    Social History:  reports that she has never smoked. She has never used smokeless tobacco. She reports current alcohol use. She reports that she does not use drugs.  Allergies:  Allergies   Allergen Reactions   Accupril [Quinapril Hcl] Cough   Amlodipine Besylate Swelling    Leg swelling   Atacand [Candesartan] Other (See Comments)    Unknown reaction   Cozaar [Losartan Potassium] Other (See Comments)    Stomach upset    Maxzide [Hydrochlorothiazide W-Triamterene] Other (See Comments)    Unknown reaction   Tiazac [Diltiazem Hcl Er Beads] Other (See Comments)    Unknown reaction   Zestril [Lisinopril] Cough    Medications: I have reviewed the patient's current medications.  Results for orders placed or performed during the hospital encounter of 12/01/21 (from the past 48 hour(s))  Type and screen [place]     Status: None (Preliminary result)   Collection Time: 12/01/21  7:40 AM  Result Value Ref Range   ABO/RH(D) O POS    Antibody Screen NEG    Sample Expiration 12/04/2021,2359    Unit Number [id number]    Blood Component Type RED CELLS,LR    Unit division 00    Status of Unit ALLOCATED    Transfusion Status OK TO TRANSFUSE    Crossmatch Result Compatible    Unit Number [id number]    Blood Component Type RED CELLS,LR    Unit division 00    Status of Unit ISSUED    Transfusion Status  OK TO TRANSFUSE    Crossmatch Result      Compatible Performed at [facility], [place] [address], [place], [place] [address]   ABO/Rh     Status: None   Collection Time: 12/01/21  7:50 AM  Result Value Ref Range   ABO/RH(D)      O POS Performed at [facility] [address], [place], [place] [address]   Comprehensive metabolic panel     Status: Abnormal   Collection Time: 12/01/21  7:52 AM  Result Value Ref Range   Sodium 126 (L) 135 - 145 mmol/L   Potassium 3.1 (L) 3.5 - 5.1 mmol/L   Chloride 99 98 - 111 mmol/L   CO2 15 (L) 22 - 32 mmol/L   Glucose, Bld 121 (H) 70 - 99 mg/dL    Comment: Glucose reference range applies only to samples taken after fasting for at least 8 hours.   BUN 74 (H) 8 - 23 mg/dL   Creatinine, Ser 1.69 (H)  0.44 - 1.00 mg/dL   Calcium 8.4 (L) 8.9 - 10.3 mg/dL   Total Protein 5.6 (L) 6.5 - 8.1 g/dL   Albumin 2.5 (L) 3.5 - 5.0 g/dL   AST 18 15 - 41 U/L   ALT 14 0 - 44 U/L   Alkaline Phosphatase 38 38 - 126 U/L   Total Bilirubin 0.2 (L) 0.3 - 1.2 mg/dL   GFR, Estimated 33 (L) >60 mL/min    Comment: (NOTE) Calculated using the CKD-EPI Creatinine Equation (2021)    Anion gap 12 5 - 15    Comment: Performed at [facility], [place] [address] [place], [place] [address]  CBC     Status: Abnormal   Collection Time: 12/01/21  7:52 AM  Result Value Ref Range   WBC 15.4 (H) 4.0 - 10.5 K/uL   RBC 1.50 (L) 3.87 - 5.11 MIL/uL   Hemoglobin 5.2 (LL) 12.0 - 15.0 g/dL    Comment: REPEATED TO VERIFY THIS CRITICAL RESULT HAS VERIFIED AND BEEN CALLED TO RN,ALINA BANKS BY NAZERA SHAMA ON 02 24 2023 AT 0807, AND HAS BEEN READ BACK.     HCT 15.7 (L) 36.0 - 46.0 %   MCV 104.7 (H) 80.0 - 100.0 fL   MCH 34.7 (H) 26.0 - 34.0 pg   MCHC 33.1 30.0 - 36.0 g/dL   RDW 13.7 11.5 - 15.5 %   Platelets 354 150 - 400 K/uL   nRBC 0.2 0.0 - 0.2 %    Comment: Performed at [facility] [address] [place], [place] [address]  Brain natriuretic peptide     Status: None   Collection Time: 12/01/21  7:52 AM  Result Value Ref Range   B Natriuretic Peptide 63.2 0.0 - 100.0 pg/mL    Comment: Performed at [facility] [address] [place], [place] [address]  Protime-INR     Status: None   Collection Time: 12/01/21  7:52 AM  Result Value Ref Range   Prothrombin Time 12.6 11.4 - 15.2 seconds   INR 0.9 0.8 - 1.2    Comment: (NOTE) INR goal varies based on device and disease states. Performed at [facility], [place] [address] [place],  [address]   Resp Panel by RT-PCR (Flu A&B, Covid) Nasopharyngeal Swab     Status: Abnormal   Collection Time: 12/01/21  8:43 AM   Specimen: Nasopharyngeal Swab; Nasopharyngeal(NP) swabs in vial transport medium  Result Value  Ref Range   SARS Coronavirus 2  by RT PCR POSITIVE (A) NEGATIVE    Comment: (NOTE) SARS-CoV-2 target nucleic acids are DETECTED.  The SARS-CoV-2 RNA is generally detectable in upper respiratory specimens during the acute phase of infection. Positive results are indicative of the presence of the identified virus, but do not rule out bacterial infection or co-infection with other pathogens not detected by the test. Clinical correlation with patient history and other diagnostic information is necessary to determine patient infection status. The expected result is Negative.  Fact Sheet for Patients: EntrepreneurPulse.com.au  Fact Sheet for Healthcare Providers: IncredibleEmployment.be  This test is not yet approved or cleared by the [place] FDA and  has been authorized for detection and/or diagnosis of SARS-CoV-2 by FDA under an Emergency Use Authorization (EUA).  This EUA will remain in effect (meaning this test can be used) for the duration of  the COVID-19 declaration under Section 564(b)(1) of the A ct, 21 U.S.C. section 360bbb-3(b)(1), unless the authorization is terminated or revoked sooner.     Influenza A by PCR NEGATIVE NEGATIVE   Influenza B by PCR NEGATIVE NEGATIVE    Comment: (NOTE) The Xpert Xpress SARS-CoV-2/FLU/RSV plus assay is intended as an aid in the diagnosis of influenza from Nasopharyngeal swab specimens and should not be used as a sole basis for treatment. Nasal washings and aspirates are unacceptable for Xpert Xpress SARS-CoV-2/FLU/RSV testing.  Fact Sheet for Patients: EntrepreneurPulse.com.au  Fact Sheet for Healthcare Providers: IncredibleEmployment.be  This test is not yet approved or cleared by the [place] FDA and has been authorized for detection and/or diagnosis of SARS-CoV-2 by FDA under an Emergency Use Authorization (EUA). This EUA will remain in effect (meaning this test can be used) for the  duration of the COVID-19 declaration under Section 564(b)(1) of the Act, 21 U.S.C. section 360bbb-3(b)(1), unless the authorization is terminated or revoked.  Performed at [facility], [place] [address], [place], [place] [address]   Prepare RBC (crossmatch)     Status: None   Collection Time: 12/01/21  8:54 AM  Result Value Ref Range   Order Confirmation      ORDER PROCESSED BY BLOOD BANK Performed at [facility], [place] [address], [place], [place] [address]     No results found.  ROS Blood pressure (!) 141/66, pulse 80, temperature 97.9 F (36.6 C), temperature source Oral, resp. rate 19, height 5\' 8"  (1.727 m), weight 89 kg, SpO2 98 %. Physical Exam HENT:     Head: Normocephalic.     Right Ear: Tympanic membrane normal.     Left Ear: Tympanic membrane normal.     Nose:     Comments: Pack is in place in the left nares with no evidence of bleeding    Mouth/Throat:     Mouth: Mucous membranes are moist.  Eyes:     Pupils: Pupils are equal, round, and reactive to light.  Musculoskeletal:     Cervical back: Normal range of motion.  Neurological:     Mental Status: She is alert.      Assessment/Plan: Epistaxis-she now has a posterior pack and is not currently having bleeding.  As long as she does not have further bleeding this pack should stay in for approximately 5 days.  She should be on a staph covering antibiotic.  She can follow-up as an outpatient to have the packing removed in the 5 to 6-day timeframe as long as she is not having further bleeding..[phone]  id number for follow up   Melissa Montane 12/01/2021, 11:06 AM

## 2021-12-01 NOTE — Progress Notes (Addendum)
Pharmacy Antibiotic Note  Karen Mills is a 66 y.o. female admitted on 12/01/2021 with  empiric for epitaxis .  Pharmacy has been consulted for Unasyn dosing.  Pt presented with epitaxis. ENT recommended some empiric staph aureus coverage. She has failed nasal packing. Planning for procedure with Dr. Kathee Delton.  Scr 1.69>>CrCl 46 ml/min  Plan: Unasyn 3g IV q6  Height: 5\' 8"  (172.7 cm) Weight: 89 kg (196 lb 3.4 oz) IBW/kg (Calculated) : 63.9  Temp (24hrs), Avg:98.1 F (36.7 C), Min:97.6 F (36.4 C), Max:98.5 F (36.9 C)  Recent Labs  Lab 12/01/21 0752  WBC 15.4*  CREATININE 1.69*    Estimated Creatinine Clearance: 38.2 mL/min (A) (by C-G formula based on SCr of 1.69 mg/dL (H)).    Allergies  Allergen Reactions   Accupril [Quinapril Hcl] Cough   Amlodipine Besylate Swelling    Leg swelling   Atacand [Candesartan] Other (See Comments)    Unknown reaction   Cozaar [Losartan Potassium] Other (See Comments)    Stomach upset    Maxzide [Hydrochlorothiazide W-Triamterene] Other (See Comments)    Unknown reaction   Tiazac [Diltiazem Hcl Er Beads] Other (See Comments)    Unknown reaction   Zestril [Lisinopril] Cough    Antimicrobials this admission: 2/24 unasyn>>  Dose adjustments this admission:   Microbiology results: 2/24 COVID+   Onnie Boer, PharmD, BCIDP, AAHIVP, CPP Infectious Disease Pharmacist 12/01/2021 5:04 PM

## 2021-12-01 NOTE — ED Provider Notes (Signed)
[facility] EMERGENCY DEPARTMENT  Provider Note  CSN: [id number] Arrival date & time: 12/01/21 1245  History Chief Complaint  Patient presents with   Epistaxis    Karen Mills is a 66 y.o. female who presents today with epistaxis.  Patient has had nosebleeds for the last few days.  All started after frequent sneezing with upper respiratory infection.  She was seen at an outside hospital 2 days ago.  They were able to stop the bleeding with lidocaine and Afrin.  However, last night she continued to have intermittent bleeding.  She has been feeling lightheaded.  She has had vomiting this morning with blood clots.  She also had 1 episode of dark stools. No hx of bleeding disorder. No blood thinners. No liver disorders   Home Medications Prior to Admission medications   Medication Sig Start Date End Date Taking? Authorizing Provider  aspirin EC 81 MG tablet Take 1 tablet (81 mg total) by mouth daily. Swallow whole. 10/27/21   Early Osmond, MD  atorvastatin (LIPITOR) 40 MG tablet Take 1 tablet (40 mg total) by mouth daily. 10/27/21   Early Osmond, MD  allopurinol (ZYLOPRIM) 100 MG tablet Take 1 tablet (100 mg total) by mouth daily. 08/24/20   Wallene Huh, DPM  B Complex-C (SUPER B COMPLEX PO) Take by mouth.    [provider]  Biotin 5 MG CAPS Take by mouth daily. 2 capsules once a day.    [provider]  carvedilol (COREG) 3.125 MG tablet Take 3.125 mg by mouth 2 (two) times daily with a meal.    [provider]  cholecalciferol (VITAMIN D3) 25 MCG (1000 UT) tablet Take 1,000 Units by mouth daily.    [provider]  colchicine 0.6 MG tablet TAKE 1 TABLET BY MOUTH EVERY DAY 09/04/21   Wallene Huh, DPM  furosemide (LASIX) 20 MG tablet Take 20 mg by mouth daily.    [provider]  hydrALAZINE (APRESOLINE) 25 MG tablet Take 25 mg by mouth 3 (three) times daily. 06/29/16   [provider]  levothyroxine  (SYNTHROID) 100 MCG tablet Take 100 mcg by mouth daily. 08/08/21   [provider]  liothyronine (CYTOMEL) 5 MCG tablet Take 5 mcg by mouth daily. 09/22/21   [provider]  Omega-3 Fatty Acids (FISH OIL PO) Take 1 tablet by mouth daily.    [provider]  pyridOXINE (VITAMIN B-6) 100 MG tablet Take 100 mg by mouth daily.    [provider]  tamoxifen (NOLVADEX) 10 MG tablet Take 1 tablet (10 mg total) by mouth daily. 08/14/21   Nicholas Lose, MD  vitamin E 400 UNIT capsule Take 670 Units by mouth daily.    [provider]     Allergies    Accupril [quinapril hcl], Amlodipine besylate, Atacand [candesartan], Cozaar [losartan potassium], Maxzide [hydrochlorothiazide w-triamterene], Tiazac [diltiazem hcl er beads], and Zestril [lisinopril]   Review of Systems   Review of Systems  Constitutional:  Negative for chills and fever.  HENT:  Positive for nosebleeds. Negative for sore throat.   Eyes:  Negative for pain and visual disturbance.  Respiratory:  Negative for cough and shortness of breath.   Cardiovascular:  Negative for chest pain and palpitations.  Gastrointestinal:  Negative for abdominal pain and vomiting.  Genitourinary:  Negative for dysuria and hematuria.  Musculoskeletal:  Negative for arthralgias and back pain.  Skin:  Negative for color change and rash.  Neurological:  Negative for seizures and syncope.  All other systems reviewed and are negative. Please see HPI for pertinent positives and negatives  Physical Exam BP (!) 155/67    Pulse 79    Temp 97.9 F (36.6 C) (Oral)    Resp 17    Ht 5\' 8"  (1.727 m)    Wt 89 kg    SpO2 100%    BMI 29.83 kg/m   Physical Exam Vitals and nursing note reviewed.  Constitutional:      General: She is not in acute distress.    Appearance: She is well-developed.  HENT:     Head: Normocephalic and atraumatic.     Nose:     Comments: Patient with left-sided epistaxis.  No anterior bleeding  identified in bilateral nares.  She does have blood in her posterior oropharynx. Eyes:     Conjunctiva/sclera: Conjunctivae normal.  Cardiovascular:     Rate and Rhythm: Normal rate and regular rhythm.     Heart sounds: No murmur heard. Pulmonary:     Effort: Pulmonary effort is normal. No respiratory distress.     Breath sounds: Normal breath sounds.  Abdominal:     Palpations: Abdomen is soft.     Tenderness: There is no abdominal tenderness.  Musculoskeletal:        General: No swelling.     Cervical back: Neck supple.  Skin:    General: Skin is warm and dry.     Capillary Refill: Capillary refill takes less than 2 seconds.  Neurological:     Mental Status: She is alert.  Psychiatric:        Mood and Affect: Mood normal.    ED Results / Procedures / Treatments   EKG EKG Interpretation  Date/Time:  Friday December 01 2021 07:44:29 EST Ventricular Rate:  74 PR Interval:  159 QRS Duration: 111 QT Interval:  433 QTC Calculation: 481 R Axis:   14 Text Interpretation: Sinus rhythm Probable left ventricular hypertrophy Baseline wander similar to prior tracing no stemi Confirmed by Wynona Dove ([id number]) on 12/01/2021 8:02:12 AM  Procedures .Epistaxis Management  Date/Time: 12/01/2021 8:37 AM Performed by: Jacelyn Pi, MD Authorized by: Jeanell Sparrow, DO   Consent:    Consent obtained:  Verbal   Consent given by:  Patient   Risks, benefits, and alternatives were discussed: yes     Risks discussed:  Bleeding, infection, nasal injury and pain   Alternatives discussed:  No treatment Universal protocol:    Patient identity confirmed:  Verbally with patient Anesthesia:    Anesthesia method:  None Procedure details:    Treatment site:  L anterior   Treatment method:  Anterior pack   Treatment complexity:  Limited   Treatment episode: initial   Post-procedure details:    Assessment:  Bleeding decreased   Procedure completion:  Tolerated  Medications Ordered in the  ED Medications  sodium chloride 0.9 % bolus 1,000 mL (0 mLs Intravenous Stopped 12/01/21 0943)  lidocaine (PF) (XYLOCAINE) 1 % injection 10 mL (10 mLs Other Given by Other 12/01/21 0929)  ondansetron (ZOFRAN) injection 4 mg (4 mg Intravenous Given 12/01/21 6606)     ED Course       MDM   This patient presents to the ED for concern of epistaxis, this involves an extensive number of treatment options, and is a complaint that carries with it a high risk of complications and morbidity.  The differential diagnosis includes internal pain, posterior nosebleed, blood loss anemia.  Additional history obtained: Additional history obtained from family Records reviewed Care Everywhere/External Records and Primary Care Documents  Lab Tests: I Ordered, and personally interpreted labs.  The pertinent results include: Hemoglobin was 5.3.  Mild hyponatremia.  Mild hypokalemia.  Medical Decision Making: Patient here with nosebleeding.  Has been present for multiple days.  Hemodynamically stable on arrival.  She does continue to have blood in her posterior oropharynx.  No appreciable anterior bleeding.  Attempted anterior packing with minimal success.  Posterior nasal packing was placed.  Bleeding stopped.  Patient with hemoglobin of 5.3.  She was transfused with 2 units of blood.  Patient will be admitted to the hospital.  Spoke to Dr. Janace Hoard from ENT, they will see patient in the hospital.  Packing to be removed in 5 days.  Complexity of problems addressed: Patients presentation is most consistent with  acute presentation with potential threat to life or bodily function  Disposition: After consideration of the diagnostic results and the patients response to treatment,  I feel that the patent would benefit from admission to the hospital .   Patient seen in conjunction with my attending, Dr. Pearline Cables.    Final Clinical Impression(s) / ED Diagnoses Final diagnoses:  ABLA (acute blood loss anemia)   Posterior epistaxis    Rx / DC Orders ED Discharge Orders     None         Jacelyn Pi, MD 12/01/21 [place], Samuel A, DO 12/01/21 1039

## 2021-12-01 NOTE — ED Triage Notes (Addendum)
Pt here POV d/t nose bleed since Tuesday. Pt seen at [facility] and given afrin. Worked for short time. Pt arrived with small nose bleed. While in triage pt confused at one point, began vomiting large amounts of blood with clots. Pt diaphoretic and hypotensive in the 90's in triage. Pt reports dark stool X1 day.

## 2021-12-01 NOTE — Assessment & Plan Note (Signed)
-  URI symptoms started 2 weeks ago and she "felt terrible" at that time -Negative COVID testing around that time -She did have recurrent symptoms on Monday of this week -Uncertain duration of current symptoms -Regardless, she is on room air without respiratory complaints and does not appear to need treatment at this time -Will need precautions, however

## 2021-12-01 NOTE — Anesthesia Preprocedure Evaluation (Addendum)
Anesthesia Evaluation  Patient identified by MRN, date of birth, ID band Patient awake    Reviewed: Allergy & Precautions, NPO status , Patient's Chart, lab work & pertinent test results, reviewed documented beta blocker date and time   History of Anesthesia Complications (+) PONV and history of anesthetic complications  Airway Mallampati: II  TM Distance: >3 FB Neck ROM: Full    Dental  (+) Dental Advisory Given, Teeth Intact   Pulmonary neg pulmonary ROS,  COVID+   Pulmonary exam normal        Cardiovascular hypertension, Pt. on medications and Pt. on home beta blockers Normal cardiovascular exam     Neuro/Psych negative neurological ROS  negative psych ROS   GI/Hepatic negative GI ROS, Neg liver ROS,   Endo/Other  Hypothyroidism   Renal/GU CRFRenal disease  negative genitourinary   Musculoskeletal Breast cancer   Abdominal   Peds  Hematology  (+) Blood dyscrasia, anemia ,  Nose bleed despite recurrent packing Hb 8 after transfusion x 2    Anesthesia Other Findings   Reproductive/Obstetrics                           Anesthesia Physical Anesthesia Plan  ASA: 2  Anesthesia Plan: General   Post-op Pain Management:    Induction: Intravenous and Rapid sequence  PONV Risk Score and Plan: 4 or greater and Ondansetron, Dexamethasone, Treatment may vary due to age or medical condition and Propofol infusion  Airway Management Planned: Oral ETT  Additional Equipment: None  Intra-op Plan:   Post-operative Plan: Possible Post-op intubation/ventilation  Informed Consent: I have reviewed the patients History and Physical, chart, labs and discussed the procedure including the risks, benefits and alternatives for the proposed anesthesia with the patient or authorized representative who has indicated his/her understanding and acceptance.     Dental advisory given  Plan Discussed with:  CRNA and Anesthesiologist  Anesthesia Plan Comments:        Anesthesia Quick Evaluation

## 2021-12-01 NOTE — Sedation Documentation (Signed)
ACT 197 notified Dr Estanislado Pandy

## 2021-12-01 NOTE — Consult Note (Addendum)
NAME:  Karen Mills, MRN:  [id number], DOB:  Feb 20, 1956, LOS: 0 ADMISSION DATE:  12/01/2021, CONSULTATION DATE:  12/01/21 REFERRING MD:  Lorin Mercy - TRH , CHIEF COMPLAINT:  Critical Epistaxis   History of Present Illness:   66 yo F PMH Breast cancer, HTN, Hypothyroidism who presented to ED 2/24 with epistaxis. Began 2/21, was blowing chunks of blood out of nose. She was Rx prednisone by urgent care for URI sx. Later presented to [facility] 2.23 and received afrin, lido with some improvement. Bleeding has recurred and persisted, prompting ED presentation. In ED, noted to have hgb 5.3. Transfused 2 PRBC. Failed anterior packing and was posteriorly packed. Found to be COVID-19 positive.  Admitted to [facility]. ENT consulted, at time of eval, bleeding had ceased.   2/24 afternoon, bleeding recurred. TRH discussed with ENT who rec IR eval for embo. IR was indicated that pt will require ICU care after procedure. PCCM consulted in this setting.   Does not take blood thinners. Associated black stools.    Pertinent  Medical History   Breast cancer  Htn HLD Hypothyroidism  PONV Significant Hospital Events: Including procedures, antibiotic start and stop dates in addition to other pertinent events   2/24 multiple nasal packings -- anterior and posterior-- for significant epistaxis with associated ABLA requiring 2 PRBC and hospitalist admission. Rebled in afternoon, IR planning for embo, and advises ICU transfer after procedure   Interim History / Subjective:  This afternoon started bleeding from L nare again after coughing. Pt and daughter repositioned packing and bleeding has slowed.   IR on phone with daughter and pt RE consent for embo   Daughter at bedside   Objective   Blood pressure (!) 145/70, pulse 73, temperature 98.5 F (36.9 C), temperature source Oral, resp. rate 17, height 5\' 8"  (1.727 m), weight 89 kg, SpO2 98 %.        Intake/Output Summary (Last 24 hours) at 12/01/2021 1639 Last data  filed at 12/01/2021 1334 Gross per 24 hour  Intake 1000 ml  Output 2200 ml  Net -1200 ml   Filed Weights   12/01/21 0745  Weight: 89 kg    Examination: General: Intubated sedated HENT: [place]. L nasal packing, anteriorly saturated with blood.  Lungs: Even unlabored on RA Cardiovascular: rrr cap refill brisk  Abdomen: soft ndnt  Extremities: no acute joint deformity no cyanosis or clubbing  Neuro: Intubated sedated GU: distended bladder   Resolved Hospital Problem list     Assessment & Plan:   Recurrent epistaxis, right sided, with associated ABLA -has failed multiple packing attempts (anterior and posterior) -required 2 PRBC for hgb 5.3 -ENT eval'd earlier in the day when bleeding had ceased and recommended packing in place x 5 days. With recurrence later today, ENT rec IR consult P -NPO for procedure  -Unasyn  -plan for cerebral angiogram with embo (bilateral). I was present at the bedside when consent was obtained over the phone by [facility] team.  -post procedure, will plan for ICU transfer -SBP goal post procedure 120-140  -packing to remain in place  -follow H/H, transfuse as needed  COVID-19 infection -minimal URI sx, sounds like this may have started 2 weeks ago.  P -continues on precautions   HTN- plan for SBP 120-140 post procedure  Hx breast cancer - tamoxifen   Hypothyroidism - cytomel and synthroid   Melena - suspect this is actually digested blood from epistaxis. Continue to monitor   Hypokalemia - received 110mEq Kcl-- AM BMP  Hyponatremia, asymptomatic - AM BMP  PONV (having some anxiety about this)- looks like this has already been noted by anesthesia on their pre-procedure eval, but encouraged pt to discuss with anesthesia team when they meet   Best Practice (right click and "Reselect all SmartList Selections" daily)   Diet/type: NPO DVT prophylaxis: SCD GI prophylaxis: N/A Lines: N/A Foley:  N/A Code Status:  full code Last date of  multidisciplinary goals of care discussion [12/01/21 - pt and daughter, CCM + IR ]  Labs   CBC: Recent Labs  Lab 12/01/21 0752  WBC 15.4*  HGB 5.2*  HCT 15.7*  MCV 104.7*  PLT 867    Basic Metabolic Panel: Recent Labs  Lab 12/01/21 0752  NA 126*  K 3.1*  CL 99  CO2 15*  GLUCOSE 121*  BUN 74*  CREATININE 1.69*  CALCIUM 8.4*   GFR: Estimated Creatinine Clearance: 38.2 mL/min (A) (by C-G formula based on SCr of 1.69 mg/dL (H)). Recent Labs  Lab 12/01/21 0752  WBC 15.4*    Liver Function Tests: Recent Labs  Lab 12/01/21 0752  AST 18  ALT 14  ALKPHOS 38  BILITOT 0.2*  PROT 5.6*  ALBUMIN 2.5*   No results for input(s): LIPASE, AMYLASE in the last 168 hours. No results for input(s): AMMONIA in the last 168 hours.  ABG No results found for: PHART, PCO2ART, PO2ART, HCO3, TCO2, ACIDBASEDEF, O2SAT   Coagulation Profile: Recent Labs  Lab 12/01/21 0752  INR 0.9    Cardiac Enzymes: No results for input(s): CKTOTAL, CKMB, CKMBINDEX, TROPONINI in the last 168 hours.  HbA1C: No results found for: HGBA1C  CBG: No results for input(s): GLUCAP in the last 168 hours.  Review of Systems:   Review of Systems  Constitutional: Negative.   HENT:  Positive for congestion and nosebleeds.   Eyes: Negative.   Respiratory:  Positive for cough.   Cardiovascular: Negative.   Gastrointestinal:  Positive for melena.  Genitourinary: Negative.   Musculoskeletal: Negative.   Skin: Negative.   Neurological: Negative.   Endo/Heme/Allergies: Negative.   Psychiatric/Behavioral: Negative.      Past Medical History:  She,  has a past medical history of Breast cancer ([place]) (2020), CKD (chronic kidney disease), Gout, Hyperlipidemia, Hypertension, Hypothyroidism, Megaloblastic anemia, Obesity, Personal history of radiation therapy (2020), PONV (postoperative nausea and vomiting), SCC (squamous cell carcinoma) Keratoacanthoma (10/10/2016), Squamous cell carcinoma in situ (SCCIS)  (11/15/2016), and Varicose veins.   Surgical History:   Past Surgical History:  Procedure Laterality Date   ABLATION ON ENDOMETRIOSIS     BREAST LUMPECTOMY Left 04/30/2019   BREAST LUMPECTOMY WITH RADIOACTIVE SEED AND SENTINEL LYMPH NODE BIOPSY Left 04/30/2019   Procedure: LEFT BREAST LUMPECTOMY WITH RADIOACTIVE SEED AND SENTINEL LYMPH NODE BIOPSY;  Surgeon: Jovita Kussmaul, MD;  Location: [facility];  Service: General;  Laterality: Left;   FOOT SURGERY     TONSILLECTOMY     vein removal       Social History:   reports that she has never smoked. She has never used smokeless tobacco. She reports current alcohol use. She reports that she does not use drugs.   Family History:  Her family history includes Heart disease in her brother, father, and mother; Pancreatic cancer in her half-brother.   Allergies Allergies  Allergen Reactions   Accupril [Quinapril Hcl] Cough   Amlodipine Besylate Swelling    Leg swelling   Atacand [Candesartan] Other (See Comments)    Unknown reaction   Cozaar [  Losartan Potassium] Other (See Comments)    Stomach upset    Maxzide [Hydrochlorothiazide W-Triamterene] Other (See Comments)    Unknown reaction   Tiazac [Diltiazem Hcl Er Beads] Other (See Comments)    Unknown reaction   Zestril [Lisinopril] Cough     Home Medications  Prior to Admission medications   Medication Sig Start Date End Date Taking? Authorizing Provider  acetaminophen (TYLENOL) 650 MG CR tablet Take 1,300 mg by mouth 2 (two) times daily as needed for pain.   Yes [provider]  allopurinol (ZYLOPRIM) 300 MG tablet Take 300 mg by mouth every morning. 10/24/21  Yes [provider]  aspirin EC 81 MG tablet Take 1 tablet (81 mg total) by mouth daily. Swallow whole. Patient taking differently: Take 81 mg by mouth every morning. Swallow whole. 10/27/21  Yes Early Osmond, MD  B Complex-C (SUPER B COMPLEX PO) Take 1 tablet by mouth every morning.   Yes  [provider]  Biotin 10000 MCG TABS Take 10,000 mcg by mouth every morning.   Yes [provider]  carvedilol (COREG) 6.25 MG tablet Take 6.25 mg by mouth 2 (two) times daily. 10/24/21  Yes [provider]  cholecalciferol (VITAMIN D3) 25 MCG (1000 UT) tablet Take 1,000 Units by mouth every morning.   Yes [provider]  colchicine 0.6 MG tablet TAKE 1 TABLET BY MOUTH EVERY DAY Patient taking differently: Take 0.6 mg by mouth daily as needed (gout attacks). 09/04/21  Yes [place], Tamala Fothergill, DPM  furosemide (LASIX) 20 MG tablet Take 20 mg by mouth 2 (two) times daily with breakfast and lunch.   Yes [provider]  hydrALAZINE (APRESOLINE) 100 MG tablet Take 100 mg by mouth 3 (three) times daily.   Yes [provider]  levothyroxine (SYNTHROID) 100 MCG tablet Take 100 mcg by mouth daily before breakfast. 08/08/21  Yes [provider]  liothyronine (CYTOMEL) 5 MCG tablet Take 5 mcg by mouth daily before breakfast. 09/22/21  Yes [provider]  Omega-3 Fatty Acids (FISH OIL PO) Take 800 mg by mouth every morning.   Yes [provider]  predniSONE (DELTASONE) 20 MG tablet Take 20 mg by mouth 2 (two) times daily. 11/28/21  Yes [provider]  pyridOXINE (VITAMIN B-6) 100 MG tablet Take 100 mg by mouth in the morning.   Yes [provider]  tamoxifen (NOLVADEX) 10 MG tablet Take 1 tablet (10 mg total) by mouth daily. Patient taking differently: Take 10 mg by mouth every morning. 08/14/21  Yes Nicholas Lose, MD  Vitamin E 670 MG (1000 UT) CAPS Take 1,000 Units by mouth every morning.   Yes [provider]     Critical care time: n/a      Eliseo Gum MSN, AGACNP-BC [place] for pager 12/01/2021, 5:52 PM

## 2021-12-01 NOTE — Assessment & Plan Note (Signed)
-  Continue Coreg and hydralazine

## 2021-12-01 NOTE — Assessment & Plan Note (Signed)
-  Stable -Continue tamoxifen

## 2021-12-01 NOTE — Progress Notes (Addendum)
eLink Physician-Brief Progress Note Patient Name: Karen Mills DOB: [birth year] MRN: [id number]   Date of Service  12/01/2021  HPI/Events of Note  65/F with breast cancer, hypothyroidism, hypertension, presenting due to epistaxis. She was profoundly anemic with hgb 5.3.  She was transfused 2 units pRBCs.  The patient had failed anterior packing and was posteriorly packed but she had bleeding again.  IR consulted.  She is now s.p Bilateral common carotid and bilateral external, carotid arteriograms followed by superselective embolization of bilateral IMA branches and Lt facial artery nasal branches with 250 to 300 and 350 to 500 PVA particles to stasis through a right common femoral artery approach.    Pt also noted to be covid positive.    eICU Interventions  A>  Acute blood loss anemia Epistaxis s/p IR embolization Acute respiratory failure Breast CA Hypokalemia Hyponatremia CKD  P> Continue Unasyn.  SBP goal 120-140 post procedure. Start on cleviprex. Continue tamoxifen, synthroid, cytomel.  Get CXR and ABG.  Replete K. Start on precedex gtt and add fentanyl prn.  SCDs for DVT prophylaxis.  Famotidine for GI prophylaxis.     Intervention Category Evaluation Type: New Patient Evaluation  Elsie Lincoln 12/01/2021, 10:08 PM  6:06 AM Notified of K at 3.5, crea stable at 1.66.   Plan> Replete K - KCL IV 56meq x 3 ordered.

## 2021-12-01 NOTE — Assessment & Plan Note (Addendum)
-  Patient has had substantial blood loss from epistaxis -She reports dark stools but this is likely digested blood  -Hgb 5.2 -Heme testing was ordered in the ER, but lower suspicion for actual GI blood loss; consider later evaluation if this is a concern -Transfuse 2 units PRBC to start and recheck Hgb afterwards. -Hold ASA

## 2021-12-01 NOTE — Procedures (Signed)
INR. Bilateral common carotid andbilateral external, carotid arteriograms followed by superselective embolization of bilateral IMA branches and Lt facial artery nasal branches with 250 to 300 and 350 to 500 PVA particles to stasis. RT CFA approach . Post distal pulses all dopplerable. S.Abdulaziz Toman MD

## 2021-12-01 NOTE — Anesthesia Postprocedure Evaluation (Signed)
Anesthesia Post Note  Patient: Karen Mills  Procedure(s) Performed: RADIOLOGY WITH ANESTHESIA     Patient location during evaluation: ICU Anesthesia Type: General Level of consciousness: sedated and patient remains intubated per anesthesia plan Pain management: pain level controlled Vital Signs Assessment: post-procedure vital signs reviewed and stable Respiratory status: patient remains intubated per anesthesia plan Cardiovascular status: stable Postop Assessment: no apparent nausea or vomiting Anesthetic complications: no   No notable events documented.  Last Vitals:  Vitals:   12/01/21 1723 12/01/21 2148  BP: 137/63   Pulse: 80   Resp: 18   Temp: 37.2 C   SpO2: 99% 100%    Last Pain:  Vitals:   12/01/21 1723  TempSrc: Oral  PainSc:                  Audry Pili

## 2021-12-01 NOTE — Assessment & Plan Note (Signed)
-  Patient with recurrent epistaxis for days -She was seen in the ER with improvement but bleeding recurred -She had a similar episode also requiring overnight monitoring 1 year ago -Nasal packing in place and appears to have stabilized -Will monitor overnight -Large volume bloody sputum/emesis likely related to post-nasal drip -ENT to consult

## 2021-12-01 NOTE — Transfer of Care (Signed)
Immediate Anesthesia Transfer of Care Note  Patient: Karen Mills  Procedure(s) Performed: RADIOLOGY WITH ANESTHESIA  Patient Location: ICU  Anesthesia Type:General  Level of Consciousness: Patient remains intubated per anesthesia plan  Airway & Oxygen Therapy: Patient remains intubated per anesthesia plan and Patient placed on Ventilator (see vital sign flow sheet for setting)  Post-op Assessment: Report given to RN and Post -op Vital signs reviewed and stable  Post vital signs: Reviewed and stable  Last Vitals:  Vitals Value Taken Time  BP 182/94 12/01/21 2144  Temp    Pulse 76 12/01/21 2156  Resp 18 12/01/21 2156  SpO2 100 % 12/01/21 2156  Vitals shown include unvalidated device data.  Last Pain:  Vitals:   12/01/21 1723  TempSrc: Oral  PainSc:       Patients Stated Pain Goal: 0 (49/75/30 0511)  Complications: No notable events documented.

## 2021-12-01 NOTE — Plan of Care (Signed)

## 2021-12-01 NOTE — Sedation Documentation (Signed)
Spoke with bed control patient assigned to room 4N30.

## 2021-12-02 DIAGNOSIS — U071 COVID-19: Secondary | ICD-10-CM

## 2021-12-02 DIAGNOSIS — Z978 Presence of other specified devices: Secondary | ICD-10-CM

## 2021-12-02 DIAGNOSIS — D62 Acute posthemorrhagic anemia: Secondary | ICD-10-CM | POA: Diagnosis not present

## 2021-12-02 DIAGNOSIS — R040 Epistaxis: Principal | ICD-10-CM

## 2021-12-02 LAB — POCT I-STAT 7, (LYTES, BLD GAS, ICA,H+H)
Acid-base deficit: 7 mmol/L — ABNORMAL HIGH (ref 0.0–2.0)
Bicarbonate: 17.2 mmol/L — ABNORMAL LOW (ref 20.0–28.0)
Calcium, Ion: 1.15 mmol/L (ref 1.15–1.40)
HCT: 29 % — ABNORMAL LOW (ref 36.0–46.0)
Hemoglobin: 9.9 g/dL — ABNORMAL LOW (ref 12.0–15.0)
O2 Saturation: 97 %
Patient temperature: 98.6
Potassium: 3.4 mmol/L — ABNORMAL LOW (ref 3.5–5.1)
Sodium: 137 mmol/L (ref 135–145)
TCO2: 18 mmol/L — ABNORMAL LOW (ref 22–32)
pCO2 arterial: 28.5 mmHg — ABNORMAL LOW (ref 32–48)
pH, Arterial: 7.388 (ref 7.35–7.45)
pO2, Arterial: 92 mmHg (ref 83–108)

## 2021-12-02 LAB — BASIC METABOLIC PANEL
Anion gap: 10 (ref 5–15)
BUN: 56 mg/dL — ABNORMAL HIGH (ref 8–23)
CO2: 17 mmol/L — ABNORMAL LOW (ref 22–32)
Calcium: 7.5 mg/dL — ABNORMAL LOW (ref 8.9–10.3)
Chloride: 110 mmol/L (ref 98–111)
Creatinine, Ser: 1.66 mg/dL — ABNORMAL HIGH (ref 0.44–1.00)
GFR, Estimated: 34 mL/min — ABNORMAL LOW (ref >60)
Glucose, Bld: 146 mg/dL — ABNORMAL HIGH (ref 70–99)
Potassium: 3.5 mmol/L (ref 3.5–5.1)
Sodium: 137 mmol/L (ref 135–145)

## 2021-12-02 LAB — CBC WITH DIFFERENTIAL/PLATELET
Abs Immature Granulocytes: 0.23 10*3/uL — ABNORMAL HIGH (ref 0.00–0.07)
Basophils Absolute: 0 10*3/uL (ref 0.0–0.1)
Basophils Relative: 0 %
Eosinophils Absolute: 0 10*3/uL (ref 0.0–0.5)
Eosinophils Relative: 0 %
HCT: 23.9 % — ABNORMAL LOW (ref 36.0–46.0)
Hemoglobin: 8.2 g/dL — ABNORMAL LOW (ref 12.0–15.0)
Immature Granulocytes: 2 %
Lymphocytes Relative: 10 %
Lymphs Abs: 1.2 10*3/uL (ref 0.7–4.0)
MCH: 32.3 pg (ref 26.0–34.0)
MCHC: 34.3 g/dL (ref 30.0–36.0)
MCV: 94.1 fL (ref 80.0–100.0)
Monocytes Absolute: 0.3 10*3/uL (ref 0.1–1.0)
Monocytes Relative: 3 %
Neutro Abs: 10.3 10*3/uL — ABNORMAL HIGH (ref 1.7–7.7)
Neutrophils Relative %: 85 %
Platelets: 271 10*3/uL (ref 150–400)
RBC: 2.54 MIL/uL — ABNORMAL LOW (ref 3.87–5.11)
RDW: 15.8 % — ABNORMAL HIGH (ref 11.5–15.5)
WBC: 12.1 10*3/uL — ABNORMAL HIGH (ref 4.0–10.5)
nRBC: 0 % (ref 0.0–0.2)

## 2021-12-02 LAB — HIV ANTIBODY (ROUTINE TESTING W REFLEX): HIV Screen 4th Generation wRfx: NONREACTIVE

## 2021-12-02 LAB — MRSA NEXT GEN BY PCR, NASAL: MRSA by PCR Next Gen: NOT DETECTED

## 2021-12-02 MED ORDER — ORAL CARE MOUTH RINSE: 15.0000 mL | OROMUCOSAL | Status: DC

## 2021-12-02 MED ORDER — FUROSEMIDE 20 MG PO TABS
20.0000 mg | ORAL_TABLET | Freq: Two times a day (BID) | ORAL | Status: DC
Start: 2021-12-02 — End: 2021-12-04
  Administered 2021-12-02 – 2021-12-04 (×5): 20 mg via ORAL
  Filled 2021-12-02 (×5): qty 1

## 2021-12-02 MED ORDER — DOCUSATE SODIUM 100 MG PO CAPS
100.0000 mg | ORAL_CAPSULE | Freq: Two times a day (BID) | ORAL | Status: DC
  Administered 2021-12-02 – 2021-12-04 (×4): 100 mg via ORAL
  Filled 2021-12-02 (×4): qty 1

## 2021-12-02 MED ORDER — MELATONIN 3 MG PO TABS
3.0000 mg | ORAL_TABLET | Freq: Every day | ORAL | Status: DC
Start: 2021-12-02 — End: 2021-12-04
  Administered 2021-12-02 – 2021-12-03 (×2): 3 mg via ORAL
  Filled 2021-12-02 (×2): qty 1

## 2021-12-02 MED ORDER — POTASSIUM CHLORIDE 10 MEQ/100ML IV SOLN: 10.0000 meq | INTRAVENOUS | Status: DC

## 2021-12-02 MED ORDER — CHLORHEXIDINE GLUCONATE 0.12% ORAL RINSE (MEDLINE KIT)
15.0000 mL | Freq: Two times a day (BID) | OROMUCOSAL | Status: DC
  Administered 2021-12-02: 15 mL via OROMUCOSAL

## 2021-12-02 NOTE — Consult Note (Signed)
NAME:  Karen Mills, MRN:  [id number], DOB:  [birth year], LOS: 1 ADMISSION DATE:  12/01/2021, CONSULTATION DATE:  12/01/21 REFERRING MD:  Lorin Mercy - TRH , CHIEF COMPLAINT:  Critical Epistaxis   History of Present Illness:   66 yo F PMH Breast cancer, HTN, Hypothyroidism who presented to ED 2/24 with epistaxis. Began 2/21, was blowing chunks of blood out of nose. She was Rx prednisone by urgent care for URI sx. Later presented to [facility] 2.23 and received afrin, lido with some improvement. Bleeding has recurred and persisted, prompting ED presentation. In ED, noted to have hgb 5.3. Transfused 2 PRBC. Failed anterior packing and was posteriorly packed. Found to be COVID-19 positive.  Admitted to [facility]. ENT consulted, at time of eval, bleeding had ceased.   2/24 afternoon, bleeding recurred. TRH discussed with ENT who rec IR eval for embo. IR was indicated that pt will require ICU care after procedure. PCCM consulted in this setting.   Does not take blood thinners. Associated black stools.    Pertinent  Medical History   Breast cancer  Htn HLD Hypothyroidism  PONV Significant Hospital Events: Including procedures, antibiotic start and stop dates in addition to other pertinent events   2/24 multiple nasal packings -- anterior and posterior-- for significant epistaxis with associated ABLA requiring 2 PRBC and hospitalist admission. Rebled in afternoon, IR planning for embo, and advises ICU transfer after procedure  2/25 Bilateral common carotid andbilateral external, carotid arteriograms followed by superselective embolization of bilateral IMA branches and Lt facial artery nasal branches with 250 to 300 and 350 to 500 PVA particles to stasis. RT CFA approach .  Interim History / Subjective:   No further bleeding. Patient awake and following commands.  Objective   Blood pressure 139/74, pulse (!) 59, temperature 98.2 F (36.8 C), temperature source Axillary, resp. rate 14, height 5\' 10"  (1.778  m), weight 89 kg, SpO2 99 %.    Vent Mode: PSV;CPAP FiO2 (%):  [40 %-50 %] 40 % Set Rate:  [18 bmp] 18 bmp Vt Set:  [550 mL] 550 mL PEEP:  [5 cmH20] 5 cmH20 Pressure Support:  [5 cmH20] 5 cmH20 Plateau Pressure:  [16 cmH20-17 cmH20] 16 cmH20   Intake/Output Summary (Last 24 hours) at 12/02/2021 0952 Last data filed at 12/02/2021 0800 Gross per 24 hour  Intake 2690.19 ml  Output 6425 ml  Net -3734.81 ml    Filed Weights   12/01/21 0745  Weight: 89 kg    Examination: General: Intubated , awake HENT: [place]. L nasal packing, anteriorly saturated with blood.  No active bleeding.  Lungs: chest clear and tolerating pressure support.  Cardiovascular: rrr cap refill brisk  Abdomen: soft ndnt  Extremities: no acute joint deformity no cyanosis or clubbing  Neuro: Intubated sedated, comfortable, moves all limbs.  GU: distended bladder   Ancillary tests personally reviewed:   CXR post procedure normal  Creatinine stable at 1.66 HB 8.2 down from 9.9  Assessment & Plan:   Recurrent epistaxis, right sided, with associated ABLA COVID-19 infection HTN Hx breast cancer - tamoxifen  Hypothyroidism - cytomel and synthroid  Melena  Hypokalemia - received 101mEq Kcl-- AM BMP Hyponatremia, asymptomatic - AM BMP PONV  Plan:   - Extubate today and advance diet.  - Continue to keep BP < 140 for today.  - Can potentially return to floor this afternoon.   Best Practice (right click and "Reselect all SmartList Selections" daily)   Diet/type: NPO - regular diet post extubation.  DVT prophylaxis: SCD GI prophylaxis: N/A Lines: N/A Foley:  N/A Code Status:  full code Last date of multidisciplinary goals of care discussion [12/01/21 - pt and daughter, CCM + IR ]  CRITICAL CARE Performed by: Kipp Brood   Total critical care time: 35 minutes  Critical care time was exclusive of separately billable procedures and treating other patients.  Critical care was necessary to treat or  prevent imminent or life-threatening deterioration.  Critical care was time spent personally by me on the following activities: development of treatment plan with patient and/or surrogate as well as nursing, discussions with consultants, evaluation of patient's response to treatment, examination of patient, obtaining history from patient or surrogate, ordering and performing treatments and interventions, ordering and review of laboratory studies, ordering and review of radiographic studies, pulse oximetry, re-evaluation of patient's condition and participation in multidisciplinary rounds.  Kipp Brood, MD [facility] ICU Physician [place]  Pager: [phone] Mobile: [phone] After hours: [phone].   12/02/2021, 9:52 AM

## 2021-12-02 NOTE — Progress Notes (Signed)
°  Transition of Care [facility]) Screening Note   Patient Details  Name: Karen Mills Date of Birth: 08-Mar-1956   Transition of Care [facility]) CM/SW Contact:    Alfredia Ferguson, LCSW Phone Number: 12/02/2021, 8:17 AM    Transition of Care Department [facility]) has reviewed patient and noted no immediate TOC needs pending continued medical work-up. TOC team will continue to monitor patient advancement through interdisciplinary progression rounds to support any identified discharge supports as needed. If new patient transition needs arise, please place a TOC consult or reach out to [facility] team.

## 2021-12-02 NOTE — Progress Notes (Addendum)
Referring Physician(s): Melissa Montane (ENT)/ TRH/ PCCM  Supervising Physician: Luanne Bras  Patient Status:  [facility] - In-pt  Chief Complaint:  Symptomatic anemia secondary to persistent and recurrent epistaxis S/p superselective embolization of bilateral IMA branches and Lt facial artery nasal branches performed by Dr. Estanislado Pandy on 12/01/2021  Subjective:  Patient laying in bed, not in acute distress.  Daughter at the bedside. Patient reports that she has not seen further nosebleed and she feels much better than yesterday.   Allergies: Accupril [quinapril hcl], Amlodipine besylate, Atacand [candesartan], Cozaar [losartan potassium], Maxzide [hydrochlorothiazide w-triamterene], Tiazac [diltiazem hcl er beads], and Zestril [lisinopril]  Medications: Prior to Admission medications   Medication Sig Start Date End Date Taking? Authorizing Provider  acetaminophen (TYLENOL) 650 MG CR tablet Take 1,300 mg by mouth 2 (two) times daily as needed for pain.   Yes [provider]  allopurinol (ZYLOPRIM) 300 MG tablet Take 300 mg by mouth every morning. 10/24/21  Yes [provider]  aspirin EC 81 MG tablet Take 1 tablet (81 mg total) by mouth daily. Swallow whole. Patient taking differently: Take 81 mg by mouth every morning. Swallow whole. 10/27/21  Yes Early Osmond, MD  B Complex-C (SUPER B COMPLEX PO) Take 1 tablet by mouth every morning.   Yes [provider]  Biotin 10000 MCG TABS Take 10,000 mcg by mouth every morning.   Yes [provider]  carvedilol (COREG) 6.25 MG tablet Take 6.25 mg by mouth 2 (two) times daily. 10/24/21  Yes [provider]  cholecalciferol (VITAMIN D3) 25 MCG (1000 UT) tablet Take 1,000 Units by mouth every morning.   Yes [provider]  colchicine 0.6 MG tablet TAKE 1 TABLET BY MOUTH EVERY DAY Patient taking differently: Take 0.6 mg by mouth daily as needed (gout attacks). 09/04/21  Yes Regal, Tamala Fothergill,  DPM  furosemide (LASIX) 20 MG tablet Take 20 mg by mouth 2 (two) times daily with breakfast and lunch.   Yes [provider]  hydrALAZINE (APRESOLINE) 100 MG tablet Take 100 mg by mouth 3 (three) times daily.   Yes [provider]  levothyroxine (SYNTHROID) 100 MCG tablet Take 100 mcg by mouth daily before breakfast. 08/08/21  Yes [provider]  liothyronine (CYTOMEL) 5 MCG tablet Take 5 mcg by mouth daily before breakfast. 09/22/21  Yes [provider]  Omega-3 Fatty Acids (FISH OIL PO) Take 800 mg by mouth every morning.   Yes [provider]  predniSONE (DELTASONE) 20 MG tablet Take 20 mg by mouth 2 (two) times daily. 11/28/21  Yes [provider]  pyridOXINE (VITAMIN B-6) 100 MG tablet Take 100 mg by mouth in the morning.   Yes [provider]  tamoxifen (NOLVADEX) 10 MG tablet Take 1 tablet (10 mg total) by mouth daily. Patient taking differently: Take 10 mg by mouth every morning. 08/14/21  Yes Nicholas Lose, MD  Vitamin E 670 MG (1000 UT) CAPS Take 1,000 Units by mouth every morning.   Yes [provider]     Vital Signs: BP (!) 147/63    Pulse (!) 59    Temp 98.2 F (36.8 C) (Axillary)    Resp 14    Ht 5\' 10"  (1.778 m)    Wt 196 lb 3.4 oz (89 kg)    SpO2 100%    BMI 28.15 kg/m   Physical Exam Vitals reviewed.  Constitutional:      General: She is not in acute distress.  Appearance: Normal appearance. She is not ill-appearing.  HENT:     Head: Normocephalic and atraumatic.     Nose:     Comments: Packing present in left nares, old blood noted in packing, no acute bleeding noted Cardiovascular:     Comments: DP intact bilaterally, left slightly stronger than right Pulmonary:     Effort: Pulmonary effort is normal.  Skin:    General: Skin is warm and dry.     Coloration: Skin is not jaundiced or pale.     Comments: Positive dressing on right groin puncture site. Site is unremarkable with no erythema,  edema, tenderness, bleeding or drainage. Minimal amount of old, dry blood noted on the dressing. Dressing otherwise clean, dry, and intact.    Neurological:     Mental Status: She is alert and oriented to person, place, and time.  Psychiatric:        Mood and Affect: Mood normal.        Behavior: Behavior normal.        Judgment: Judgment normal.    Imaging: DG CHEST PORT 1 VIEW  Result Date: 12/01/2021 CLINICAL DATA:  Respiratory failure EXAM: PORTABLE CHEST 1 VIEW COMPARISON:  None. FINDINGS: Endotracheal tube seen 6.4 cm above the carina. Mild left basilar atelectasis. Lungs are otherwise clear. No pneumothorax or pleural effusion. Cardiac size within normal limits. Pulmonary vascularity is normal. No acute bone abnormality. Surgical clips noted within the left axilla. IMPRESSION: Mild left basilar atelectasis. Electronically Signed   By: Fidela Salisbury M.D.   On: 12/01/2021 23:07    Labs:  CBC: Recent Labs    12/01/21 0752 12/01/21 1713 12/02/21 0004 12/02/21 0339  WBC 15.4* 10.3  --  12.1*  HGB 5.2* 8.0* 9.9* 8.2*  HCT 15.7* 22.6* 29.0* 23.9*  PLT 354 294  --  271    COAGS: Recent Labs    12/01/21 0752  INR 0.9    BMP: Recent Labs    12/01/21 0752 12/02/21 0004 12/02/21 0339  NA 126* 137 137  K 3.1* 3.4* 3.5  CL 99  --  110  CO2 15*  --  17*  GLUCOSE 121*  --  146*  BUN 74*  --  56*  CALCIUM 8.4*  --  7.5*  CREATININE 1.69*  --  1.66*  GFRNONAA 33*  --  34*    LIVER FUNCTION TESTS: Recent Labs    12/01/21 0752  BILITOT 0.2*  AST 18  ALT 14  ALKPHOS 38  PROT 5.6*  ALBUMIN 2.5*    Assessment and Plan:  66 y.o. female with persistent and recurrent epistaxis, s/p superselective embolization of bilateral IMA branches and left facial artery nasal branches performed by Dr. Estanislado Pandy on 12/01/2021.   VS hypertensive 147/63  Hgb stable RF stable Patient reports no further nosebleed R CFA puncture site stable, no signs and symptoms of acute bleeding  or infection. DP intact bilaterally left slightly stronger than right  OK to be transferred to floor per Dr. Estanislado Pandy.   Further treatment plan per PCCM/ ENT/ TRH Appreciate and agree with the plan.  NIR will sign off, please call for questions and concerns.   Electronically Signed: Tera Mater, PA-C 12/02/2021, 12:18 PM   I spent a total of 25 Minutes at the the patient's bedside AND on the patient's hospital floor or unit, greater than 50% of which was counseling/coordinating care for persistent epistaxis, s/p superselective embolization by Dr. Estanislado Pandy.  This chart was dictated using voice recognition  software.  Despite best efforts to proofread,  errors can occur which can change the documentation meaning.

## 2021-12-02 NOTE — Progress Notes (Signed)
Pt placed on PS/CPAP 5/5 on 40% and is tolerating well. RT will continue to monitor.

## 2021-12-02 NOTE — Progress Notes (Signed)
eLink Physician-Brief Progress Note Patient Name: Karen Mills DOB: 08/12/1956 MRN: [id number]   Date of Service  12/02/2021  HPI/Events of Note  insomnia  eICU Interventions  PRN melatonin ordered.  Will monitor response        Karen Mills 12/02/2021, 8:48 PM

## 2021-12-02 NOTE — Procedures (Signed)
Extubation Procedure Note  Patient Details:   Name: CHARLEENE CALLEGARI DOB: 1955/10/14 MRN: [id number]   Airway Documentation:  Airway 7.5 mm (Active)  Secured at (cm) 24 cm 12/02/21 0749  Measured From Lips 12/02/21 [place] 12/02/21 0749  Secured By [employer] 12/02/21 0749  Tube Holder Repositioned Yes 12/02/21 0749  Prone position No 12/02/21 0749  Cuff Pressure (cm H2O) Clear OR 27-39 CmH2O 12/02/21 0343  Site Condition Dry 12/02/21 0749   Vent end date: 12/02/21 Vent end time: 0820   Evaluation  O2 sats: stable throughout Complications: No apparent complications Patient did tolerate procedure well. Bilateral Breath Sounds: Clear   Yes  Pt extubated to 2l [place] with RN at bedside. Positive cuff leak noted and pt is tolerating well. RT will continue to monitor.  Cathie Olden 12/02/2021, 8:23 AM

## 2021-12-02 NOTE — Progress Notes (Signed)
Pt hypertensive. PRN Hydralazine given per order. Home meds verified with pt. Pt receiving home meds with the exception of Lasix. Pt takes 20 mg Lasix bid at home. Dr. Lynetta Mare notifed and orders for Lasix received.

## 2021-12-03 DIAGNOSIS — R040 Epistaxis: Secondary | ICD-10-CM | POA: Diagnosis not present

## 2021-12-03 MED ORDER — FAMOTIDINE 20 MG PO TABS
20.0000 mg | ORAL_TABLET | Freq: Every day | ORAL | Status: DC
  Administered 2021-12-03: 20 mg via ORAL
  Filled 2021-12-03: qty 1

## 2021-12-03 MED ORDER — SALINE SPRAY 0.65 % NA SOLN
1.0000 | Freq: Two times a day (BID) | NASAL | Status: DC
  Administered 2021-12-03 – 2021-12-04 (×3): 1 via NASAL
  Filled 2021-12-03: qty 44

## 2021-12-03 NOTE — Progress Notes (Signed)
History of Present Illness:    66 yo F PMH Breast cancer, HTN, Hypothyroidism who presented to ED 2/24 with epistaxis. Began 2/21, was blowing chunks of blood out of nose. She was Rx prednisone by urgent care for URI sx. Later presented to [facility] 2.23 and received afrin, lido with some improvement. Bleeding has recurred and persisted, prompting ED presentation. In ED, noted to have hgb 5.3. Transfused 2 PRBC. Failed anterior packing and was posteriorly packed. Found to be COVID-19 positive.  Admitted to [facility]. ENT consulted, at time of eval, bleeding had ceased.    2/24 afternoon, bleeding recurred. TRH discussed with ENT who rec IR eval for embo. IR was indicated that pt will require ICU care after procedure. PCCM consulted in this setting.     Does not take blood thinners. Associated black stools.     Pertinent  Medical History    Breast cancer  Htn HLD Hypothyroidism  PONV Significant Hospital Events: Including procedures, antibiotic start and stop dates in addition to other pertinent events   2/24 multiple nasal packings -- anterior and posterior-- for significant epistaxis with associated ABLA requiring 2 PRBC and hospitalist admission. Rebled in afternoon, IR planning for embo, and advises ICU transfer after procedure  2/25 Bilateral common carotid andbilateral external, carotid arteriograms followed by superselective embolization of bilateral IMA branches and Lt facial artery nasal branches with 250 to 300 and 350 to 500 PVA particles to stasis. RT CFA approach .   Interim History / Subjective:    No further bleeding. Patient awake and following commands.  Still has Foley catheter in   Objective   Blood pressure 139/74, pulse (!) 59, temperature 98.2 F (36.8 C), temperature source Axillary, resp. rate 14, height 5\' 10"  (1.778 m), weight 89 kg, SpO2 99 %.      Intake/Output Summary (Last 24 hours) at 12/02/2021 0952 Last data filed at 12/02/2021 0800    Gross per 24 hour  Intake  2690.19 ml  Output 6425 ml  Net -3734.81 ml         Filed Weights    12/01/21 0745  Weight: 89 kg      Examination: General:  awake HENT: [place]. L nasal packing, anteriorly saturated with blood.  No active bleeding.  Lungs: chest clear and tolerating pressure support.  Cardiovascular: rrr cap refill brisk  Abdomen: soft ndnt  Extremities: no acute joint deformity no cyanosis or clubbing  Neuro: Cranial nerves II through grossly intact no focal neurological deficits GU: distended bladder    Ancillary tests personally reviewed:   CXR post procedure normal  Creatinine stable at 1.66 HB 8.2 down from 9.9   Assessment & Plan:    Recurrent epistaxis, right sided, with associated ABLA COVID-19 infection HTN Hx breast cancer - tamoxifen  Hypothyroidism - cytomel and synthroid  Melena  Hypokalemia - received 72mEq Kcl-- AM BMP Hyponatremia, asymptomatic - AM BMP PONV   Plan:    -DC Foley catheter, ambulate - Continue to keep BP < 140 for today.  -Likely discharge in the next 24 hours -ENT following

## 2021-12-03 NOTE — Progress Notes (Signed)
Patient ID: MEA OZGA, female   DOB: April 04, 1956, 66 y.o.   MRN: [id number]  She was embolized for epistaxis and has had no further bleeding. The left pack was removed and no bleeding. She should use saline spray BID in both sides. Follow up as needed.

## 2021-12-04 ENCOUNTER — Encounter (HOSPITAL_COMMUNITY): Payer: Self-pay | Admitting: Interventional Radiology

## 2021-12-04 DIAGNOSIS — R040 Epistaxis: Secondary | ICD-10-CM | POA: Diagnosis not present

## 2021-12-04 LAB — CBC WITH DIFFERENTIAL/PLATELET
Abs Immature Granulocytes: 0.19 10*3/uL — ABNORMAL HIGH (ref 0.00–0.07)
Basophils Absolute: 0 10*3/uL (ref 0.0–0.1)
Basophils Relative: 0 %
Eosinophils Absolute: 0.2 10*3/uL (ref 0.0–0.5)
Eosinophils Relative: 2 %
HCT: 20.8 % — ABNORMAL LOW (ref 36.0–46.0)
Hemoglobin: 7.1 g/dL — ABNORMAL LOW (ref 12.0–15.0)
Immature Granulocytes: 2 %
Lymphocytes Relative: 15 %
Lymphs Abs: 1.4 10*3/uL (ref 0.7–4.0)
MCH: 33.6 pg (ref 26.0–34.0)
MCHC: 34.1 g/dL (ref 30.0–36.0)
MCV: 98.6 fL (ref 80.0–100.0)
Monocytes Absolute: 0.7 10*3/uL (ref 0.1–1.0)
Monocytes Relative: 7 %
Neutro Abs: 6.9 10*3/uL (ref 1.7–7.7)
Neutrophils Relative %: 74 %
Platelets: 257 10*3/uL (ref 150–400)
RBC: 2.11 MIL/uL — ABNORMAL LOW (ref 3.87–5.11)
RDW: 17.2 % — ABNORMAL HIGH (ref 11.5–15.5)
WBC: 9.3 10*3/uL (ref 4.0–10.5)
nRBC: 0 % (ref 0.0–0.2)

## 2021-12-04 LAB — BASIC METABOLIC PANEL
Anion gap: 8 (ref 5–15)
BUN: 24 mg/dL — ABNORMAL HIGH (ref 8–23)
CO2: 22 mmol/L (ref 22–32)
Calcium: 7.6 mg/dL — ABNORMAL LOW (ref 8.9–10.3)
Chloride: 107 mmol/L (ref 98–111)
Creatinine, Ser: 1.58 mg/dL — ABNORMAL HIGH (ref 0.44–1.00)
GFR, Estimated: 36 mL/min — ABNORMAL LOW (ref >60)
Glucose, Bld: 145 mg/dL — ABNORMAL HIGH (ref 70–99)
Potassium: 2.7 mmol/L — CL (ref 3.5–5.1)
Sodium: 137 mmol/L (ref 135–145)

## 2021-12-04 MED ORDER — POTASSIUM CHLORIDE 20 MEQ PO PACK
40.0000 meq | PACK | ORAL | Status: AC
  Administered 2021-12-04 (×2): 40 meq via ORAL
  Filled 2021-12-04: qty 2

## 2021-12-04 NOTE — Progress Notes (Signed)
Hgb 7.1, Dr. Doristine Bosworth notified.

## 2021-12-04 NOTE — Care Management Important Message (Signed)
Important Message  Patient Details  Name: Karen Mills MRN: [id number] Date of Birth: 1956/07/27   Medicare Important Message Given:  Other (see comment)     Hannah Beat 12/04/2021, 12:16 PM

## 2021-12-04 NOTE — Discharge Instructions (Signed)
Please put a Band-Aid over right inguinal incision site and keep it dry and clean.  Please avoid going up or down the stairs for at least 2 days and keep the right groin wound dry and change Band-Aid daily for next couple of days until it is healed.

## 2021-12-04 NOTE — Progress Notes (Signed)
2/27 Mailed IM Letter to patient's mailing address due to patient's isolation status.

## 2021-12-04 NOTE — Discharge Summary (Addendum)
[place] Discharge Summary  Karen Mills TGG:[id number] DOB: October 04, 1956 DOA: 12/01/2021  PCP: Lawerance Cruel, MD  Admit date: 12/01/2021 Discharge date: 12/04/2021 30 Day Unplanned Readmission Risk Score    Flowsheet Row ED to Hosp-Admission (Current) from 12/01/2021 in [place]  30 Day Unplanned Readmission Risk Score (%) 23.64 Filed at 12/04/2021 0801       This score is the patient's risk of an unplanned readmission within 30 days of being discharged (0 -100%). The score is based on dignosis, age, lab data, medications, orders, and past utilization.   Low:  0-14.9   Medium: 15-21.9   High: 22-29.9   Extreme: 30 and above          Admitted From: Home Disposition: Home  Recommendations for Outpatient Follow-up:  Follow up with PCP in 1-2 weeks Please obtain BMP/CBC in one week Follow-up with ENT in 2 weeks if needed Please follow up with your PCP on the following pending results: Unresulted Labs (From admission, onward)     Start     Ordered   12/04/21 7035  Basic metabolic panel  Once,   R       Question:  Specimen collection method  Answer:  Lab=Lab collect   12/04/21 0829              Home Health: None Equipment/Devices: None  Discharge Condition: Stable CODE STATUS: Full code Diet recommendation: Cardiac  Subjective: Seen and examined.  No complaints.  Daughter at the bedside.  She wants to go home.  Brief/Interim Summary: 66 yo F PMH Breast cancer, HTN, Hypothyroidism who presented to ED 2/24 with epistaxis. Began 2/21, was blowing chunks of blood out of nose. She was Rx prednisone by urgent care for URI sx. Later presented to [facility] 2.23 and received afrin, lido with some improvement. Bleeding has recurred and persisted, prompting ED presentation. In ED, noted to have hgb 5.3. Transfused 2 PRBC. Failed anterior packing and was posteriorly packed. Found to be COVID-19 positive.  Admitted to [facility]. ENT consulted, at time of  eval, bleeding had ceased.    2/24 afternoon, bleeding recurred. TRH discussed with ENT who rec IR eval for embo. IR was indicated that pt will require ICU care after procedure. PCCM consulted and patient was transferred to ICU.  2/25 Bilateral common carotid andbilateral external, carotid arteriograms followed by superselective embolization of bilateral IMA branches and Lt facial artery nasal branches with 250 to 300 and 350 to 500 PVA particles to stasis. RT CFA approach .  2/26.  Transferred under [place].  Seen by ENT and cleared for discharge but for some reason, she was not discharged home.  2/27.  Seen by myself for the first time today.  She is stable with no bleeding.  Daughter at the bedside.  Patient wants to go home.  I consulted with PT OT who assessed her and they do not think that she needs any home health PT OT.  Discussed with ENT Dr. Janace Hoard who does not recommend any further IV antibiotics here or at home any oral antibiotics.  Discussed with Dr. Estanislado Pandy who recommended removing the dressing from the right groin area and just placing the Band-Aid and replacing Band-Aid every day and keeping the wound dry and clean.  Patient informed.  Update: Patient CBC and CMP resulted after discharge was completed.  Her hemoglobin dropped to 7.1.  She was offered blood transfusion however patient chose to follow-up with PCP in 2 to 3  days and repeat CBC instead of getting a blood transfusion.  Her potassium was 2.7.  Patient will receive 2 doses of KCl 40 mEq before discharge today.  Regarding her incidental COVID-19: Since she was asymptomatic, she was not treated with anything.  Discharge plan was discussed with patient and/or family member and they verbalized understanding and agreed with it.  Discharge Diagnoses:  Principal Problem:   Acute posterior epistaxis Active Problems:   Malignant neoplasm of lower-outer quadrant of left breast of female, estrogen receptor positive (HCC)    Symptomatic anemia   Hyperlipidemia   Hypertension   Hypothyroidism   COVID-19 virus infection   Epistaxis   ABLA (acute blood loss anemia)   Endotracheally intubated    Discharge Instructions   Allergies as of 12/04/2021       Reactions   Accupril [quinapril Hcl] Cough   Amlodipine Besylate Swelling   Leg swelling   Atacand [candesartan] Other (See Comments)   Unknown reaction   Cozaar [losartan Potassium] Other (See Comments)   Stomach upset   Maxzide [hydrochlorothiazide W-triamterene] Other (See Comments)   Unknown reaction   Tiazac [diltiazem Hcl Er Beads] Other (See Comments)   Unknown reaction   Zestril [lisinopril] Cough        Medication List     TAKE these medications    acetaminophen 650 MG CR tablet Commonly known as: TYLENOL Take 1,300 mg by mouth 2 (two) times daily as needed for pain.   allopurinol 300 MG tablet Commonly known as: ZYLOPRIM Take 300 mg by mouth every morning.   aspirin EC 81 MG tablet Take 1 tablet (81 mg total) by mouth daily. Swallow whole. What changed: when to take this   Biotin 10000 MCG Tabs Take 10,000 mcg by mouth every morning.   carvedilol 6.25 MG tablet Commonly known as: COREG Take 6.25 mg by mouth 2 (two) times daily.   cholecalciferol 25 MCG (1000 UNIT) tablet Commonly known as: VITAMIN D3 Take 1,000 Units by mouth every morning.   colchicine 0.6 MG tablet TAKE 1 TABLET BY MOUTH EVERY DAY What changed:  when to take this reasons to take this   FISH OIL PO Take 800 mg by mouth every morning.   furosemide 20 MG tablet Commonly known as: LASIX Take 20 mg by mouth 2 (two) times daily with breakfast and lunch.   hydrALAZINE 100 MG tablet Commonly known as: APRESOLINE Take 100 mg by mouth 3 (three) times daily.   levothyroxine 100 MCG tablet Commonly known as: SYNTHROID Take 100 mcg by mouth daily before breakfast.   liothyronine 5 MCG tablet Commonly known as: CYTOMEL Take 5 mcg by mouth daily  before breakfast.   predniSONE 20 MG tablet Commonly known as: DELTASONE Take 20 mg by mouth 2 (two) times daily.   pyridOXINE 100 MG tablet Commonly known as: VITAMIN B-6 Take 100 mg by mouth in the morning.   SUPER B COMPLEX PO Take 1 tablet by mouth every morning.   tamoxifen 10 MG tablet Commonly known as: NOLVADEX Take 1 tablet (10 mg total) by mouth daily. What changed: when to take this   Vitamin E 670 MG (1000 UT) Caps Take 1,000 Units by mouth every morning.        Follow-up Information     Lawerance Cruel, MD Follow up in 1 week(s).   Specialty: Family Medicine Contact information: [address] [place] [address] [phone]  Allergies  Allergen Reactions   Accupril [Quinapril Hcl] Cough   Amlodipine Besylate Swelling    Leg swelling   Atacand [Candesartan] Other (See Comments)    Unknown reaction   Cozaar [Losartan Potassium] Other (See Comments)    Stomach upset    Maxzide [Hydrochlorothiazide W-Triamterene] Other (See Comments)    Unknown reaction   Tiazac [Diltiazem Hcl Er Beads] Other (See Comments)    Unknown reaction   Zestril [Lisinopril] Cough    Consultations: PCCM, ENT, IR   Procedures/Studies: DG CHEST PORT 1 VIEW  Result Date: 12/01/2021 CLINICAL DATA:  Respiratory failure EXAM: PORTABLE CHEST 1 VIEW COMPARISON:  None. FINDINGS: Endotracheal tube seen 6.4 cm above the carina. Mild left basilar atelectasis. Lungs are otherwise clear. No pneumothorax or pleural effusion. Cardiac size within normal limits. Pulmonary vascularity is normal. No acute bone abnormality. Surgical clips noted within the left axilla. IMPRESSION: Mild left basilar atelectasis. Electronically Signed   By: Fidela Salisbury M.D.   On: 12/01/2021 23:07     Discharge Exam: Vitals:   12/04/21 0504 12/04/21 0806  BP: (!) 136/55 138/71  Pulse: 73 69  Resp: 18 (!) 21  Temp: 98.3 F (36.8 C) 98.2 F (36.8 C)  SpO2: 96% 99%    Vitals:   12/03/21 1954 12/03/21 2333 12/04/21 0504 12/04/21 0806  BP: (!) 152/41 (!) 130/49 (!) 136/55 138/71  Pulse: 75 75 73 69  Resp: 14 16 18  (!) 21  Temp: 98.4 F (36.9 C) 98.5 F (36.9 C) 98.3 F (36.8 C) 98.2 F (36.8 C)  TempSrc: Oral Oral Oral Oral  SpO2: 98% 96% 96% 99%  Weight:      Height:        General: Pt is alert, awake, not in acute distress Cardiovascular: RRR, S1/S2 +, no rubs, no gallops Respiratory: CTA bilaterally, no wheezing, no rhonchi Abdominal: Soft, NT, ND, bowel sounds + Extremities: no edema, no cyanosis    The results of significant diagnostics from this hospitalization (including imaging, microbiology, ancillary and laboratory) are listed below for reference.     Microbiology: Recent Results (from the past 240 hour(s))  Resp Panel by RT-PCR (Flu A&B, Covid) Nasopharyngeal Swab     Status: Abnormal   Collection Time: 12/01/21  8:43 AM   Specimen: Nasopharyngeal Swab; Nasopharyngeal(NP) swabs in vial transport medium  Result Value Ref Range Status   SARS Coronavirus 2 by RT PCR POSITIVE (A) NEGATIVE Final    Comment: (NOTE) SARS-CoV-2 target nucleic acids are DETECTED.  The SARS-CoV-2 RNA is generally detectable in upper respiratory specimens during the acute phase of infection. Positive results are indicative of the presence of the identified virus, but do not rule out bacterial infection or co-infection with other pathogens not detected by the test. Clinical correlation with patient history and other diagnostic information is necessary to determine patient infection status. The expected result is Negative.  Fact Sheet for Patients: EntrepreneurPulse.com.au  Fact Sheet for Healthcare Providers: IncredibleEmployment.be  This test is not yet approved or cleared by the [place] FDA and  has been authorized for detection and/or diagnosis of SARS-CoV-2 by FDA under an Emergency Use  Authorization (EUA).  This EUA will remain in effect (meaning this test can be used) for the duration of  the COVID-19 declaration under Section 564(b)(1) of the A ct, 21 U.S.C. section 360bbb-3(b)(1), unless the authorization is terminated or revoked sooner.     Influenza A by PCR NEGATIVE NEGATIVE Final   Influenza B by PCR NEGATIVE NEGATIVE  Final    Comment: (NOTE) The Xpert Xpress SARS-CoV-2/FLU/RSV plus assay is intended as an aid in the diagnosis of influenza from Nasopharyngeal swab specimens and should not be used as a sole basis for treatment. Nasal washings and aspirates are unacceptable for Xpert Xpress SARS-CoV-2/FLU/RSV testing.  Fact Sheet for Patients: EntrepreneurPulse.com.au  Fact Sheet for Healthcare Providers: IncredibleEmployment.be  This test is not yet approved or cleared by the [place] FDA and has been authorized for detection and/or diagnosis of SARS-CoV-2 by FDA under an Emergency Use Authorization (EUA). This EUA will remain in effect (meaning this test can be used) for the duration of the COVID-19 declaration under Section 564(b)(1) of the Act, 21 U.S.C. section 360bbb-3(b)(1), unless the authorization is terminated or revoked.  Performed at [facility], [place] [address], [place], [place] [address]   MRSA Next Gen by PCR, Nasal     Status: None   Collection Time: 12/02/21 12:30 AM   Specimen: Nasal Mucosa; Nasal Swab  Result Value Ref Range Status   MRSA by PCR Next Gen NOT DETECTED NOT DETECTED Final    Comment: (NOTE) The GeneXpert MRSA Assay (FDA approved for NASAL specimens only), is one component of a comprehensive MRSA colonization surveillance program. It is not intended to diagnose MRSA infection nor to guide or monitor treatment for MRSA infections. Test performance is not FDA approved in patients less than 38 years old. Performed at [facility], [place] [address], [place],  [place] [address]      Labs: BNP (last 3 results) Recent Labs    12/01/21 0752  BNP 75.1   Basic Metabolic Panel: Recent Labs  Lab 12/01/21 0752 12/02/21 0004 12/02/21 0339  NA 126* 137 137  K 3.1* 3.4* 3.5  CL 99  --  110  CO2 15*  --  17*  GLUCOSE 121*  --  146*  BUN 74*  --  56*  CREATININE 1.69*  --  1.66*  CALCIUM 8.4*  --  7.5*   Liver Function Tests: Recent Labs  Lab 12/01/21 0752  AST 18  ALT 14  ALKPHOS 38  BILITOT 0.2*  PROT 5.6*  ALBUMIN 2.5*   No results for input(s): LIPASE, AMYLASE in the last 168 hours. No results for input(s): AMMONIA in the last 168 hours. CBC: Recent Labs  Lab 12/01/21 0752 12/01/21 1713 12/02/21 0004 12/02/21 0339 12/04/21 0959  WBC 15.4* 10.3  --  12.1* 9.3  NEUTROABS  --   --   --  10.3* 6.9  HGB 5.2* 8.0* 9.9* 8.2* 7.1*  HCT 15.7* 22.6* 29.0* 23.9* 20.8*  MCV 104.7* 95.4  --  94.1 98.6  PLT 354 294  --  271 257   Cardiac Enzymes: No results for input(s): CKTOTAL, CKMB, CKMBINDEX, TROPONINI in the last 168 hours. BNP: Invalid input(s): POCBNP CBG: No results for input(s): GLUCAP in the last 168 hours. D-Dimer No results for input(s): DDIMER in the last 72 hours. Hgb A1c No results for input(s): HGBA1C in the last 72 hours. Lipid Profile No results for input(s): CHOL, HDL, LDLCALC, TRIG, CHOLHDL, LDLDIRECT in the last 72 hours. Thyroid function studies No results for input(s): TSH, T4TOTAL, T3FREE, THYROIDAB in the last 72 hours.  Invalid input(s): FREET3 Anemia work up No results for input(s): VITAMINB12, FOLATE, FERRITIN, TIBC, IRON, RETICCTPCT in the last 72 hours. Urinalysis No results found for: COLORURINE, APPEARANCEUR, LABSPEC, [place], GLUCOSEU, HGBUR, BILIRUBINUR, KETONESUR, PROTEINUR, UROBILINOGEN, NITRITE, LEUKOCYTESUR Sepsis Labs Invalid input(s): PROCALCITONIN,  WBC,  Kevin Microbiology Recent Results (from the past 240 hour(s))  Resp Panel by RT-PCR (Flu A&B, Covid) Nasopharyngeal Swab      Status: Abnormal   Collection Time: 12/01/21  8:43 AM   Specimen: Nasopharyngeal Swab; Nasopharyngeal(NP) swabs in vial transport medium  Result Value Ref Range Status   SARS Coronavirus 2 by RT PCR POSITIVE (A) NEGATIVE Final    Comment: (NOTE) SARS-CoV-2 target nucleic acids are DETECTED.  The SARS-CoV-2 RNA is generally detectable in upper respiratory specimens during the acute phase of infection. Positive results are indicative of the presence of the identified virus, but do not rule out bacterial infection or co-infection with other pathogens not detected by the test. Clinical correlation with patient history and other diagnostic information is necessary to determine patient infection status. The expected result is Negative.  Fact Sheet for Patients: EntrepreneurPulse.com.au  Fact Sheet for Healthcare Providers: IncredibleEmployment.be  This test is not yet approved or cleared by the [place] FDA and  has been authorized for detection and/or diagnosis of SARS-CoV-2 by FDA under an Emergency Use Authorization (EUA).  This EUA will remain in effect (meaning this test can be used) for the duration of  the COVID-19 declaration under Section 564(b)(1) of the A ct, 21 U.S.C. section 360bbb-3(b)(1), unless the authorization is terminated or revoked sooner.     Influenza A by PCR NEGATIVE NEGATIVE Final   Influenza B by PCR NEGATIVE NEGATIVE Final    Comment: (NOTE) The Xpert Xpress SARS-CoV-2/FLU/RSV plus assay is intended as an aid in the diagnosis of influenza from Nasopharyngeal swab specimens and should not be used as a sole basis for treatment. Nasal washings and aspirates are unacceptable for Xpert Xpress SARS-CoV-2/FLU/RSV testing.  Fact Sheet for Patients: EntrepreneurPulse.com.au  Fact Sheet for Healthcare Providers: IncredibleEmployment.be  This test is not yet approved or cleared by the  [place] FDA and has been authorized for detection and/or diagnosis of SARS-CoV-2 by FDA under an Emergency Use Authorization (EUA). This EUA will remain in effect (meaning this test can be used) for the duration of the COVID-19 declaration under Section 564(b)(1) of the Act, 21 U.S.C. section 360bbb-3(b)(1), unless the authorization is terminated or revoked.  Performed at [facility], [place] [address], [place], [place] [address]   MRSA Next Gen by PCR, Nasal     Status: None   Collection Time: 12/02/21 12:30 AM   Specimen: Nasal Mucosa; Nasal Swab  Result Value Ref Range Status   MRSA by PCR Next Gen NOT DETECTED NOT DETECTED Final    Comment: (NOTE) The GeneXpert MRSA Assay (FDA approved for NASAL specimens only), is one component of a comprehensive MRSA colonization surveillance program. It is not intended to diagnose MRSA infection nor to guide or monitor treatment for MRSA infections. Test performance is not FDA approved in patients less than 15 years old. Performed at [facility], [place] [address] [place], [place] [address]      Time coordinating discharge: Over 30 minutes  SIGNED:   Darliss Cheney, MD  [facility] 12/04/2021, 11:09 AM  If 7PM-7AM, please contact night-coverage [email or web address]

## 2021-12-04 NOTE — Evaluation (Signed)
OT Cancellation Note  Patient Details Name: Karen Mills MRN: [id number] DOB: 06-22-1956   Cancelled Treatment:    Reason Eval/Treat Not Completed: OT screened, no needs identified, will sign off. Per PT- pt independent with no OT needs.   Jolaine Artist, OT Acute Rehabilitation Services Pager [phone] Office [phone]   Delight Stare 12/04/2021, 9:04 AM

## 2021-12-04 NOTE — Progress Notes (Signed)
Lab called with critical potassium 2.7.  Secure chat sent to Dr. Doristine Bosworth.

## 2021-12-04 NOTE — Evaluation (Signed)
Physical Therapy Evaluation Patient Details Name: Karen Mills MRN: [id number] DOB: 1956-08-25 Today's Date: 12/04/2021  History of Present Illness  Pt is a 66 y/o female admitted secondary to epistaxis. Attempted packing and eventually had to undergo embolization on 2/25. PMH includes HTN and breast cancer.  Clinical Impression  Pt admitted secondary to problem above with deficits below. Pt overall steady with mobility tasks using RW at a min guard to supervision level for safety only; no physical assist required. No LOB noted. Educated about using RW at home for increased safety and walking program to help with building endurance. Reports husband and daughter can assist if needed. No further acute skilled PT needs at this time. Will sign off. If needs change, please re-consult.      Recommendations for follow up therapy are one component of a multi-disciplinary discharge planning process, led by the attending physician.  Recommendations may be updated based on patient status, additional functional criteria and insurance authorization.  Follow Up Recommendations No PT follow up    Assistance Recommended at Discharge Intermittent Supervision/Assistance  Patient can return home with the following  Help with stairs or ramp for entrance;Assist for transportation    Equipment Recommendations None recommended by PT  Recommendations for Other Services       Functional Status Assessment Patient has had a recent decline in their functional status and demonstrates the ability to make significant improvements in function in a reasonable and predictable amount of time.     Precautions / Restrictions Precautions Precautions: None Restrictions Weight Bearing Restrictions: No      Mobility  Bed Mobility               General bed mobility comments: In chair upon entry    Transfers Overall transfer level: Needs assistance Equipment used: Rolling walker (2 wheels) Transfers: Sit  to/from Stand Sit to Stand: Min guard           General transfer comment: Min guard for safety. No physical assist required. Cues for hand placement.    Ambulation/Gait Ambulation/Gait assistance: Supervision Gait Distance (Feet): 50 Feet Assistive device: Rolling walker (2 wheels) Gait Pattern/deviations: Step-through pattern, Decreased stride length Gait velocity: Decreased     General Gait Details: Overall steady gait with use of RW. No LOB noted. Pt reporting some fatigue. Educated about walking program to perform at home.  Stairs            Wheelchair Mobility    Modified Rankin (Stroke Patients Only)       Balance Overall balance assessment: Mild deficits observed, not formally tested                                           Pertinent Vitals/Pain Pain Assessment Pain Assessment: No/denies pain    Home Living Family/patient expects to be discharged to:: Private residence Living Arrangements: Spouse/significant other Available Help at Discharge: Family Type of Home: House Home Access: Stairs to enter Entrance Stairs-Rails: None Entrance Stairs-Number of Steps: 2   Home Layout: One level Home Equipment: Conservation officer, nature (2 wheels)      Prior Function Prior Level of Function : Independent/Modified Independent                     Hand Dominance        Extremity/Trunk Assessment   Upper Extremity Assessment Upper Extremity Assessment: Overall  WFL for tasks assessed    Lower Extremity Assessment Lower Extremity Assessment: Overall WFL for tasks assessed    Cervical / Trunk Assessment Cervical / Trunk Assessment: Normal  Communication   Communication: No difficulties  Cognition Arousal/Alertness: Awake/alert Behavior During Therapy: WFL for tasks assessed/performed Overall Cognitive Status: Within Functional Limits for tasks assessed                                          General Comments  General comments (skin integrity, edema, etc.): Pt's daughter present    Exercises     Assessment/Plan    PT Assessment Patient does not need any further PT services  PT Problem List         PT Treatment Interventions      PT Goals (Current goals can be found in the Care Plan section)  Acute Rehab PT Goals Patient Stated Goal: to go home PT Goal Formulation: With patient Time For Goal Achievement: 12/04/21 Potential to Achieve Goals: Good    Frequency       Co-evaluation               AM-PAC PT "6 Clicks" Mobility  Outcome Measure Help needed turning from your back to your side while in a flat bed without using bedrails?: None Help needed moving from lying on your back to sitting on the side of a flat bed without using bedrails?: None Help needed moving to and from a bed to a chair (including a wheelchair)?: None Help needed standing up from a chair using your arms (e.g., wheelchair or bedside chair)?: A Little Help needed to walk in hospital room?: A Little Help needed climbing 3-5 steps with a railing? : A Little 6 Click Score: 21    End of Session Equipment Utilized During Treatment: Gait belt Activity Tolerance: Patient tolerated treatment well Patient left: in chair;with call bell/phone within reach;with family/visitor present Nurse Communication: Mobility status PT Visit Diagnosis: Other abnormalities of gait and mobility (R26.89)    Time: 3532-9924 PT Time Calculation (min) (ACUTE ONLY): 15 min   Charges:   PT Evaluation $PT Eval Low Complexity: 1 Low          Lou Miner, DPT  Acute Rehabilitation Services  Pager: [phone] Office: [phone]   Rudean Hitt 12/04/2021, 9:56 AM

## 2021-12-05 LAB — TYPE AND SCREEN
ABO/RH(D): O POS
Antibody Screen: NEGATIVE
Unit division: 0
Unit division: 0
Unit division: 0
Unit division: 0
Unit division: 0
Unit division: 0
Unit division: 0

## 2021-12-05 LAB — BPAM RBC
Blood Product Expiration Date: 202303242359
Blood Product Expiration Date: 202303242359
Blood Product Expiration Date: 202303262359
Blood Product Expiration Date: 202303262359
Blood Product Expiration Date: 202303262359
Blood Product Expiration Date: 202303262359
Blood Product Expiration Date: 202303262359
ISSUE DATE / TIME: 202302240907
ISSUE DATE / TIME: 202302241158
ISSUE DATE / TIME: 202302241808
ISSUE DATE / TIME: 202302241808
ISSUE DATE / TIME: 202302241839
Unit Type and Rh: 5100
Unit Type and Rh: 5100
Unit Type and Rh: 5100
Unit Type and Rh: 5100
Unit Type and Rh: 5100
Unit Type and Rh: 5100
Unit Type and Rh: 5100

## 2021-12-06 ENCOUNTER — Other Ambulatory Visit (HOSPITAL_COMMUNITY): Payer: Self-pay | Admitting: Physician Assistant

## 2021-12-06 ENCOUNTER — Encounter (HOSPITAL_COMMUNITY): Payer: Self-pay | Admitting: Radiology

## 2021-12-06 DIAGNOSIS — R040 Epistaxis: Secondary | ICD-10-CM

## 2021-12-07 DIAGNOSIS — U071 COVID-19: Secondary | ICD-10-CM | POA: Diagnosis not present

## 2021-12-07 DIAGNOSIS — D62 Acute posthemorrhagic anemia: Secondary | ICD-10-CM | POA: Diagnosis not present

## 2021-12-07 DIAGNOSIS — R040 Epistaxis: Secondary | ICD-10-CM | POA: Diagnosis not present

## 2021-12-07 DIAGNOSIS — E876 Hypokalemia: Secondary | ICD-10-CM | POA: Diagnosis not present

## 2021-12-26 ENCOUNTER — Other Ambulatory Visit: Payer: Medicare Other | Admitting: *Deleted

## 2021-12-26 ENCOUNTER — Other Ambulatory Visit: Payer: Self-pay

## 2021-12-26 DIAGNOSIS — E785 Hyperlipidemia, unspecified: Secondary | ICD-10-CM

## 2021-12-26 LAB — LIPID PANEL
Chol/HDL Ratio: 6.7 ratio — ABNORMAL HIGH (ref 0.0–4.4)
Cholesterol, Total: 208 mg/dL — ABNORMAL HIGH (ref 100–199)
HDL: 31 mg/dL — ABNORMAL LOW (ref >39)
LDL Chol Calc (NIH): 140 mg/dL — ABNORMAL HIGH (ref 0–99)
Triglycerides: 203 mg/dL — ABNORMAL HIGH (ref 0–149)
VLDL Cholesterol Cal: 37 mg/dL (ref 5–40)

## 2021-12-26 LAB — ALT: ALT: 15 IU/L (ref 0–32)

## 2022-01-02 DIAGNOSIS — J029 Acute pharyngitis, unspecified: Secondary | ICD-10-CM | POA: Diagnosis not present

## 2022-01-03 ENCOUNTER — Encounter: Payer: Self-pay | Admitting: *Deleted

## 2022-01-04 DIAGNOSIS — R040 Epistaxis: Secondary | ICD-10-CM | POA: Diagnosis not present

## 2022-01-04 DIAGNOSIS — H6983 Other specified disorders of Eustachian tube, bilateral: Secondary | ICD-10-CM | POA: Diagnosis not present

## 2022-01-04 DIAGNOSIS — R0982 Postnasal drip: Secondary | ICD-10-CM | POA: Diagnosis not present

## 2022-01-04 DIAGNOSIS — J329 Chronic sinusitis, unspecified: Secondary | ICD-10-CM | POA: Diagnosis not present

## 2022-01-08 DIAGNOSIS — E039 Hypothyroidism, unspecified: Secondary | ICD-10-CM | POA: Diagnosis not present

## 2022-01-08 DIAGNOSIS — R944 Abnormal results of kidney function studies: Secondary | ICD-10-CM | POA: Diagnosis not present

## 2022-01-08 DIAGNOSIS — R899 Unspecified abnormal finding in specimens from other organs, systems and tissues: Secondary | ICD-10-CM | POA: Diagnosis not present

## 2022-01-09 ENCOUNTER — Telehealth: Payer: Self-pay | Admitting: Internal Medicine

## 2022-01-09 MED ORDER — SIMVASTATIN 40 MG PO TABS: 40.0000 mg | ORAL_TABLET | Freq: Every day | ORAL | 3 refills | Status: DC

## 2022-01-09 NOTE — Telephone Encounter (Signed)
Message from Dr. Ali Lowe: ? ?Lets get her to try simvastatin '40mg'$  for a month and let us know how it goes.  If she cannot tolerate, then let's have her see PharmD.  ? ?Spoke with patient.  She is willing to try simvastatin 40 mg and will call with an update in about one month.  Willing to see PharmD if not tolerating. ?

## 2022-01-09 NOTE — Telephone Encounter (Signed)
° °  Pt is returning call to get lab result °

## 2022-01-10 DIAGNOSIS — J324 Chronic pansinusitis: Secondary | ICD-10-CM | POA: Diagnosis not present

## 2022-01-10 DIAGNOSIS — J320 Chronic maxillary sinusitis: Secondary | ICD-10-CM | POA: Diagnosis not present

## 2022-01-10 DIAGNOSIS — J3489 Other specified disorders of nose and nasal sinuses: Secondary | ICD-10-CM | POA: Diagnosis not present

## 2022-01-22 DIAGNOSIS — D531 Other megaloblastic anemias, not elsewhere classified: Secondary | ICD-10-CM | POA: Diagnosis not present

## 2022-01-22 DIAGNOSIS — E782 Mixed hyperlipidemia: Secondary | ICD-10-CM | POA: Diagnosis not present

## 2022-01-22 DIAGNOSIS — N1832 Chronic kidney disease, stage 3b: Secondary | ICD-10-CM | POA: Diagnosis not present

## 2022-01-22 DIAGNOSIS — I10 Essential (primary) hypertension: Secondary | ICD-10-CM | POA: Diagnosis not present

## 2022-01-29 DIAGNOSIS — H903 Sensorineural hearing loss, bilateral: Secondary | ICD-10-CM | POA: Diagnosis not present

## 2022-01-29 DIAGNOSIS — J320 Chronic maxillary sinusitis: Secondary | ICD-10-CM | POA: Diagnosis not present

## 2022-03-21 DIAGNOSIS — M25552 Pain in left hip: Secondary | ICD-10-CM | POA: Diagnosis not present

## 2022-03-21 DIAGNOSIS — M545 Low back pain, unspecified: Secondary | ICD-10-CM | POA: Diagnosis not present

## 2022-03-21 DIAGNOSIS — M25551 Pain in right hip: Secondary | ICD-10-CM | POA: Diagnosis not present

## 2022-03-22 DIAGNOSIS — M6281 Muscle weakness (generalized): Secondary | ICD-10-CM | POA: Diagnosis not present

## 2022-03-22 DIAGNOSIS — M25652 Stiffness of left hip, not elsewhere classified: Secondary | ICD-10-CM | POA: Diagnosis not present

## 2022-03-22 DIAGNOSIS — M25651 Stiffness of right hip, not elsewhere classified: Secondary | ICD-10-CM | POA: Diagnosis not present

## 2022-03-22 DIAGNOSIS — M7602 Gluteal tendinitis, left hip: Secondary | ICD-10-CM | POA: Diagnosis not present

## 2022-03-26 DIAGNOSIS — C50512 Malignant neoplasm of lower-outer quadrant of left female breast: Secondary | ICD-10-CM | POA: Diagnosis not present

## 2022-03-26 DIAGNOSIS — Z170 Estrogen receptor positive status [ER+]: Secondary | ICD-10-CM | POA: Diagnosis not present

## 2022-03-27 DIAGNOSIS — M25651 Stiffness of right hip, not elsewhere classified: Secondary | ICD-10-CM | POA: Diagnosis not present

## 2022-03-27 DIAGNOSIS — M6281 Muscle weakness (generalized): Secondary | ICD-10-CM | POA: Diagnosis not present

## 2022-03-27 DIAGNOSIS — M25652 Stiffness of left hip, not elsewhere classified: Secondary | ICD-10-CM | POA: Diagnosis not present

## 2022-03-27 DIAGNOSIS — M7602 Gluteal tendinitis, left hip: Secondary | ICD-10-CM | POA: Diagnosis not present

## 2022-03-30 DIAGNOSIS — M25652 Stiffness of left hip, not elsewhere classified: Secondary | ICD-10-CM | POA: Diagnosis not present

## 2022-03-30 DIAGNOSIS — M7602 Gluteal tendinitis, left hip: Secondary | ICD-10-CM | POA: Diagnosis not present

## 2022-03-30 DIAGNOSIS — M6281 Muscle weakness (generalized): Secondary | ICD-10-CM | POA: Diagnosis not present

## 2022-03-30 DIAGNOSIS — M25651 Stiffness of right hip, not elsewhere classified: Secondary | ICD-10-CM | POA: Diagnosis not present

## 2022-04-02 DIAGNOSIS — M7602 Gluteal tendinitis, left hip: Secondary | ICD-10-CM | POA: Diagnosis not present

## 2022-04-02 DIAGNOSIS — M25652 Stiffness of left hip, not elsewhere classified: Secondary | ICD-10-CM | POA: Diagnosis not present

## 2022-04-02 DIAGNOSIS — M25651 Stiffness of right hip, not elsewhere classified: Secondary | ICD-10-CM | POA: Diagnosis not present

## 2022-04-02 DIAGNOSIS — M6281 Muscle weakness (generalized): Secondary | ICD-10-CM | POA: Diagnosis not present

## 2022-04-04 ENCOUNTER — Ambulatory Visit
Admission: RE | Admit: 2022-04-04 | Discharge: 2022-04-04 | Disposition: A | Discharge status: Home / Self Care | Payer: Medicare Other | Source: Ambulatory Visit | Attending: Hematology and Oncology | Admitting: Hematology and Oncology

## 2022-04-04 DIAGNOSIS — R921 Mammographic calcification found on diagnostic imaging of breast: Secondary | ICD-10-CM

## 2022-04-04 DIAGNOSIS — Z853 Personal history of malignant neoplasm of breast: Secondary | ICD-10-CM | POA: Diagnosis not present

## 2022-04-06 DIAGNOSIS — M7602 Gluteal tendinitis, left hip: Secondary | ICD-10-CM | POA: Diagnosis not present

## 2022-04-06 DIAGNOSIS — M6281 Muscle weakness (generalized): Secondary | ICD-10-CM | POA: Diagnosis not present

## 2022-04-06 DIAGNOSIS — M25651 Stiffness of right hip, not elsewhere classified: Secondary | ICD-10-CM | POA: Diagnosis not present

## 2022-04-06 DIAGNOSIS — M25652 Stiffness of left hip, not elsewhere classified: Secondary | ICD-10-CM | POA: Diagnosis not present

## 2022-04-09 DIAGNOSIS — M25651 Stiffness of right hip, not elsewhere classified: Secondary | ICD-10-CM | POA: Diagnosis not present

## 2022-04-09 DIAGNOSIS — M6281 Muscle weakness (generalized): Secondary | ICD-10-CM | POA: Diagnosis not present

## 2022-04-09 DIAGNOSIS — M7602 Gluteal tendinitis, left hip: Secondary | ICD-10-CM | POA: Diagnosis not present

## 2022-04-09 DIAGNOSIS — M25652 Stiffness of left hip, not elsewhere classified: Secondary | ICD-10-CM | POA: Diagnosis not present

## 2022-04-11 DIAGNOSIS — M6281 Muscle weakness (generalized): Secondary | ICD-10-CM | POA: Diagnosis not present

## 2022-04-11 DIAGNOSIS — M25652 Stiffness of left hip, not elsewhere classified: Secondary | ICD-10-CM | POA: Diagnosis not present

## 2022-04-11 DIAGNOSIS — M7602 Gluteal tendinitis, left hip: Secondary | ICD-10-CM | POA: Diagnosis not present

## 2022-04-11 DIAGNOSIS — M25651 Stiffness of right hip, not elsewhere classified: Secondary | ICD-10-CM | POA: Diagnosis not present

## 2022-04-16 DIAGNOSIS — M7602 Gluteal tendinitis, left hip: Secondary | ICD-10-CM | POA: Diagnosis not present

## 2022-04-16 DIAGNOSIS — M6281 Muscle weakness (generalized): Secondary | ICD-10-CM | POA: Diagnosis not present

## 2022-04-16 DIAGNOSIS — M25652 Stiffness of left hip, not elsewhere classified: Secondary | ICD-10-CM | POA: Diagnosis not present

## 2022-04-16 DIAGNOSIS — M25651 Stiffness of right hip, not elsewhere classified: Secondary | ICD-10-CM | POA: Diagnosis not present

## 2022-04-19 DIAGNOSIS — M6281 Muscle weakness (generalized): Secondary | ICD-10-CM | POA: Diagnosis not present

## 2022-04-19 DIAGNOSIS — M25652 Stiffness of left hip, not elsewhere classified: Secondary | ICD-10-CM | POA: Diagnosis not present

## 2022-04-19 DIAGNOSIS — M7602 Gluteal tendinitis, left hip: Secondary | ICD-10-CM | POA: Diagnosis not present

## 2022-04-19 DIAGNOSIS — M25651 Stiffness of right hip, not elsewhere classified: Secondary | ICD-10-CM | POA: Diagnosis not present

## 2022-04-23 DIAGNOSIS — M7602 Gluteal tendinitis, left hip: Secondary | ICD-10-CM | POA: Diagnosis not present

## 2022-04-23 DIAGNOSIS — M25652 Stiffness of left hip, not elsewhere classified: Secondary | ICD-10-CM | POA: Diagnosis not present

## 2022-04-23 DIAGNOSIS — M6281 Muscle weakness (generalized): Secondary | ICD-10-CM | POA: Diagnosis not present

## 2022-04-23 DIAGNOSIS — M25651 Stiffness of right hip, not elsewhere classified: Secondary | ICD-10-CM | POA: Diagnosis not present

## 2022-04-24 DIAGNOSIS — D531 Other megaloblastic anemias, not elsewhere classified: Secondary | ICD-10-CM | POA: Diagnosis not present

## 2022-04-26 DIAGNOSIS — M25651 Stiffness of right hip, not elsewhere classified: Secondary | ICD-10-CM | POA: Diagnosis not present

## 2022-04-26 DIAGNOSIS — M7602 Gluteal tendinitis, left hip: Secondary | ICD-10-CM | POA: Diagnosis not present

## 2022-04-26 DIAGNOSIS — M6281 Muscle weakness (generalized): Secondary | ICD-10-CM | POA: Diagnosis not present

## 2022-04-26 DIAGNOSIS — M25652 Stiffness of left hip, not elsewhere classified: Secondary | ICD-10-CM | POA: Diagnosis not present

## 2022-04-30 DIAGNOSIS — M948X9 Other specified disorders of cartilage, unspecified sites: Secondary | ICD-10-CM | POA: Diagnosis not present

## 2022-05-01 DIAGNOSIS — M25652 Stiffness of left hip, not elsewhere classified: Secondary | ICD-10-CM | POA: Diagnosis not present

## 2022-05-01 DIAGNOSIS — M7602 Gluteal tendinitis, left hip: Secondary | ICD-10-CM | POA: Diagnosis not present

## 2022-05-01 DIAGNOSIS — M6281 Muscle weakness (generalized): Secondary | ICD-10-CM | POA: Diagnosis not present

## 2022-05-01 DIAGNOSIS — M25651 Stiffness of right hip, not elsewhere classified: Secondary | ICD-10-CM | POA: Diagnosis not present

## 2022-05-02 DIAGNOSIS — M7602 Gluteal tendinitis, left hip: Secondary | ICD-10-CM | POA: Diagnosis not present

## 2022-05-08 DIAGNOSIS — M25651 Stiffness of right hip, not elsewhere classified: Secondary | ICD-10-CM | POA: Diagnosis not present

## 2022-05-08 DIAGNOSIS — M25652 Stiffness of left hip, not elsewhere classified: Secondary | ICD-10-CM | POA: Diagnosis not present

## 2022-05-08 DIAGNOSIS — M6281 Muscle weakness (generalized): Secondary | ICD-10-CM | POA: Diagnosis not present

## 2022-05-08 DIAGNOSIS — M7602 Gluteal tendinitis, left hip: Secondary | ICD-10-CM | POA: Diagnosis not present

## 2022-05-14 DIAGNOSIS — M25652 Stiffness of left hip, not elsewhere classified: Secondary | ICD-10-CM | POA: Diagnosis not present

## 2022-05-14 DIAGNOSIS — M6281 Muscle weakness (generalized): Secondary | ICD-10-CM | POA: Diagnosis not present

## 2022-05-14 DIAGNOSIS — M25651 Stiffness of right hip, not elsewhere classified: Secondary | ICD-10-CM | POA: Diagnosis not present

## 2022-05-14 DIAGNOSIS — M7602 Gluteal tendinitis, left hip: Secondary | ICD-10-CM | POA: Diagnosis not present

## 2022-05-21 DIAGNOSIS — M25652 Stiffness of left hip, not elsewhere classified: Secondary | ICD-10-CM | POA: Diagnosis not present

## 2022-05-21 DIAGNOSIS — M6281 Muscle weakness (generalized): Secondary | ICD-10-CM | POA: Diagnosis not present

## 2022-05-21 DIAGNOSIS — M7602 Gluteal tendinitis, left hip: Secondary | ICD-10-CM | POA: Diagnosis not present

## 2022-05-21 DIAGNOSIS — M25651 Stiffness of right hip, not elsewhere classified: Secondary | ICD-10-CM | POA: Diagnosis not present

## 2022-05-23 DIAGNOSIS — M25651 Stiffness of right hip, not elsewhere classified: Secondary | ICD-10-CM | POA: Diagnosis not present

## 2022-05-23 DIAGNOSIS — M25652 Stiffness of left hip, not elsewhere classified: Secondary | ICD-10-CM | POA: Diagnosis not present

## 2022-05-23 DIAGNOSIS — M7602 Gluteal tendinitis, left hip: Secondary | ICD-10-CM | POA: Diagnosis not present

## 2022-05-23 DIAGNOSIS — M6281 Muscle weakness (generalized): Secondary | ICD-10-CM | POA: Diagnosis not present

## 2022-06-04 DIAGNOSIS — M25652 Stiffness of left hip, not elsewhere classified: Secondary | ICD-10-CM | POA: Diagnosis not present

## 2022-06-04 DIAGNOSIS — M7602 Gluteal tendinitis, left hip: Secondary | ICD-10-CM | POA: Diagnosis not present

## 2022-06-04 DIAGNOSIS — M6281 Muscle weakness (generalized): Secondary | ICD-10-CM | POA: Diagnosis not present

## 2022-06-04 DIAGNOSIS — M25651 Stiffness of right hip, not elsewhere classified: Secondary | ICD-10-CM | POA: Diagnosis not present

## 2022-06-13 ENCOUNTER — Telehealth: Payer: Self-pay

## 2022-06-13 DIAGNOSIS — R931 Abnormal findings on diagnostic imaging of heart and coronary circulation: Secondary | ICD-10-CM

## 2022-06-13 DIAGNOSIS — E785 Hyperlipidemia, unspecified: Secondary | ICD-10-CM

## 2022-06-13 NOTE — Telephone Encounter (Signed)
-----   Message from Early Osmond, MD sent at 06/13/2022 10:43 AM EDT ----- Regarding: RE: Medication Adherence Question Pam,      Can you reach our to patient and see if she is taking the simvastatin and let Dylan know.  Thanks. ----- Message ----- From: Aggie Moats, CPhT Sent: 06/13/2022  10:27 AM EDT To: Early Osmond, MD Subject: Medication Adherence Question                  Good morning,  My name is Dylan with the medication adherence team for [facility]. This patient appeared on my report as needing a refill for her Simvastatin. I noticed in her chart that this medication was a trial for a month to see if it could be tolerated. I wanted to reach out to see if this medication should still be on her active med list and if so I can reach out to the patient to refill or if this medication was discontinued.   Thank you for your time.

## 2022-06-13 NOTE — Telephone Encounter (Signed)
Called patient about her simvastatin. Patient stated that she was unable to tolerate medication. Patient agreed to see our lipid clinic and requested to have lab work done. Consulted pharmacist, she suggested patient get up-to-date lab work prior to lipid clinic visit. Patient will come in on 07/10/22 for lab work and see lipid clinic on 07/16/22.

## 2022-06-19 DIAGNOSIS — M6281 Muscle weakness (generalized): Secondary | ICD-10-CM | POA: Diagnosis not present

## 2022-06-19 DIAGNOSIS — M25652 Stiffness of left hip, not elsewhere classified: Secondary | ICD-10-CM | POA: Diagnosis not present

## 2022-06-19 DIAGNOSIS — M25651 Stiffness of right hip, not elsewhere classified: Secondary | ICD-10-CM | POA: Diagnosis not present

## 2022-06-19 DIAGNOSIS — M7602 Gluteal tendinitis, left hip: Secondary | ICD-10-CM | POA: Diagnosis not present

## 2022-07-10 ENCOUNTER — Ambulatory Visit: Payer: Medicare Other | Attending: Internal Medicine

## 2022-07-10 DIAGNOSIS — R931 Abnormal findings on diagnostic imaging of heart and coronary circulation: Secondary | ICD-10-CM

## 2022-07-10 DIAGNOSIS — E785 Hyperlipidemia, unspecified: Secondary | ICD-10-CM

## 2022-07-11 LAB — LIPID PANEL
Chol/HDL Ratio: 3.9 ratio (ref 0.0–4.4)
Cholesterol, Total: 196 mg/dL (ref 100–199)
HDL: 50 mg/dL (ref >39)
LDL Chol Calc (NIH): 123 mg/dL — ABNORMAL HIGH (ref 0–99)
Triglycerides: 127 mg/dL (ref 0–149)
VLDL Cholesterol Cal: 23 mg/dL (ref 5–40)

## 2022-07-15 NOTE — Progress Notes (Unsigned)
Patient ID: Karen Mills                 DOB: [birth year]                    MRN: [id number]     HPI: Karen Mills is a 66 y.o. female patient referred to lipid clinic by [place]. PMH is significant for breast cancer, CKD, gout, HLD, hypothyroidism, and HTN. Pt was last seen by Dr. Ali Lowe on 10/12/21. At that time, pt was referred for echocardiogram and CTA. Echocardiogram on 10/26/21 showed an EF of 63% and CTA showed a CAC score of 342 in the 92nd percentile. Pt is noted to have atorvastatin intolerance due to myalgias. After the CTA results, the patient was instructed to start simvastatin and referred to Pharm[facility] lipid clinic.  Plan: Crestor + Zetia  Insurance [employer] [place]  Current Medications: simvastatin 40 mg daily Intolerances: atorvastatin 20 and 40 mg (Myalgias) Risk Factors: HTN, CKD, 10 year ASCVD risk of 12.47% using MESA score LDL goal: <70  Diet:   Exercise:   Family History:  Family History  Problem Relation Age of Onset   Heart disease Mother    Heart disease Father    Heart disease Brother    Pancreatic cancer Half-Brother        d. early 36s    Social History:  Social History   Socioeconomic History   Marital status: Married    Spouse name: Not on file   Number of children: Not on file   Years of education: Not on file   Highest education level: Not on file  Occupational History   Occupation: retired  Tobacco Use   Smoking status: Never   Smokeless tobacco: Never  Vaping Use   Vaping Use: Never used  Substance and Sexual Activity   Alcohol use: Yes    Comment: 2-3x per week   Drug use: No   Sexual activity: Not on file  Other Topics Concern   Not on file  Social History Narrative   Not on file   Social Determinants of Health   Financial Resource Strain: Not on file  Food Insecurity: Not on file  Transportation Needs: No Transportation Needs (04/16/2019)   PRAPARE - Hydrologist (Medical): No    Lack of  Transportation (Non-Medical): No  Physical Activity: Not on file  Stress: Not on file  Social Connections: Not on file  Intimate Partner Violence: Not on file   Labs:  Lipid panel 07/10/22 on ??simvastatin: TC 196, TG 127, HDL 50, VLDL 23, LDL 123  Lipid panel  12/26/2021 on no medication: TC 208, TG 203, HDL 31, VLDL 37, LDL 140  Past Medical History:  Diagnosis Date   Breast cancer ([place]) 2020   Left Breast Cancer   CKD (chronic kidney disease)    Gout    Hyperlipidemia    Hypertension    Hypothyroidism    Megaloblastic anemia    Obesity    Personal history of radiation therapy 2020   Left Breast Cancer   PONV (postoperative nausea and vomiting)    SCC (squamous cell carcinoma) Keratoacanthoma 10/10/2016   Right Shin (Cx3,5FU)   Squamous cell carcinoma in situ (SCCIS) 11/15/2016   Right Lower Shin (tx p bx)   Varicose veins     Current Outpatient Medications on File Prior to Visit  Medication Sig Dispense Refill   acetaminophen (TYLENOL) 650 MG CR tablet Take 1,300 mg  by mouth 2 (two) times daily as needed for pain.     allopurinol (ZYLOPRIM) 300 MG tablet Take 300 mg by mouth every morning.     aspirin EC 81 MG tablet Take 1 tablet (81 mg total) by mouth daily. Swallow whole. (Patient taking differently: Take 81 mg by mouth every morning. Swallow whole.) 90 tablet 3   B Complex-C (SUPER B COMPLEX PO) Take 1 tablet by mouth every morning.     Biotin 10000 MCG TABS Take 10,000 mcg by mouth every morning.     carvedilol (COREG) 6.25 MG tablet Take 6.25 mg by mouth 2 (two) times daily.     cholecalciferol (VITAMIN D3) 25 MCG (1000 UT) tablet Take 1,000 Units by mouth every morning.     colchicine 0.6 MG tablet TAKE 1 TABLET BY MOUTH EVERY DAY (Patient taking differently: Take 0.6 mg by mouth daily as needed (gout attacks).) 90 tablet 1   furosemide (LASIX) 20 MG tablet Take 20 mg by mouth 2 (two) times daily with breakfast and lunch.     hydrALAZINE (APRESOLINE) 100 MG  tablet Take 100 mg by mouth 3 (three) times daily.     levothyroxine (SYNTHROID) 100 MCG tablet Take 100 mcg by mouth daily before breakfast.     liothyronine (CYTOMEL) 5 MCG tablet Take 5 mcg by mouth daily before breakfast.     Omega-3 Fatty Acids (FISH OIL PO) Take 800 mg by mouth every morning.     predniSONE (DELTASONE) 20 MG tablet Take 20 mg by mouth 2 (two) times daily.     pyridOXINE (VITAMIN B-6) 100 MG tablet Take 100 mg by mouth in the morning.     simvastatin (ZOCOR) 40 MG tablet Take 1 tablet (40 mg total) by mouth daily at 6 PM. 90 tablet 3   tamoxifen (NOLVADEX) 10 MG tablet Take 1 tablet (10 mg total) by mouth daily. (Patient taking differently: Take 10 mg by mouth every morning.) 90 tablet 3   Vitamin E 670 MG (1000 UT) CAPS Take 1,000 Units by mouth every morning.     No current facility-administered medications on file prior to visit.    Allergies  Allergen Reactions   Accupril [Quinapril Hcl] Cough   Amlodipine Besylate Swelling    Leg swelling   Atacand [Candesartan] Other (See Comments)    Unknown reaction   Atorvastatin Other (See Comments)    Joint pain   Cozaar [Losartan Potassium] Other (See Comments)    Stomach upset    Maxzide [Hydrochlorothiazide W-Triamterene] Other (See Comments)    Unknown reaction   Tiazac [Diltiazem Hcl Er Beads] Other (See Comments)    Unknown reaction   Zestril [Lisinopril] Cough    Assessment/Plan:  1. Hyperlipidemia -

## 2022-07-16 ENCOUNTER — Ambulatory Visit: Payer: Medicare Other

## 2022-08-26 NOTE — Progress Notes (Unsigned)
Patient ID: Karen Mills                 DOB: 1956/02/18                    MRN: [id number]      HPI: Karen Mills is a 66 y.o. female patient referred to lipid clinic by Dr.Thukkani. PMH is significant for hypertension, hypothyroid, hyperlipidemia. simvastatin 40 mg were started in April 2023. Unable to tolerate simvastatin 40 mg so stopped taking it.   Today patient has no acute concern. Getting over common cold. Patient has stopped taking simvastatin several months ago so the lab reflects LDLc while patient is on only OTC fish oil 800 mg daily. In the past remember taking atorvastatin 20 mg (2017) tolerated well but LDLc improved so went off of it. Early this year was put on atorvastatin 40 mg unable to tolerate due to sever muscle and joints pain; experienced same type of pain/symptoms from simvastatin 40 mg dose. Eats healthy balance diet and sty active but has not been doing any exercise as she was not feeling from cold. Planing to restart her regular exercise.    Current Medications: fish oil 800 mg daily  Intolerances: simvastatin 40 mg daily, atorvastatin 40 mg  Risk Factors: family hx of hear diseases, age, hypertension, elevated BMI LDL goal: <100 mg/dl  Diet:   Breakfast- boiled eggs, Rasin brans cereals  Lunch: soup and sandwiches (meat grilled or baked) Supper: skip eat snacks - cheese and grapes or crackers and cheese  Eat out once a week  Patient agrees that she should eat more vegetables Exercise:  lately not doing any as getting over common cold. But usually walk on treadmill 30-45 min 3 to 4 times per week  Family History: Mother and mother - heart problems Half brother- pancreatic cancer and heart problem  Younger brother - heart problem    Social History:  Smoking: never (exposure to second hand smoke)  Alcohol: glass of wine once week   Labs: Lipid Panel     Component Value Date/Time   CHOL 196 07/10/2022 0810   TRIG 127 07/10/2022 0810   HDL 50  07/10/2022 0810   CHOLHDL 3.9 07/10/2022 0810   LDLCALC 123 (H) 07/10/2022 0810   LABVLDL 23 07/10/2022 0810    Past Medical History:  Diagnosis Date   Breast cancer ([place]) 2020   Left Breast Cancer   CKD (chronic kidney disease)    Gout    Hyperlipidemia    Hypertension    Hypothyroidism    Megaloblastic anemia    Obesity    Personal history of radiation therapy 2020   Left Breast Cancer   PONV (postoperative nausea and vomiting)    SCC (squamous cell carcinoma) Keratoacanthoma 10/10/2016   Right Shin (Cx3,5FU)   Squamous cell carcinoma in situ (SCCIS) 11/15/2016   Right Lower Shin (tx p bx)   Varicose veins     Current Outpatient Medications on File Prior to Visit  Medication Sig Dispense Refill   acetaminophen (TYLENOL) 650 MG CR tablet Take 1,300 mg by mouth 2 (two) times daily as needed for pain.     allopurinol (ZYLOPRIM) 300 MG tablet Take 300 mg by mouth every morning.     aspirin EC 81 MG tablet Take 1 tablet (81 mg total) by mouth daily. Swallow whole. (Patient taking differently: Take 81 mg by mouth every morning. Swallow whole.) 90 tablet 3   B Complex-C (SUPER B  COMPLEX PO) Take 1 tablet by mouth every morning.     Biotin 10000 MCG TABS Take 10,000 mcg by mouth every morning.     carvedilol (COREG) 6.25 MG tablet Take 6.25 mg by mouth 2 (two) times daily.     cholecalciferol (VITAMIN D3) 25 MCG (1000 UT) tablet Take 1,000 Units by mouth every morning.     colchicine 0.6 MG tablet TAKE 1 TABLET BY MOUTH EVERY DAY (Patient taking differently: Take 0.6 mg by mouth daily as needed (gout attacks).) 90 tablet 1   furosemide (LASIX) 20 MG tablet Take 20 mg by mouth 2 (two) times daily with breakfast and lunch.     hydrALAZINE (APRESOLINE) 100 MG tablet Take 100 mg by mouth 3 (three) times daily.     levothyroxine (SYNTHROID) 100 MCG tablet Take 100 mcg by mouth daily before breakfast.     liothyronine (CYTOMEL) 5 MCG tablet Take 5 mcg by mouth daily before breakfast.      Omega-3 Fatty Acids (FISH OIL PO) Take 800 mg by mouth every morning.     predniSONE (DELTASONE) 20 MG tablet Take 20 mg by mouth 2 (two) times daily.     pyridOXINE (VITAMIN B-6) 100 MG tablet Take 100 mg by mouth in the morning.     tamoxifen (NOLVADEX) 10 MG tablet Take 1 tablet (10 mg total) by mouth daily. (Patient taking differently: Take 10 mg by mouth every morning.) 90 tablet 3   Vitamin E 670 MG (1000 UT) CAPS Take 1,000 Units by mouth every morning.     No current facility-administered medications on file prior to visit.    Allergies  Allergen Reactions   Accupril [Quinapril Hcl] Cough   Amlodipine Besylate Swelling    Leg swelling   Atacand [Candesartan] Other (See Comments)    Unknown reaction   Atorvastatin Other (See Comments)    Joint pain   Cozaar [Losartan Potassium] Other (See Comments)    Stomach upset    Maxzide [Hydrochlorothiazide W-Triamterene] Other (See Comments)    Unknown reaction   Tiazac [Diltiazem Hcl Er Beads] Other (See Comments)    Unknown reaction   Zestril [Lisinopril] Cough    Hyperlipidemia Assessment: LDLc elevated 123 mg/dl (07/10/2022) goal <100 mg/dl  Patient is unable to tolerate atorvastatin 40 mg and simvastatin 40 mg - severe joint/muscle pain  Patient is in agreement to try some of the hydrophilic statin at low dose   In future may consider adding ezetimibe if  statins tolerated well and unable to meet the target LDLc Plan: Start taking rosuvastatin 10 mg daily if unable to tolerate take 10 mg every other day or take 5 mg daily  Continue taking fish oil 800 mg daily Will repeat lipid lab in 12 weeks   Thank you,  Cammy Copa, Pharm.D [facility] of [facility] [address], [place], [place] [address]  Phone: [phone]; Fax: [phone]

## 2022-08-27 ENCOUNTER — Ambulatory Visit: Payer: Medicare Other | Attending: Internal Medicine | Admitting: Student

## 2022-08-27 DIAGNOSIS — E7800 Pure hypercholesterolemia, unspecified: Secondary | ICD-10-CM | POA: Diagnosis not present

## 2022-08-27 DIAGNOSIS — E785 Hyperlipidemia, unspecified: Secondary | ICD-10-CM

## 2022-08-27 DIAGNOSIS — J40 Bronchitis, not specified as acute or chronic: Secondary | ICD-10-CM | POA: Diagnosis not present

## 2022-08-27 DIAGNOSIS — H6993 Unspecified Eustachian tube disorder, bilateral: Secondary | ICD-10-CM | POA: Diagnosis not present

## 2022-08-27 DIAGNOSIS — J329 Chronic sinusitis, unspecified: Secondary | ICD-10-CM | POA: Diagnosis not present

## 2022-08-27 DIAGNOSIS — J029 Acute pharyngitis, unspecified: Secondary | ICD-10-CM | POA: Diagnosis not present

## 2022-08-27 MED ORDER — ROSUVASTATIN CALCIUM 10 MG PO TABS: 10.0000 mg | ORAL_TABLET | Freq: Every day | ORAL | 3 refills | Status: DC

## 2022-08-27 NOTE — Assessment & Plan Note (Addendum)
Assessment: LDLc elevated 123 mg/dl (07/10/2022) goal <100 mg/dl  Patient is unable to tolerate atorvastatin 40 mg and simvastatin 40 mg - severe joint/muscle pain  Patient is in agreement to try some of the hydrophilic statin at low dose   In future may consider adding ezetimibe if  statins tolerated well and unable to meet the target LDLc Plan: Start taking rosuvastatin 10 mg daily if unable to tolerate take 10 mg every other day or take 5 mg daily  Continue taking fish oil 800 mg daily Will repeat lipid lab in 12 weeks

## 2022-08-27 NOTE — Patient Instructions (Addendum)
Changes made by your pharmacist Cammy Copa, PharmD at today's visit:    Instructions/Changes  (what do you need to do) Your Notes  (what you did and when you did it)  1. Continue taking fish oil daily   2. Start taking rosuvastatin 10 mg daily    3. Continue exercising 30-45 min 3 to 4 times per week      If you have any questions or concerns please use My Chart to send questions or call the office at [phone]

## 2022-09-06 DIAGNOSIS — R059 Cough, unspecified: Secondary | ICD-10-CM | POA: Diagnosis not present

## 2022-09-06 DIAGNOSIS — E782 Mixed hyperlipidemia: Secondary | ICD-10-CM | POA: Diagnosis not present

## 2022-09-06 DIAGNOSIS — E039 Hypothyroidism, unspecified: Secondary | ICD-10-CM | POA: Diagnosis not present

## 2022-09-06 DIAGNOSIS — I10 Essential (primary) hypertension: Secondary | ICD-10-CM | POA: Diagnosis not present

## 2022-09-06 DIAGNOSIS — R5381 Other malaise: Secondary | ICD-10-CM | POA: Diagnosis not present

## 2022-09-06 DIAGNOSIS — D531 Other megaloblastic anemias, not elsewhere classified: Secondary | ICD-10-CM | POA: Diagnosis not present

## 2022-09-08 NOTE — Progress Notes (Incomplete)
Patient Care Team: Lawerance Cruel, MD as PCP - General (Family Medicine) Nicholas Lose, MD as Consulting Physician (Hematology and Oncology) Kyung Rudd, MD as Consulting Physician (Radiation Oncology) Jovita Kussmaul, MD as Consulting Physician (General Surgery)  DIAGNOSIS: No diagnosis found.  SUMMARY OF ONCOLOGIC HISTORY: Oncology History  Malignant neoplasm of lower-outer quadrant of left breast of female, estrogen receptor positive ([place])  04/03/2019 Cancer Staging   Staging form: Breast, AJCC 8th Edition - Clinical stage from 04/03/2019: Stage IA (cT1a, cN0, cM0, G2, ER+, PR+, HER2-) - Signed by Gardenia Phlegm, NP on 04/15/2019   04/15/2019 Initial Diagnosis   Screening mammogram detected 0.4cm mass in the left breast at the 6 o'clock position with no left axillary adenopathy. Biopsy on 04/03/19 showed IDC, grade 1-2, HER-2 - (0), ER +95%, PR+ 95%, Ki67 15%.    04/30/2019 Surgery   Left lumpectomy Marlou Starks): IDC, grade 1, 0.8cm, intermediate grade DCIS, lymphovascular invasion present, clear margins. Three left axillary lymph nodes negative for carcinoma.    06/02/2019 -  Radiation Therapy   Adjuvant radiation   08/2019 -  Anti-estrogen oral therapy   Anastrozole daily stopped 10/2019 due to severe joint pain; switched to tamoxifen 52m on 01/11/20   10/06/2021 Genetic Testing   Negative hereditary cancer genetic testing: no pathogenic variants detected in [facility] CancerNext-Expanded +RNAinsight Panel.  Variants of uncertain significance detected in TSC1 at  p.L439V (c.1315C>G) and in TSC2 at p.D624N (c.1870G>A).  The report date is 10/06/2021.   The CancerNext[facility]Expanded gene panel [facility] includes sequencing, rearrangement, and RNA analysis for the following 77 genes: AIP, ALK, APC, ATM, AXIN2, BAP1, BARD1, BLM, BMPR1A, BRCA1, BRCA2, BRIP1, CDC73, CDH1, CDK4, CDKN1B, CDKN2A, CHEK2, CTNNA1, DICER1, FANCC, FH, FLCN, GALNT12, KIF1B, LZTR1, MAX, MEN1, MET, MLH1,  MSH2, MSH3, MSH6, MUTYH, NBN, NF1, NF2, NTHL1, PALB2, PHOX2B, PMS2, POT1, PRKAR1A, PTCH1, PTEN, RAD51C, RAD51D, RB1, RECQL, RET, SDHA, SDHAF2, SDHB, SDHC, SDHD, SMAD4, SMARCA4, SMARCB1, SMARCE1, STK11, SUFU, TMEM127, TP53, TSC1, TSC2, VHL and XRCC2 (sequencing and deletion/duplication); EGFR, EGLN1, HOXB13, KIT, MITF, PDGFRA, POLD1, and POLE (sequencing only); EPCAM and GREM1 (deletion/duplication only).      CHIEF COMPLIANT: Follow-up of right breast cancer on tamoxifen   INTERVAL HISTORY: Karen Mills a 66y.o. with above-mentioned history of right breast cancer who underwent a lumpectomy, radiation, and is currently on antiestrogen therapy with tamoxifen. Mammogram on 03/30/2021 showed two new groups of calcifications, 2 mm apart, in the left breast. Biopsy on 03/31/2021 showed no evidence of malignancy. She presents to the clinic today for follow-up.     ALLERGIES:  is allergic to accupril [quinapril hcl], amlodipine besylate, atacand [candesartan], atorvastatin, cozaar [losartan potassium], maxzide [hydrochlorothiazide w-triamterene], tiazac [diltiazem hcl er beads], and zestril [lisinopril].  MEDICATIONS:  Current Outpatient Medications  Medication Sig Dispense Refill   acetaminophen (TYLENOL) 650 MG CR tablet Take 1,300 mg by mouth 2 (two) times daily as needed for pain.     allopurinol (ZYLOPRIM) 300 MG tablet Take 300 mg by mouth every morning.     aspirin EC 81 MG tablet Take 1 tablet (81 mg total) by mouth daily. Swallow whole. (Patient taking differently: Take 81 mg by mouth every morning. Swallow whole.) 90 tablet 3   B Complex-C (SUPER B COMPLEX PO) Take 1 tablet by mouth every morning.     Biotin 10000 MCG TABS Take 10,000 mcg by mouth every morning.     carvedilol (COREG) 6.25 MG tablet Take 6.25 mg by  mouth 2 (two) times daily.     cholecalciferol (VITAMIN D3) 25 MCG (1000 UT) tablet Take 1,000 Units by mouth every morning.     colchicine 0.6 MG tablet TAKE 1 TABLET BY  MOUTH EVERY DAY (Patient taking differently: Take 0.6 mg by mouth daily as needed (gout attacks).) 90 tablet 1   furosemide (LASIX) 20 MG tablet Take 20 mg by mouth 2 (two) times daily with breakfast and lunch.     hydrALAZINE (APRESOLINE) 100 MG tablet Take 100 mg by mouth 3 (three) times daily.     levothyroxine (SYNTHROID) 100 MCG tablet Take 100 mcg by mouth daily before breakfast.     liothyronine (CYTOMEL) 5 MCG tablet Take 5 mcg by mouth daily before breakfast.     Omega-3 Fatty Acids (FISH OIL PO) Take 800 mg by mouth every morning.     predniSONE (DELTASONE) 20 MG tablet Take 20 mg by mouth 2 (two) times daily.     pyridOXINE (VITAMIN B-6) 100 MG tablet Take 100 mg by mouth in the morning.     rosuvastatin (CRESTOR) 10 MG tablet Take 1 tablet (10 mg total) by mouth daily. 90 tablet 3   tamoxifen (NOLVADEX) 10 MG tablet Take 1 tablet (10 mg total) by mouth daily. (Patient taking differently: Take 10 mg by mouth every morning.) 90 tablet 3   Vitamin E 670 MG (1000 UT) CAPS Take 1,000 Units by mouth every morning.     No current facility-administered medications for this visit.    PHYSICAL EXAMINATION: ECOG PERFORMANCE STATUS: {CHL ONC ECOG PS:[id number]}  There were no vitals filed for this visit. There were no vitals filed for this visit.  BREAST:*** No palpable masses or nodules in either right or left breasts. No palpable axillary supraclavicular or infraclavicular adenopathy no breast tenderness or nipple discharge. (exam performed in the presence of a chaperone)  LABORATORY DATA:  I have reviewed the data as listed    Latest Ref Rng & Units 12/26/2021    9:29 AM 12/04/2021    9:59 AM 12/02/2021    3:39 AM  CMP  Glucose 70 - 99 mg/dL  145  146   BUN 8 - 23 mg/dL  24  56   Creatinine 0.44 - 1.00 mg/dL  1.58  1.66   Sodium 135 - 145 mmol/L  137  137   Potassium 3.5 - 5.1 mmol/L  2.7  3.5   Chloride 98 - 111 mmol/L  107  110   CO2 22 - 32 mmol/L  22  17   Calcium 8.9 - 10.3  mg/dL  7.6  7.5   ALT 0 - 32 IU/L 15       Lab Results  Component Value Date   WBC 9.3 12/04/2021   HGB 7.1 (L) 12/04/2021   HCT 20.8 (L) 12/04/2021   MCV 98.6 12/04/2021   PLT 257 12/04/2021   NEUTROABS 6.9 12/04/2021    ASSESSMENT & PLAN:  No problem-specific Assessment & Plan notes found for this encounter.    No orders of the defined types were placed in this encounter.  The patient has a good understanding of the overall plan. she agrees with it. she will call with any problems that may develop before the next visit here. Total time spent: 30 mins including face to face time and time spent for planning, charting and co-ordination of care   Suzzette Righter, [place] 09/08/22    I Gardiner Coins am scribing for Dr. Lindi Adie  ***

## 2022-09-13 ENCOUNTER — Ambulatory Visit: Payer: Medicare Other | Admitting: Hematology and Oncology

## 2022-09-20 ENCOUNTER — Ambulatory Visit: Payer: Medicare Other | Admitting: Hematology and Oncology

## 2022-09-26 NOTE — Progress Notes (Signed)
Patient Care Team: Lawerance Cruel, MD as PCP - General (Family Medicine) Nicholas Lose, MD as Consulting Physician (Hematology and Oncology) Kyung Rudd, MD as Consulting Physician (Radiation Oncology) Jovita Kussmaul, MD as Consulting Physician (General Surgery)  DIAGNOSIS: No diagnosis found.  SUMMARY OF ONCOLOGIC HISTORY: Oncology History  Malignant neoplasm of lower-outer quadrant of left breast of female, estrogen receptor positive ([place])  04/03/2019 Cancer Staging   Staging form: Breast, AJCC 8th Edition - Clinical stage from 04/03/2019: Stage IA (cT1a, cN0, cM0, G2, ER+, PR+, HER2-) - Signed by Gardenia Phlegm, NP on 04/15/2019   04/15/2019 Initial Diagnosis   Screening mammogram detected 0.4cm mass in the left breast at the 6 o'clock position with no left axillary adenopathy. Biopsy on 04/03/19 showed IDC, grade 1-2, HER-2 - (0), ER +95%, PR+ 95%, Ki67 15%.    04/30/2019 Surgery   Left lumpectomy Marlou Starks): IDC, grade 1, 0.8cm, intermediate grade DCIS, lymphovascular invasion present, clear margins. Three left axillary lymph nodes negative for carcinoma.    06/02/2019 -  Radiation Therapy   Adjuvant radiation   08/2019 -  Anti-estrogen oral therapy   Anastrozole daily stopped 10/2019 due to severe joint pain; switched to tamoxifen 59m on 01/11/20   10/06/2021 Genetic Testing   Negative hereditary cancer genetic testing: no pathogenic variants detected in [facility] CancerNext-Expanded +RNAinsight Panel.  Variants of uncertain significance detected in TSC1 at  p.L439V (c.1315C>G) and in TSC2 at p.D624N (c.1870G>A).  The report date is 10/06/2021.   The [facility] includes sequencing, rearrangement, and RNA analysis for the following 77 genes: AIP, ALK, APC, ATM, AXIN2, BAP1, BARD1, BLM, BMPR1A, BRCA1, BRCA2, BRIP1, CDC73, CDH1, CDK4, CDKN1B, CDKN2A, CHEK2, CTNNA1, DICER1, FANCC, FH, FLCN, GALNT12, KIF1B, LZTR1, MAX, MEN1, MET, MLH1,  MSH2, MSH3, MSH6, MUTYH, NBN, NF1, NF2, NTHL1, PALB2, PHOX2B, PMS2, POT1, PRKAR1A, PTCH1, PTEN, RAD51C, RAD51D, RB1, RECQL, RET, SDHA, SDHAF2, SDHB, SDHC, SDHD, SMAD4, SMARCA4, SMARCB1, SMARCE1, STK11, SUFU, TMEM127, TP53, TSC1, TSC2, VHL and XRCC2 (sequencing and deletion/duplication); EGFR, EGLN1, HOXB13, KIT, MITF, PDGFRA, POLD1, and POLE (sequencing only); EPCAM and GREM1 (deletion/duplication only).      CHIEF COMPLIANT:   INTERVAL HISTORY: VPALLAS WAHLERTis a   ALLERGIES:  is allergic to accupril [quinapril hcl], amlodipine besylate, atacand [candesartan], atorvastatin, cozaar [losartan potassium], maxzide [hydrochlorothiazide w-triamterene], tiazac [diltiazem hcl er beads], and zestril [lisinopril].  MEDICATIONS:  Current Outpatient Medications  Medication Sig Dispense Refill   acetaminophen (TYLENOL) 650 MG CR tablet Take 1,300 mg by mouth 2 (two) times daily as needed for pain.     allopurinol (ZYLOPRIM) 300 MG tablet Take 300 mg by mouth every morning.     aspirin EC 81 MG tablet Take 1 tablet (81 mg total) by mouth daily. Swallow whole. (Patient taking differently: Take 81 mg by mouth every morning. Swallow whole.) 90 tablet 3   B Complex-C (SUPER B COMPLEX PO) Take 1 tablet by mouth every morning.     Biotin 10000 MCG TABS Take 10,000 mcg by mouth every morning.     carvedilol (COREG) 6.25 MG tablet Take 6.25 mg by mouth 2 (two) times daily.     cholecalciferol (VITAMIN D3) 25 MCG (1000 UT) tablet Take 1,000 Units by mouth every morning.     colchicine 0.6 MG tablet TAKE 1 TABLET BY MOUTH EVERY DAY (Patient taking differently: Take 0.6 mg by mouth daily as needed (gout attacks).) 90 tablet 1   furosemide (LASIX) 20 MG tablet Take  20 mg by mouth 2 (two) times daily with breakfast and lunch.     hydrALAZINE (APRESOLINE) 100 MG tablet Take 100 mg by mouth 3 (three) times daily.     levothyroxine (SYNTHROID) 100 MCG tablet Take 100 mcg by mouth daily before breakfast.      liothyronine (CYTOMEL) 5 MCG tablet Take 5 mcg by mouth daily before breakfast.     Omega-3 Fatty Acids (FISH OIL PO) Take 800 mg by mouth every morning.     predniSONE (DELTASONE) 20 MG tablet Take 20 mg by mouth 2 (two) times daily.     pyridOXINE (VITAMIN B-6) 100 MG tablet Take 100 mg by mouth in the morning.     rosuvastatin (CRESTOR) 10 MG tablet Take 1 tablet (10 mg total) by mouth daily. 90 tablet 3   tamoxifen (NOLVADEX) 10 MG tablet Take 1 tablet (10 mg total) by mouth daily. (Patient taking differently: Take 10 mg by mouth every morning.) 90 tablet 3   Vitamin E 670 MG (1000 UT) CAPS Take 1,000 Units by mouth every morning.     No current facility-administered medications for this visit.    PHYSICAL EXAMINATION: ECOG PERFORMANCE STATUS: {CHL ONC ECOG PS:[id number]}  There were no vitals filed for this visit. There were no vitals filed for this visit.  BREAST:*** No palpable masses or nodules in either right or left breasts. No palpable axillary supraclavicular or infraclavicular adenopathy no breast tenderness or nipple discharge. (exam performed in the presence of a chaperone)  LABORATORY DATA:  I have reviewed the data as listed    Latest Ref Rng & Units 12/26/2021    9:29 AM 12/04/2021    9:59 AM 12/02/2021    3:39 AM  CMP  Glucose 70 - 99 mg/dL  145  146   BUN 8 - 23 mg/dL  24  56   Creatinine 0.44 - 1.00 mg/dL  1.58  1.66   Sodium 135 - 145 mmol/L  137  137   Potassium 3.5 - 5.1 mmol/L  2.7  3.5   Chloride 98 - 111 mmol/L  107  110   CO2 22 - 32 mmol/L  22  17   Calcium 8.9 - 10.3 mg/dL  7.6  7.5   ALT 0 - 32 IU/L 15       Lab Results  Component Value Date   WBC 9.3 12/04/2021   HGB 7.1 (L) 12/04/2021   HCT 20.8 (L) 12/04/2021   MCV 98.6 12/04/2021   PLT 257 12/04/2021   NEUTROABS 6.9 12/04/2021    ASSESSMENT & PLAN:  No problem-specific Assessment & Plan notes found for this encounter.    No orders of the defined types were placed in this  encounter.  The patient has a good understanding of the overall plan. she agrees with it. she will call with any problems that may develop before the next visit here. Total time spent: 30 mins including face to face time and time spent for planning, charting and co-ordination of care   Suzzette Righter, [place] 09/26/22    I Gardiner Coins am acting as a Education administrator for [employer]  ***

## 2022-09-27 ENCOUNTER — Inpatient Hospital Stay: Payer: Medicare Other | Attending: Hematology and Oncology | Admitting: Hematology and Oncology

## 2022-09-27 VITALS — BP 143/69 | HR 75 | Temp 97.8°F | Resp 18 | Ht 70.0 in | Wt 202.3 lb

## 2022-09-27 DIAGNOSIS — L659 Nonscarring hair loss, unspecified: Secondary | ICD-10-CM | POA: Insufficient documentation

## 2022-09-27 DIAGNOSIS — C50512 Malignant neoplasm of lower-outer quadrant of left female breast: Secondary | ICD-10-CM | POA: Insufficient documentation

## 2022-09-27 DIAGNOSIS — Z170 Estrogen receptor positive status [ER+]: Secondary | ICD-10-CM | POA: Diagnosis not present

## 2022-09-27 DIAGNOSIS — Z79811 Long term (current) use of aromatase inhibitors: Secondary | ICD-10-CM | POA: Diagnosis not present

## 2022-09-27 DIAGNOSIS — N951 Menopausal and female climacteric states: Secondary | ICD-10-CM | POA: Diagnosis not present

## 2022-09-27 DIAGNOSIS — Z79899 Other long term (current) drug therapy: Secondary | ICD-10-CM | POA: Diagnosis not present

## 2022-09-27 NOTE — Assessment & Plan Note (Signed)
04/15/2019:Screening mammogram detected 0.4cm mass in the left breast at the 6 o'clock position with no left axillary adenopathy. Biopsy on 04/03/19 showed IDC, grade 1-2, HER-2 - (0), ER +95%, PR+ 95%, Ki67 15%.  T1 a N0 stage Ia clinical stage   Treatment plan: 1.  Breast conserving surgery with sentinel lymph node biopsy 04/30/2019: Grade 1 IDC 0.8 cm, 0/3 lymph nodes, ER 95%, PR 95%, KI 6715%, HER-2 negative, T1BN0 stage Ia 2.  Adjuvant radiation therapy 06/02/2019- 3.  Follow-up adjuvant antiestrogen therapy with anastrozole 1 mg daily x5 years -------------------------------------------------------------------------------------------------------------------- Current treatment: Adjuvant antiestrogen therapy with anastrozole 1 mg daily started 07/09/2019 (discontinued due to severe joint aches and pains) switched to tamoxifen 10 mg started 01/11/2020 .   Tamoxifen toxicities:  Very occasional hot flashes. I discussed with her about long distance car rides and risk of blood clots with tamoxifen.   Hair loss: Unclear etiology   Breast Cancer Surveillance: 1. Mammogram 04/04/2022: Left breast calcifications: Biopsy: Fat necrosis breast density category B, 32-monthfollow-up recommended 2. Breast exam 09/27/2022: Benign Bone density 10/14/2019: T score -0.1: Normal     Return to clinic once a year for follow-up

## 2022-10-09 DIAGNOSIS — R040 Epistaxis: Secondary | ICD-10-CM | POA: Diagnosis not present

## 2022-10-09 DIAGNOSIS — H903 Sensorineural hearing loss, bilateral: Secondary | ICD-10-CM | POA: Diagnosis not present

## 2022-10-29 DIAGNOSIS — H903 Sensorineural hearing loss, bilateral: Secondary | ICD-10-CM | POA: Diagnosis not present

## 2022-10-29 DIAGNOSIS — J342 Deviated nasal septum: Secondary | ICD-10-CM | POA: Diagnosis not present

## 2022-10-29 DIAGNOSIS — J343 Hypertrophy of nasal turbinates: Secondary | ICD-10-CM | POA: Diagnosis not present

## 2022-10-29 DIAGNOSIS — J310 Chronic rhinitis: Secondary | ICD-10-CM | POA: Diagnosis not present

## 2022-10-30 DIAGNOSIS — Z0000 Encounter for general adult medical examination without abnormal findings: Secondary | ICD-10-CM | POA: Diagnosis not present

## 2022-10-30 DIAGNOSIS — I10 Essential (primary) hypertension: Secondary | ICD-10-CM | POA: Diagnosis not present

## 2022-10-30 DIAGNOSIS — E46 Unspecified protein-calorie malnutrition: Secondary | ICD-10-CM | POA: Diagnosis not present

## 2022-10-30 DIAGNOSIS — N1832 Chronic kidney disease, stage 3b: Secondary | ICD-10-CM | POA: Diagnosis not present

## 2022-10-30 DIAGNOSIS — M109 Gout, unspecified: Secondary | ICD-10-CM | POA: Diagnosis not present

## 2022-11-01 NOTE — Progress Notes (Signed)
Cardiology Office Note:    Date:  11/02/2022   ID:  LELIANA KONTZ, DOB Apr 11, 1956, MRN [id number]  PCP:  Lawerance Cruel, MD   [place] Providers Cardiologist:  Lenna Sciara, MD Referring MD: Lawerance Cruel, MD   Chief Complaint/Reason for Referral: Cardiology follow-up  ASSESSMENT:    1. Coronary artery disease involving native coronary artery of native heart without angina pectoris   2. Aortic atherosclerosis ([place])   3. Hyperlipidemia LDL goal <70   4. Primary hypertension   5. Stage 3b chronic kidney disease ([place])   6. Fatigue, unspecified type     PLAN:    In order of problems listed above: 1.  Coronary artery disease: Continue Crestor.  Will defer aspirin for now until she sees hematology 2.  Aortic atherosclerosis: Continue Crestor.  Will defer aspirin for now until she sees hematology. 3.  Hyperlipidemia: Will check lipid panel, LFTs, and LP(a) today.  The patient will be seeing pharmacy in February. 4.  Hypertension: Blood pressure is well-controlled on her current regimen. 5.  Chronic kidney disease: Monitor for now.  She is intolerant of ACE or ARB medications for renal protection. 6.  Fatigue: Will check CBC today; the patient will be seeing hematology.             Dispo:  Return in about 6 months (around 05/03/2023).      Medication Adjustments/Labs and Tests Ordered: Current medicines are reviewed at length with the patient today.  Concerns regarding medicines are outlined above.  The following changes have been made:  no change   Labs/tests ordered: Orders Placed This Encounter  Procedures   Lipoprotein A (LPA)   Lipid panel   Hepatic function panel   CBC   EKG 12-Lead    Medication Changes: No orders of the defined types were placed in this encounter.    Current medicines are reviewed at length with the patient today.  The patient does not have concerns regarding medicines.   History of Present Illness:    FOCUSED  PROBLEM LIST:   1.  Elevated calcium score on CAC CT 2023 2.  Aortic atherosclerosis on CAC CT 2023 3.  Hypertension; intolerant of amlodipine, candesartan, losartan, Maxide, and ACE inhibitors (cough) 4.  Hyperlipidemia 5.  Hypothyroidism 6.  Breast cancer status post lymphatic Mi, radiation, and antiestrogen oral therapy 7.  Severe epistaxis requiring IR embolization February 2023 8.  Chronic kidney disease stage III 9.  ACE inhibitor associated cough  January 2023 consultation:  The patient is a 67 y.o. female with the indicated medical history here for recommendations regarding an incidentally noted murmur and an abnormal lipid panel.  Patient was seen by her primary care provider recently was doing relatively well.  A murmur was noted.  She is well.  She denies any chest pain, palpitations, paroxysmal nocturnal dyspnea, orthopnea, or exertional dyspnea.  She has required no hospitalizations or emergency room visits.  She is primarily concerned about whether her cardiovascular health is good and whether she needs her hyperlipidemia treated.  Plan: Obtain echocardiogram and calcium score CT.  Today: In the interim the patient had a calcium score which was elevated and also showed aortic atherosclerosis.  She was started on aspirin and atorvastatin.  An echocardiogram was reassuring.  She ended up presenting to [facility] February with epistaxis that required embolization of bilateral IMA branches and left facial arteries.  The patient denies chest pain.  She has been more fatigued of  late and has been referred to hematology.  Her blood pressure is under good control and her hydralazine was decreased to twice a day by her PCP.  She is tolerating her Crestor well without any issues.  She remains off her aspirin until she sees hematology.  She has required no emergency room visits or hospitalizations for cardiovascular issues.         Current Medications: Current Meds  Medication  Sig   allopurinol (ZYLOPRIM) 300 MG tablet Take 300 mg by mouth every morning.   Biotin 10000 MCG TABS Take 10,000 mcg by mouth every morning.   carvedilol (COREG) 6.25 MG tablet Take 6.25 mg by mouth 2 (two) times daily.   cholecalciferol (VITAMIN D3) 25 MCG (1000 UT) tablet Take 1,000 Units by mouth every morning.   furosemide (LASIX) 20 MG tablet Take 20 mg by mouth 2 (two) times daily with breakfast and lunch.   hydrALAZINE (APRESOLINE) 100 MG tablet Take 100 mg by mouth 2 (two) times daily.   levothyroxine (SYNTHROID) 100 MCG tablet Take 100 mcg by mouth daily before breakfast.   Omega-3 Fatty Acids (FISH OIL PO) Take 800 mg by mouth every morning.   pyridOXINE (VITAMIN B-6) 100 MG tablet Take 100 mg by mouth in the morning.   rosuvastatin (CRESTOR) 10 MG tablet Take 1 tablet (10 mg total) by mouth daily.   Vitamin E 670 MG (1000 UT) CAPS Take 1,000 Units by mouth every morning.     Allergies:    Accupril [quinapril hcl], Amlodipine besylate, Atacand [candesartan], Atorvastatin, Cozaar [losartan potassium], Maxzide [hydrochlorothiazide w-triamterene], Tiazac [diltiazem hcl er beads], and Zestril [lisinopril]   Social History:   Social History   Tobacco Use   Smoking status: Never   Smokeless tobacco: Never  Vaping Use   Vaping Use: Never used  Substance Use Topics   Alcohol use: Yes    Comment: 2-3x per week   Drug use: No     Family Hx: Family History  Problem Relation Age of Onset   Heart disease Mother    Heart disease Father    Heart disease Brother    Pancreatic cancer Half-Brother        d. early 57s     Review of Systems:   Please see the history of present illness.    All other systems reviewed and are negative.     EKGs/Labs/Other Test Reviewed:    EKG:  EKG performed today that I personally reviewed demonstrates normal sinus rhythm.  Prior CV studies:  TTE 2023:  1. Left ventricular ejection fraction by 3D volume is 63 %. The left  ventricle has  normal function. The left ventricle has no regional wall  motion abnormalities. There is mild concentric left ventricular  hypertrophy. Left ventricular diastolic  parameters are consistent with Grade I diastolic dysfunction (impaired  relaxation). The average left ventricular global longitudinal strain is  -25.7 %. The global longitudinal strain is normal.   2. Right ventricular systolic function is normal. The right ventricular  size is normal.   3. Left atrial size was mildly dilated.   4. The mitral valve is normal in structure. No evidence of mitral valve  regurgitation. No evidence of mitral stenosis.   5. The aortic valve is normal in structure. Aortic valve regurgitation is  trivial. Aortic valve sclerosis is present, with no evidence of aortic  valve stenosis.   6. The inferior vena cava is normal in size with greater than 50%  respiratory variability, suggesting  right atrial pressure of 3 mmHg.   Calcium score CT 2023: Coronary calcium score of 342. This was 92nd percentile for age-, race-, and sex-matched controls. Aortic atherosclerosis.  Other studies Reviewed: Review of the additional studies/records demonstrates: None relevant  Recent Labs: 12/01/2021: B Natriuretic Peptide 63.2 12/04/2021: BUN 24; Creatinine, Ser 1.58; Hemoglobin 7.1; Platelets 257; Potassium 2.7; Sodium 137 12/26/2021: ALT 15   Recent Lipid Panel Lab Results  Component Value Date/Time   CHOL 196 07/10/2022 08:10 AM   TRIG 127 07/10/2022 08:10 AM   HDL 50 07/10/2022 08:10 AM   LDLCALC 123 (H) 07/10/2022 08:10 AM    Risk Assessment/Calculations:                Physical Exam:    VS:  BP 138/62   Pulse 70   Ht '5\' 8"'$  (1.727 m)   Wt 198 lb 12.8 oz (90.2 kg)   SpO2 99%   BMI 30.23 kg/m    Wt Readings from Last 3 Encounters:  11/02/22 198 lb 12.8 oz (90.2 kg)  09/27/22 202 lb 4.8 oz (91.8 kg)  12/01/21 196 lb 3.4 oz (89 kg)    GENERAL:  No apparent distress, AOx3 HEENT:  No carotid  bruits, +2 carotid impulses, no scleral icterus CAR: RRR soft systolic murmur without gallops, rubs, or thrills RES:  Clear to auscultation bilaterally ABD:  Soft, nontender, nondistended, positive bowel sounds x 4 VASC:  +2 radial pulses, +2 carotid pulses, palpable pedal pulses NEURO:  CN 2-12 grossly intact; motor and sensory grossly intact PSYCH:  No active depression or anxiety EXT:  No edema, ecchymosis, or cyanosis  Signed, Early Osmond, MD  11/02/2022 11:00 AM    [address], [place], [place]  [id number] Phone: [phone]; Fax: [phone]   Note:  This document was prepared using Dragon voice recognition software and may include unintentional dictation errors.

## 2022-11-02 ENCOUNTER — Encounter: Payer: Self-pay | Admitting: Internal Medicine

## 2022-11-02 ENCOUNTER — Ambulatory Visit: Payer: Medicare Other | Attending: Internal Medicine | Admitting: Internal Medicine

## 2022-11-02 VITALS — BP 138/62 | HR 70 | Ht 68.0 in | Wt 198.8 lb

## 2022-11-02 DIAGNOSIS — I700 Atherosclerosis of aorta: Secondary | ICD-10-CM | POA: Diagnosis not present

## 2022-11-02 DIAGNOSIS — E785 Hyperlipidemia, unspecified: Secondary | ICD-10-CM

## 2022-11-02 DIAGNOSIS — I10 Essential (primary) hypertension: Secondary | ICD-10-CM | POA: Diagnosis not present

## 2022-11-02 DIAGNOSIS — R5383 Other fatigue: Secondary | ICD-10-CM

## 2022-11-02 DIAGNOSIS — N1832 Chronic kidney disease, stage 3b: Secondary | ICD-10-CM

## 2022-11-02 DIAGNOSIS — I2510 Atherosclerotic heart disease of native coronary artery without angina pectoris: Secondary | ICD-10-CM | POA: Diagnosis not present

## 2022-11-02 LAB — CBC

## 2022-11-02 NOTE — Patient Instructions (Signed)
Medication Instructions:  No changes *If you need a refill on your cardiac medications before your next appointment, please call your pharmacy*   Lab Work: Today: lipids/liver/Lp(a)/CBC  If you have labs (blood work) drawn today and your tests are completely normal, you will receive your results only by: [place] (if you have MyChart) OR A paper copy in the mail If you have any lab test that is abnormal or we need to change your treatment, we will call you to review the results.   Testing/Procedures: none   Follow-Up: At [facility], you and your health needs are our priority.  As part of our continuing mission to provide you with exceptional heart care, we have created designated Provider Care Teams.  These Care Teams include your primary Cardiologist (physician) and Advanced Practice Providers (APPs -  Physician Assistants and Nurse Practitioners) who all work together to provide you with the care you need, when you need it.   Your next appointment:   6 month(s)  Provider:   Advanced Practice Provider (PA-C or NP)

## 2022-11-03 LAB — CBC
Hematocrit: 28.4 % — ABNORMAL LOW (ref 34.0–46.6)
Hemoglobin: 9.5 g/dL — ABNORMAL LOW (ref 11.1–15.9)
MCH: 34.2 pg — ABNORMAL HIGH (ref 26.6–33.0)
MCHC: 33.5 g/dL (ref 31.5–35.7)
MCV: 102 fL — ABNORMAL HIGH (ref 79–97)
Platelets: 334 10*3/uL (ref 150–450)
RBC: 2.78 x10E6/uL — ABNORMAL LOW (ref 3.77–5.28)
RDW: 14 % (ref 11.7–15.4)
WBC: 4.2 10*3/uL (ref 3.4–10.8)

## 2022-11-03 LAB — HEPATIC FUNCTION PANEL
ALT: 10 IU/L (ref 0–32)
AST: 19 IU/L (ref 0–40)
Albumin: 3.6 g/dL — ABNORMAL LOW (ref 3.9–4.9)
Alkaline Phosphatase: 94 IU/L (ref 44–121)
Bilirubin Total: 0.4 mg/dL (ref 0.0–1.2)
Bilirubin, Direct: 0.14 mg/dL (ref 0.00–0.40)
Total Protein: 6.7 g/dL (ref 6.0–8.5)

## 2022-11-03 LAB — LIPID PANEL
Chol/HDL Ratio: 4.3 ratio (ref 0.0–4.4)
Cholesterol, Total: 139 mg/dL (ref 100–199)
HDL: 32 mg/dL — ABNORMAL LOW (ref >39)
LDL Chol Calc (NIH): 75 mg/dL (ref 0–99)
Triglycerides: 188 mg/dL — ABNORMAL HIGH (ref 0–149)
VLDL Cholesterol Cal: 32 mg/dL (ref 5–40)

## 2022-11-03 LAB — LIPOPROTEIN A (LPA): Lipoprotein (a): 16.8 nmol/L (ref <75.0)

## 2022-11-05 ENCOUNTER — Telehealth: Payer: Self-pay | Admitting: *Deleted

## 2022-11-05 DIAGNOSIS — H35033 Hypertensive retinopathy, bilateral: Secondary | ICD-10-CM | POA: Diagnosis not present

## 2022-11-05 DIAGNOSIS — H524 Presbyopia: Secondary | ICD-10-CM | POA: Diagnosis not present

## 2022-11-05 MED ORDER — ROSUVASTATIN CALCIUM 20 MG PO TABS: 20.0000 mg | ORAL_TABLET | Freq: Every day | ORAL | 3 refills | Status: AC

## 2022-11-05 NOTE — Telephone Encounter (Signed)
Patient notified of this recommendation via patient portal.  Is scheduled in Feb with PharmD.  Medication list and prescription updated.

## 2022-11-05 NOTE — Telephone Encounter (Signed)
-----  Message from Early Osmond, MD sent at 11/04/2022  9:46 AM EST ----- Increase crestor to '20mg'$ .

## 2022-11-06 ENCOUNTER — Other Ambulatory Visit: Payer: Self-pay | Admitting: *Deleted

## 2022-11-06 ENCOUNTER — Encounter: Payer: Self-pay | Admitting: *Deleted

## 2022-11-06 DIAGNOSIS — Z170 Estrogen receptor positive status [ER+]: Secondary | ICD-10-CM

## 2022-11-06 NOTE — Progress Notes (Unsigned)
Received fax from pt PCP Dr. Harrington Challenger requesting office visit with MD for anemia that is not responding to oral Nu-iron.

## 2022-11-07 ENCOUNTER — Inpatient Hospital Stay: Payer: Medicare Other | Attending: Hematology and Oncology

## 2022-11-07 DIAGNOSIS — C50512 Malignant neoplasm of lower-outer quadrant of left female breast: Secondary | ICD-10-CM | POA: Diagnosis not present

## 2022-11-07 DIAGNOSIS — Z170 Estrogen receptor positive status [ER+]: Secondary | ICD-10-CM | POA: Insufficient documentation

## 2022-11-07 LAB — CBC WITH DIFFERENTIAL (CANCER CENTER ONLY)
Abs Immature Granulocytes: 0.02 10*3/uL (ref 0.00–0.07)
Basophils Absolute: 0 10*3/uL (ref 0.0–0.1)
Basophils Relative: 1 %
Eosinophils Absolute: 0 10*3/uL (ref 0.0–0.5)
Eosinophils Relative: 1 %
HCT: 28.7 % — ABNORMAL LOW (ref 36.0–46.0)
Hemoglobin: 9.6 g/dL — ABNORMAL LOW (ref 12.0–15.0)
Immature Granulocytes: 1 %
Lymphocytes Relative: 16 %
Lymphs Abs: 0.6 10*3/uL — ABNORMAL LOW (ref 0.7–4.0)
MCH: 33.2 pg (ref 26.0–34.0)
MCHC: 33.4 g/dL (ref 30.0–36.0)
MCV: 99.3 fL (ref 80.0–100.0)
Monocytes Absolute: 0.3 10*3/uL (ref 0.1–1.0)
Monocytes Relative: 7 %
Neutro Abs: 3.1 10*3/uL (ref 1.7–7.7)
Neutrophils Relative %: 74 %
Platelet Count: 318 10*3/uL (ref 150–400)
RBC: 2.89 MIL/uL — ABNORMAL LOW (ref 3.87–5.11)
RDW: 14.3 % (ref 11.5–15.5)
WBC Count: 4.1 10*3/uL (ref 4.0–10.5)
nRBC: 0 % (ref 0.0–0.2)

## 2022-11-07 LAB — IRON AND IRON BINDING CAPACITY (CC-WL,HP ONLY)
Iron: 47 ug/dL (ref 28–170)
Saturation Ratios: 18 % (ref 10.4–31.8)
TIBC: 265 ug/dL (ref 250–450)
UIBC: 218 ug/dL (ref 148–442)

## 2022-11-07 LAB — FERRITIN: Ferritin: 209 ng/mL (ref 11–307)

## 2022-11-07 NOTE — Assessment & Plan Note (Signed)
04/15/2019:Screening mammogram detected 0.4cm mass in the left breast at the 6 o'clock position with no left axillary adenopathy. Biopsy on 04/03/19 showed IDC, grade 1-2, HER-2 - (0), ER +95%, PR+ 95%, Ki67 15%.  T1 a N0 stage Ia clinical stage   Treatment plan: 1.  Breast conserving surgery with sentinel lymph node biopsy 04/30/2019: Grade 1 IDC 0.8 cm, 0/3 lymph nodes, ER 95%, PR 95%, KI 6715%, HER-2 negative, T1BN0 stage Ia 2.  Adjuvant radiation therapy 06/02/2019- 3.  Follow-up adjuvant antiestrogen therapy with anastrozole 1 mg daily x5 years -------------------------------------------------------------------------------------------------------------------- Current treatment: Adjuvant antiestrogen therapy with anastrozole 1 mg daily started 07/09/2019 (discontinued due to severe joint aches and pains) switched to tamoxifen 10 mg started 01/11/2020 .   Tamoxifen toxicities:  Hot flashes after she goes to bed almost nightly I discussed with her about long distance car rides and risk of blood clots with tamoxifen.   Hair loss: Unclear etiology.  She stopped tamoxifen briefly but the hair loss and the hot flashes did not improve so she restarted back again.   Emergency room visits for recurrent nosebleeds in February 2023.  She had to undergo embolization to stop the bleeding.   Breast Cancer Surveillance: 1. Mammogram 04/04/2022: Left breast calcifications: Biopsy: Fat necrosis breast density category B, 51-monthfollow-up recommended 2. Breast exam 09/27/2022: Benign Bone density 10/14/2019: T score -0.1: Normal     Return to clinic once a year for follow-up

## 2022-11-08 ENCOUNTER — Inpatient Hospital Stay: Payer: Medicare Other

## 2022-11-08 ENCOUNTER — Inpatient Hospital Stay: Payer: Medicare Other | Attending: Hematology and Oncology | Admitting: Hematology and Oncology

## 2022-11-08 VITALS — BP 181/69 | HR 69 | Temp 97.3°F | Resp 19 | Wt 200.1 lb

## 2022-11-08 DIAGNOSIS — D509 Iron deficiency anemia, unspecified: Secondary | ICD-10-CM | POA: Diagnosis not present

## 2022-11-08 DIAGNOSIS — Z79899 Other long term (current) drug therapy: Secondary | ICD-10-CM | POA: Insufficient documentation

## 2022-11-08 DIAGNOSIS — D649 Anemia, unspecified: Secondary | ICD-10-CM | POA: Diagnosis not present

## 2022-11-08 DIAGNOSIS — Z79620 Long term (current) use of immunosuppressive biologic: Secondary | ICD-10-CM | POA: Diagnosis not present

## 2022-11-08 DIAGNOSIS — C50512 Malignant neoplasm of lower-outer quadrant of left female breast: Secondary | ICD-10-CM | POA: Diagnosis not present

## 2022-11-08 DIAGNOSIS — Z79811 Long term (current) use of aromatase inhibitors: Secondary | ICD-10-CM | POA: Diagnosis not present

## 2022-11-08 DIAGNOSIS — Z170 Estrogen receptor positive status [ER+]: Secondary | ICD-10-CM | POA: Diagnosis not present

## 2022-11-08 LAB — CMP (CANCER CENTER ONLY)
ALT: 12 U/L (ref 0–44)
AST: 20 U/L (ref 15–41)
Albumin: 3.7 g/dL (ref 3.5–5.0)
Alkaline Phosphatase: 93 U/L (ref 38–126)
Anion gap: 7 (ref 5–15)
BUN: 28 mg/dL — ABNORMAL HIGH (ref 8–23)
CO2: 24 mmol/L (ref 22–32)
Calcium: 9.5 mg/dL (ref 8.9–10.3)
Chloride: 100 mmol/L (ref 98–111)
Creatinine: 1.1 mg/dL — ABNORMAL HIGH (ref 0.44–1.00)
GFR, Estimated: 55 mL/min — ABNORMAL LOW (ref >60)
Glucose, Bld: 95 mg/dL (ref 70–99)
Potassium: 4.2 mmol/L (ref 3.5–5.1)
Sodium: 131 mmol/L — ABNORMAL LOW (ref 135–145)
Total Bilirubin: 0.6 mg/dL (ref 0.3–1.2)
Total Protein: 7.6 g/dL (ref 6.5–8.1)

## 2022-11-08 LAB — RETIC PANEL
Immature Retic Fract: 16.1 % — ABNORMAL HIGH (ref 2.3–15.9)
RBC.: 2.88 MIL/uL — ABNORMAL LOW (ref 3.87–5.11)
Retic Count, Absolute: 36 10*3/uL (ref 19.0–186.0)
Retic Ct Pct: 1.3 % (ref 0.4–3.1)
Reticulocyte Hemoglobin: 36.1 pg (ref >27.9)

## 2022-11-08 LAB — DIRECT ANTIGLOBULIN TEST (NOT AT ARMC)
DAT, IgG: NEGATIVE
DAT, complement: NEGATIVE

## 2022-11-08 LAB — VITAMIN B12: Vitamin B-12: 410 pg/mL (ref 180–914)

## 2022-11-08 LAB — FOLATE: Folate: >40 ng/mL (ref >5.9)

## 2022-11-08 NOTE — Assessment & Plan Note (Signed)
Lab Review: 11/07/22: Hb 9.6, MCV 99.3, Ferritin 209, Iron sat: 18%  Differential diagnosis: 1. Anemia due to chronic disease and inflammation 2. anemia due to renal dysfunction 3. Combined B-12 and iron deficiency anemias 4. Hemolysis 5. Hypothyroidism 6. Plasma cell disorders myeloma 7. Bone marrow dysfunction with MDS  Workup performed: 1. CMP to evaluate liver and kidney function 2. Haptoglobin, LDH, reticulocyte count to evaluate hemolysis 3. TSH 4. SPEP 5.  I-73 and folic acid levels  Telephone visit in 1 week to discuss labs and decide if she needs bone marrow biopsy

## 2022-11-08 NOTE — Progress Notes (Signed)
Patient Care Team: Nicholas Lose, MD as PCP - General (Hematology and Oncology) Nicholas Lose, MD as Consulting Physician (Hematology and Oncology) Kyung Rudd, MD as Consulting Physician (Radiation Oncology) Jovita Kussmaul, MD as Consulting Physician (General Surgery)  DIAGNOSIS:  Encounter Diagnoses  Name Primary?   Malignant neoplasm of lower-outer quadrant of left breast of female, estrogen receptor positive ([place]) Yes   Normocytic normochromic anemia     SUMMARY OF ONCOLOGIC HISTORY: Oncology History  Malignant neoplasm of lower-outer quadrant of left breast of female, estrogen receptor positive ([place])  04/03/2019 Cancer Staging   Staging form: Breast, AJCC 8th Edition - Clinical stage from 04/03/2019: Stage IA (cT1a, cN0, cM0, G2, ER+, PR+, HER2-) - Signed by Gardenia Phlegm, NP on 04/15/2019   04/15/2019 Initial Diagnosis   Screening mammogram detected 0.4cm mass in the left breast at the 6 o'clock position with no left axillary adenopathy. Biopsy on 04/03/19 showed IDC, grade 1-2, HER-2 - (0), ER +95%, PR+ 95%, Ki67 15%.    04/30/2019 Surgery   Left lumpectomy Marlou Starks): IDC, grade 1, 0.8cm, intermediate grade DCIS, lymphovascular invasion present, clear margins. Three left axillary lymph nodes negative for carcinoma.    06/02/2019 -  Radiation Therapy   Adjuvant radiation   08/2019 -  Anti-estrogen oral therapy   Anastrozole daily stopped 10/2019 due to severe joint pain; switched to tamoxifen '10mg'$  on 01/11/20   10/06/2021 Genetic Testing   Negative hereditary cancer genetic testing: no pathogenic variants detected in [facility] CancerNext-Expanded +RNAinsight Panel.  Variants of uncertain significance detected in TSC1 at  p.L439V (c.1315C>G) and in TSC2 at p.D624N (c.1870G>A).  The report date is 10/06/2021.   The CancerNext-Expanded gene panel offered by [facility] and includes sequencing, rearrangement, and RNA analysis for the following 77 genes: AIP, ALK, APC, ATM, AXIN2,  BAP1, BARD1, BLM, BMPR1A, BRCA1, BRCA2, BRIP1, CDC73, CDH1, CDK4, CDKN1B, CDKN2A, CHEK2, CTNNA1, DICER1, FANCC, FH, FLCN, GALNT12, KIF1B, LZTR1, MAX, MEN1, MET, MLH1, MSH2, MSH3, MSH6, MUTYH, NBN, NF1, NF2, NTHL1, PALB2, PHOX2B, PMS2, POT1, PRKAR1A, PTCH1, PTEN, RAD51C, RAD51D, RB1, RECQL, RET, SDHA, SDHAF2, SDHB, SDHC, SDHD, SMAD4, SMARCA4, SMARCB1, SMARCE1, STK11, SUFU, TMEM127, TP53, TSC1, TSC2, VHL and XRCC2 (sequencing and deletion/duplication); EGFR, EGLN1, HOXB13, KIT, MITF, PDGFRA, POLD1, and POLE (sequencing only); EPCAM and GREM1 (deletion/duplication only).      CHIEF COMPLIANT:  Follow-up iron deficiency anemia  INTERVAL HISTORY: Karen Mills is a 67 y.o. with above-mentioned history of right breast cancer who underwent a lumpectomy, radiation, and is currently on antiestrogen therapy with tamoxifen. She presents to the clinic for a follow-up. She says her hemoglobin got down to 5. She says she has been very fatigue. She gives out and has to stop and rest.   ALLERGIES:  is allergic to accupril [quinapril hcl], amlodipine besylate, atacand [candesartan], atorvastatin, cozaar [losartan potassium], maxzide [hydrochlorothiazide w-triamterene], tiazac [diltiazem hcl er beads], and zestril [lisinopril].  MEDICATIONS:  Current Outpatient Medications  Medication Sig Dispense Refill   allopurinol (ZYLOPRIM) 300 MG tablet Take 300 mg by mouth every morning.     Biotin 10000 MCG TABS Take 10,000 mcg by mouth every morning.     carvedilol (COREG) 6.25 MG tablet Take 6.25 mg by mouth 2 (two) times daily.     cholecalciferol (VITAMIN D3) 25 MCG (1000 UT) tablet Take 1,000 Units by mouth every morning.     furosemide (LASIX) 20 MG tablet Take 20 mg by mouth 2 (two) times daily with breakfast and lunch.  hydrALAZINE (APRESOLINE) 100 MG tablet Take 100 mg by mouth 2 (two) times daily.     levothyroxine (SYNTHROID) 100 MCG tablet Take 100 mcg by mouth daily before breakfast.     Multiple  Vitamins-Minerals (HAIR FORMULA EXTRA STRENGTH PO) 4 tablets Orally once a day     Omega-3 Fatty Acids (FISH OIL PO) Take 800 mg by mouth every morning.     pyridOXINE (VITAMIN B-6) 100 MG tablet Take 100 mg by mouth in the morning.     rosuvastatin (CRESTOR) 20 MG tablet Take 1 tablet (20 mg total) by mouth daily. 90 tablet 3   Vitamin E 670 MG (1000 UT) CAPS Take 1,000 Units by mouth every morning.     No current facility-administered medications for this visit.    PHYSICAL EXAMINATION: ECOG PERFORMANCE STATUS: 1 - Symptomatic but completely ambulatory  Vitals:   11/08/22 1505  BP: (!) 181/69  Pulse: 69  Resp: 19  Temp: (!) 97.3 F (36.3 C)  SpO2: 100%   Filed Weights   11/08/22 1505  Weight: 200 lb 2 oz (90.8 kg)      LABORATORY DATA:  I have reviewed the data as listed    Latest Ref Rng & Units 11/02/2022   11:00 AM 12/26/2021    9:29 AM 12/04/2021    9:59 AM  CMP  Glucose 70 - 99 mg/dL   145   BUN 8 - 23 mg/dL   24   Creatinine 0.44 - 1.00 mg/dL   1.58   Sodium 135 - 145 mmol/L   137   Potassium 3.5 - 5.1 mmol/L   2.7   Chloride 98 - 111 mmol/L   107   CO2 22 - 32 mmol/L   22   Calcium 8.9 - 10.3 mg/dL   7.6   Total Protein 6.0 - 8.5 g/dL 6.7     Total Bilirubin 0.0 - 1.2 mg/dL 0.4     Alkaline Phos 44 - 121 IU/L 94     AST 0 - 40 IU/L 19     ALT 0 - 32 IU/L 10  15      Lab Results  Component Value Date   WBC 4.1 11/07/2022   HGB 9.6 (L) 11/07/2022   HCT 28.7 (L) 11/07/2022   MCV 99.3 11/07/2022   PLT 318 11/07/2022   NEUTROABS 3.1 11/07/2022    ASSESSMENT & PLAN:  Malignant neoplasm of lower-outer quadrant of left breast of female, estrogen receptor positive ([place]) 04/15/2019:Screening mammogram detected 0.4cm mass in the left breast at the 6 o'clock position with no left axillary adenopathy. Biopsy on 04/03/19 showed IDC, grade 1-2, HER-2 - (0), ER +95%, PR+ 95%, Ki67 15%.  T1 a N0 stage Ia clinical stage   Treatment plan: 1.  Breast conserving  surgery with sentinel lymph node biopsy 04/30/2019: Grade 1 IDC 0.8 cm, 0/3 lymph nodes, ER 95%, PR 95%, KI 6715%, HER-2 negative, T1BN0 stage Ia 2.  Adjuvant radiation therapy 06/02/2019- 3.  Follow-up adjuvant antiestrogen therapy with anastrozole 1 mg daily x5 years -------------------------------------------------------------------------------------------------------------------- Current treatment: Adjuvant antiestrogen therapy with anastrozole 1 mg daily started 07/09/2019 (discontinued due to severe joint aches and pains) switched to tamoxifen 10 mg started 01/11/2020 .    Normocytic normochromic anemia Lab Review: 11/07/22: Hb 9.6, MCV 99.3, Ferritin 209, Iron sat: 18%  Differential diagnosis: 1. Anemia due to chronic disease and inflammation 2. anemia due to renal dysfunction 3. Combined B-12 and iron deficiency anemias 4. Hemolysis 5. Hypothyroidism 6. Plasma  cell disorders myeloma 7. Bone marrow dysfunction with MDS  Workup performed: 1. CMP to evaluate liver and kidney function 2. Haptoglobin, LDH, reticulocyte count to evaluate hemolysis 3. TSH 4. SPEP 5.  Y-50 and folic acid levels  Telephone visit in 1 week to discuss labs and decide if she needs bone marrow biopsy    Orders Placed This Encounter  Procedures   Erythropoietin    Standing Status:   Future    Number of Occurrences:   1    Standing Expiration Date:   11/09/2023   Folate    Standing Status:   Future    Number of Occurrences:   1    Standing Expiration Date:   11/09/2023   Vitamin B12    Standing Status:   Future    Number of Occurrences:   1    Standing Expiration Date:   11/09/2023   CMP ([place] only)    Standing Status:   Future    Number of Occurrences:   1    Standing Expiration Date:   11/09/2023   Haptoglobin    Standing Status:   Future    Number of Occurrences:   1    Standing Expiration Date:   11/09/2023   Retic Panel    Standing Status:   Future    Number of Occurrences:   1     Standing Expiration Date:   11/09/2023   Thyroid Panel With TSH    Standing Status:   Future    Number of Occurrences:   1    Standing Expiration Date:   11/08/2023   Multiple Myeloma Panel (SPEP&IFE w/QIG)    Standing Status:   Future    Number of Occurrences:   1    Standing Expiration Date:   11/09/2023   Direct antiglobulin test (not at [facility])    Standing Status:   Future    Number of Occurrences:   1    Standing Expiration Date:   11/09/2023   The patient has a good understanding of the overall plan. she agrees with it. she will call with any problems that may develop before the next visit here. Total time spent: 30 mins including face to face time and time spent for planning, charting and co-ordination of care   Harriette Ohara, MD 11/08/22    I Gardiner Coins am acting as a Education administrator for [employer]  I have reviewed the above documentation for accuracy and completeness, and I agree with the above.

## 2022-11-09 LAB — HAPTOGLOBIN: Haptoglobin: 141 mg/dL (ref 37–355)

## 2022-11-10 LAB — THYROID PANEL WITH TSH
Free Thyroxine Index: 2.8 (ref 1.2–4.9)
T3 Uptake Ratio: 35 % (ref 24–39)
T4, Total: 8 ug/dL (ref 4.5–12.0)
TSH: 3.18 u[IU]/mL (ref 0.450–4.500)

## 2022-11-10 LAB — ERYTHROPOIETIN: Erythropoietin: 11.6 m[IU]/mL (ref 2.6–18.5)

## 2022-11-13 LAB — MULTIPLE MYELOMA PANEL, SERUM
Albumin SerPl Elph-Mcnc: 3.6 g/dL (ref 2.9–4.4)
Albumin/Glob SerPl: 1 (ref 0.7–1.7)
Alpha 1: 0.3 g/dL (ref 0.0–0.4)
Alpha2 Glob SerPl Elph-Mcnc: 0.7 g/dL (ref 0.4–1.0)
B-Globulin SerPl Elph-Mcnc: 1 g/dL (ref 0.7–1.3)
Gamma Glob SerPl Elph-Mcnc: 1.7 g/dL (ref 0.4–1.8)
Globulin, Total: 3.7 g/dL (ref 2.2–3.9)
IgA: 145 mg/dL (ref 87–352)
IgG (Immunoglobin G), Serum: 1677 mg/dL — ABNORMAL HIGH (ref 586–1602)
IgM (Immunoglobulin M), Srm: 629 mg/dL — ABNORMAL HIGH (ref 26–217)
Total Protein ELP: 7.3 g/dL (ref 6.0–8.5)

## 2022-11-14 ENCOUNTER — Inpatient Hospital Stay (HOSPITAL_BASED_OUTPATIENT_CLINIC_OR_DEPARTMENT_OTHER): Payer: Medicare Other | Admitting: Hematology and Oncology

## 2022-11-14 ENCOUNTER — Telehealth: Payer: Self-pay | Admitting: Hematology and Oncology

## 2022-11-14 DIAGNOSIS — Z170 Estrogen receptor positive status [ER+]: Secondary | ICD-10-CM

## 2022-11-14 DIAGNOSIS — C50512 Malignant neoplasm of lower-outer quadrant of left female breast: Secondary | ICD-10-CM

## 2022-11-14 DIAGNOSIS — D649 Anemia, unspecified: Secondary | ICD-10-CM

## 2022-11-14 NOTE — Telephone Encounter (Signed)
Scheduled appointment per 2/7 los. Patient is aware of the made appointment.

## 2022-11-14 NOTE — Progress Notes (Signed)
HEMATOLOGY-ONCOLOGY TELEPHONE VISIT PROGRESS NOTE  I connected with our patient on 11/14/22 at  9:00 AM EST by telephone and verified that I am speaking with the correct person using two identifiers.  I discussed the limitations, risks, security and privacy concerns of performing an evaluation and management service by telephone and the availability of in person appointments.  I also discussed with the patient that there may be a patient responsible charge related to this service. The patient expressed understanding and agreed to proceed.   History of Present Illness: Karen Mills is a 67 y.o. with above-mentioned history of right breast cancer who underwent a lumpectomy, radiation, and is currently on antiestrogen therapy with tamoxifen. She presents to the clinic for a  telephone follow-up to discuss labs and decide on a treatment plan.  Oncology History  Malignant neoplasm of lower-outer quadrant of left breast of female, estrogen receptor positive ([place])  04/03/2019 Cancer Staging   Staging form: Breast, AJCC 8th Edition - Clinical stage from 04/03/2019: Stage IA (cT1a, cN0, cM0, G2, ER+, PR+, HER2-) - Signed by Gardenia Phlegm, NP on 04/15/2019   04/15/2019 Initial Diagnosis   Screening mammogram detected 0.4cm mass in the left breast at the 6 o'clock position with no left axillary adenopathy. Biopsy on 04/03/19 showed IDC, grade 1-2, HER-2 - (0), ER +95%, PR+ 95%, Ki67 15%.    04/30/2019 Surgery   Left lumpectomy Marlou Starks): IDC, grade 1, 0.8cm, intermediate grade DCIS, lymphovascular invasion present, clear margins. Three left axillary lymph nodes negative for carcinoma.    06/02/2019 -  Radiation Therapy   Adjuvant radiation   08/2019 -  Anti-estrogen oral therapy   Anastrozole daily stopped 10/2019 due to severe joint pain; switched to tamoxifen '10mg'$  on 01/11/20   10/06/2021 Genetic Testing   Negative hereditary cancer genetic testing: no pathogenic variants detected in [facility]  CancerNext-Expanded +RNAinsight Panel.  Variants of uncertain significance detected in TSC1 at  p.L439V (c.1315C>G) and in TSC2 at p.D624N (c.1870G>A).  The report date is 10/06/2021.   The CancerNext-Expanded gene panel offered by [facility] and includes sequencing, rearrangement, and RNA analysis for the following 77 genes: AIP, ALK, APC, ATM, AXIN2, BAP1, BARD1, BLM, BMPR1A, BRCA1, BRCA2, BRIP1, CDC73, CDH1, CDK4, CDKN1B, CDKN2A, CHEK2, CTNNA1, DICER1, FANCC, FH, FLCN, GALNT12, KIF1B, LZTR1, MAX, MEN1, MET, MLH1, MSH2, MSH3, MSH6, MUTYH, NBN, NF1, NF2, NTHL1, PALB2, PHOX2B, PMS2, POT1, PRKAR1A, PTCH1, PTEN, RAD51C, RAD51D, RB1, RECQL, RET, SDHA, SDHAF2, SDHB, SDHC, SDHD, SMAD4, SMARCA4, SMARCB1, SMARCE1, STK11, SUFU, TMEM127, TP53, TSC1, TSC2, VHL and XRCC2 (sequencing and deletion/duplication); EGFR, EGLN1, HOXB13, KIT, MITF, PDGFRA, POLD1, and POLE (sequencing only); EPCAM and GREM1 (deletion/duplication only).      REVIEW OF SYSTEMS:   Constitutional: Denies fevers, chills or abnormal weight loss All other systems were reviewed with the patient and are negative. Observations/Objective:     Assessment Plan:  Malignant neoplasm of lower-outer quadrant of left breast of female, estrogen receptor positive ([name]) 04/15/2019:Screening mammogram detected 0.4cm mass in the left breast at the 6 o'clock position with no left axillary adenopathy. Biopsy on 04/03/19 showed IDC, grade 1-2, HER-2 - (0), ER +95%, PR+ 95%, Ki67 15%.  T1 a N0 stage Ia clinical stage   Treatment plan: 1.  Breast conserving surgery with sentinel lymph node biopsy 04/30/2019: Grade 1 IDC 0.8 cm, 0/3 lymph nodes, ER 95%, PR 95%, KI 6715%, HER-2 negative, T1BN0 stage Ia 2.  Adjuvant radiation therapy 06/02/2019- 3.  Follow-up adjuvant antiestrogen therapy with anastrozole 1 mg daily  x5 years -------------------------------------------------------------------------------------------------------------------- Current treatment:  Adjuvant antiestrogen therapy with anastrozole 1 mg daily started 07/09/2019 (discontinued due to severe joint aches and pains) switched to tamoxifen 10 mg started 01/11/2020 .  Tamoxifen toxicities: Tolerating it extremely well Breast cancer surveillance: 04/04/2022: Benign  Normocytic normochromic anemia Lab Review: 11/07/22: Hb 9.6, MCV 99.3, Ferritin 209, Iron sat: 18% 11/08/2022: Erythropoietin 11.6, SPEP: No M protein, TSH 3.18, reticulocyte count 1.3%, immature reticulocyte fraction 16.1, reticulocyte hemoglobin 36.1 (iron deficiency excluded), DAT negative, haptoglobin 141, creatinine 1.1, B12 410, folate greater than 40  This concludes that the cause of anemia is anemia of chronic kidney disease Treatment options include erythropoietin stimulating agents versus observation I recommended that we watch her blood count 1 more time in a month to see if she would spontaneously recover without starting her on an injection regimen.  Return to clinic in 1 month with labs   I discussed the assessment and treatment plan with the patient. The patient was provided an opportunity to ask questions and all were answered. The patient agreed with the plan and demonstrated an understanding of the instructions. The patient was advised to call back or seek an in-person evaluation if the symptoms worsen or if the condition fails to improve as anticipated.   I provided 12 minutes of non-face-to-face time during this encounter.  This includes time for charting and coordination of care   Harriette Ohara, MD  I Gardiner Coins am acting as a scribe for Dr.Zanylah Hardie  I have reviewed the above documentation for accuracy and completeness, and I agree with the above.

## 2022-11-14 NOTE — Assessment & Plan Note (Signed)
04/15/2019:Screening mammogram detected 0.4cm mass in the left breast at the 6 o'clock position with no left axillary adenopathy. Biopsy on 04/03/19 showed IDC, grade 1-2, HER-2 - (0), ER +95%, PR+ 95%, Ki67 15%.  T1 a N0 stage Ia clinical stage   Treatment plan: 1.  Breast conserving surgery with sentinel lymph node biopsy 04/30/2019: Grade 1 IDC 0.8 cm, 0/3 lymph nodes, ER 95%, PR 95%, KI 6715%, HER-2 negative, T1BN0 stage Ia 2.  Adjuvant radiation therapy 06/02/2019- 3.  Follow-up adjuvant antiestrogen therapy with anastrozole 1 mg daily x5 years -------------------------------------------------------------------------------------------------------------------- Current treatment: Adjuvant antiestrogen therapy with anastrozole 1 mg daily started 07/09/2019 (discontinued due to severe joint aches and pains) switched to tamoxifen 10 mg started 01/11/2020 .  Tamoxifen toxicities: Tolerating it extremely well Breast cancer surveillance: 04/04/2022: Benign

## 2022-11-14 NOTE — Assessment & Plan Note (Signed)
Lab Review: 11/07/22: Hb 9.6, MCV 99.3, Ferritin 209, Iron sat: 18% 11/08/2022: Erythropoietin 11.6, SPEP: No M protein, TSH 3.18, reticulocyte count 1.3%, immature reticulocyte fraction 16.1, reticulocyte hemoglobin 36.1 (iron deficiency excluded), DAT negative, haptoglobin 141, creatinine 1.1, B12 410, folate greater than 40  This concludes that the cause of anemia is anemia of chronic kidney disease Treatment options include erythropoietin stimulating agents versus observation

## 2022-11-19 ENCOUNTER — Other Ambulatory Visit: Payer: Medicare Other

## 2022-11-19 ENCOUNTER — Telehealth: Payer: Self-pay | Admitting: Pharmacist

## 2022-11-19 DIAGNOSIS — I2510 Atherosclerotic heart disease of native coronary artery without angina pectoris: Secondary | ICD-10-CM

## 2022-11-19 NOTE — Telephone Encounter (Signed)
Pt scheduled for lipid appt on Wed, likely does not need.  Saw PharmD Nov 2023, started on rosuvastatin 82m daily. Lipids checked at Jan 2024 visit with Dr TAli Lowe LDL improved to 75. Goal < 70 due to elevated calcium score of 342 (92nd percentile). Her rosuvastatin was increased to 269mdaily 2 weeks ago.  If tolerating well, can recheck lipids in 1-2 months. If not tolerating dose increase well, would decrease back to 1040maily and add ezetimibe then recheck labs in 1-2 months. Previously intolerant to atorvastatin and simvastatin.

## 2022-11-21 ENCOUNTER — Ambulatory Visit: Payer: Medicare Other

## 2022-11-21 NOTE — Telephone Encounter (Addendum)
Called pt - she is tolerating higher dose of rosuvastatin 41m daily well. This should bring her LDL to goal. Canceled office appt this morning and scheduled lab work in 1 month to assess efficacy of statin dose increase. Pt appreciative for the call.

## 2022-12-11 NOTE — Progress Notes (Signed)
Patient Care Team: Nicholas Lose, MD as PCP - General (Hematology and Oncology) Nicholas Lose, MD as Consulting Physician (Hematology and Oncology) Kyung Rudd, MD as Consulting Physician (Radiation Oncology) Jovita Kussmaul, MD as Consulting Physician (General Surgery)  DIAGNOSIS: No diagnosis found.  SUMMARY OF ONCOLOGIC HISTORY: Oncology History  Malignant neoplasm of lower-outer quadrant of left breast of female, estrogen receptor positive ([place])  04/03/2019 Cancer Staging   Staging form: Breast, AJCC 8th Edition - Clinical stage from 04/03/2019: Stage IA (cT1a, cN0, cM0, G2, ER+, PR+, HER2-) - Signed by Gardenia Phlegm, NP on 04/15/2019   04/15/2019 Initial Diagnosis   Screening mammogram detected 0.4cm mass in the left breast at the 6 o'clock position with no left axillary adenopathy. Biopsy on 04/03/19 showed IDC, grade 1-2, HER-2 - (0), ER +95%, PR+ 95%, Ki67 15%.    04/30/2019 Surgery   Left lumpectomy Marlou Starks): IDC, grade 1, 0.8cm, intermediate grade DCIS, lymphovascular invasion present, clear margins. Three left axillary lymph nodes negative for carcinoma.    06/02/2019 -  Radiation Therapy   Adjuvant radiation   08/2019 -  Anti-estrogen oral therapy   Anastrozole daily stopped 10/2019 due to severe joint pain; switched to tamoxifen '10mg'$  on 01/11/20   10/06/2021 Genetic Testing   Negative hereditary cancer genetic testing: no pathogenic variants detected in [facility] CancerNext-Expanded +RNAinsight Panel.  Variants of uncertain significance detected in TSC1 at  p.L439V (c.1315C>G) and in TSC2 at p.D624N (c.1870G>A).  The report date is 10/06/2021.   The CancerNext-Expanded gene panel offered by [facility] and includes sequencing, rearrangement, and RNA analysis for the following 77 genes: AIP, ALK, APC, ATM, AXIN2, BAP1, BARD1, BLM, BMPR1A, BRCA1, BRCA2, BRIP1, CDC73, CDH1, CDK4, CDKN1B, CDKN2A, CHEK2, CTNNA1, DICER1, FANCC, FH, FLCN, GALNT12, KIF1B, LZTR1, MAX, MEN1, MET,  MLH1, MSH2, MSH3, MSH6, MUTYH, NBN, NF1, NF2, NTHL1, PALB2, PHOX2B, PMS2, POT1, PRKAR1A, PTCH1, PTEN, RAD51C, RAD51D, RB1, RECQL, RET, SDHA, SDHAF2, SDHB, SDHC, SDHD, SMAD4, SMARCA4, SMARCB1, SMARCE1, STK11, SUFU, TMEM127, TP53, TSC1, TSC2, VHL and XRCC2 (sequencing and deletion/duplication); EGFR, EGLN1, HOXB13, KIT, MITF, PDGFRA, POLD1, and POLE (sequencing only); EPCAM and GREM1 (deletion/duplication only).      CHIEF COMPLIANT:   INTERVAL HISTORY: Karen Mills is a31 y.o. with above-mentioned history of right breast cancer who underwent a lumpectomy, radiation, and is currently on antiestrogen therapy with tamoxifen. She presents to the clinic for a follow-up.        ALLERGIES:  is allergic to accupril [quinapril hcl], amlodipine besylate, atacand [candesartan], atorvastatin, cozaar [losartan potassium], maxzide [hydrochlorothiazide w-triamterene], tiazac [diltiazem hcl er beads], and zestril [lisinopril].  MEDICATIONS:  Current Outpatient Medications  Medication Sig Dispense Refill   allopurinol (ZYLOPRIM) 300 MG tablet Take 300 mg by mouth every morning.     Biotin 10000 MCG TABS Take 10,000 mcg by mouth every morning.     carvedilol (COREG) 6.25 MG tablet Take 6.25 mg by mouth 2 (two) times daily.     cholecalciferol (VITAMIN D3) 25 MCG (1000 UT) tablet Take 1,000 Units by mouth every morning.     furosemide (LASIX) 20 MG tablet Take 20 mg by mouth 2 (two) times daily with breakfast and lunch.     hydrALAZINE (APRESOLINE) 100 MG tablet Take 100 mg by mouth 2 (two) times daily.     levothyroxine (SYNTHROID) 100 MCG tablet Take 100 mcg by mouth daily before breakfast.     Multiple Vitamins-Minerals (HAIR FORMULA EXTRA STRENGTH PO) 4 tablets Orally once a day  Omega-3 Fatty Acids (FISH OIL PO) Take 800 mg by mouth every morning.     pyridOXINE (VITAMIN B-6) 100 MG tablet Take 100 mg by mouth in the morning.     rosuvastatin (CRESTOR) 20 MG tablet Take 1 tablet (20 mg total) by  mouth daily. 90 tablet 3   Vitamin E 670 MG (1000 UT) CAPS Take 1,000 Units by mouth every morning.     No current facility-administered medications for this visit.    PHYSICAL EXAMINATION: ECOG PERFORMANCE STATUS: {CHL ONC ECOG PS:[id number]}  There were no vitals filed for this visit. There were no vitals filed for this visit.  BREAST:*** No palpable masses or nodules in either right or left breasts. No palpable axillary supraclavicular or infraclavicular adenopathy no breast tenderness or nipple discharge. (exam performed in the presence of a chaperone)  LABORATORY DATA:  I have reviewed the data as listed    Latest Ref Rng & Units 11/08/2022    3:36 PM 11/02/2022   11:00 AM 12/26/2021    9:29 AM  CMP  Glucose 70 - 99 mg/dL 95     BUN 8 - 23 mg/dL 28     Creatinine 0.44 - 1.00 mg/dL 1.10     Sodium 135 - 145 mmol/L 131     Potassium 3.5 - 5.1 mmol/L 4.2     Chloride 98 - 111 mmol/L 100     CO2 22 - 32 mmol/L 24     Calcium 8.9 - 10.3 mg/dL 9.5     Total Protein 6.5 - 8.1 g/dL 7.6  6.7    Total Bilirubin 0.3 - 1.2 mg/dL 0.6  0.4    Alkaline Phos 38 - 126 U/L 93  94    AST 15 - 41 U/L 20  19    ALT 0 - 44 U/L '12  10  15     '$ Lab Results  Component Value Date   WBC 4.1 11/07/2022   HGB 9.6 (L) 11/07/2022   HCT 28.7 (L) 11/07/2022   MCV 99.3 11/07/2022   PLT 318 11/07/2022   NEUTROABS 3.1 11/07/2022    ASSESSMENT & PLAN:  No problem-specific Assessment & Plan notes found for this encounter.    No orders of the defined types were placed in this encounter.  The patient has a good understanding of the overall plan. she agrees with it. she will call with any problems that may develop before the next visit here. Total time spent: 30 mins including face to face time and time spent for planning, charting and co-ordination of care   Suzzette Righter, [place] 12/11/22    I Gardiner Coins am acting as a Education administrator for [employer]  ***

## 2022-12-12 ENCOUNTER — Inpatient Hospital Stay (HOSPITAL_BASED_OUTPATIENT_CLINIC_OR_DEPARTMENT_OTHER): Payer: Medicare Other | Admitting: Hematology and Oncology

## 2022-12-12 ENCOUNTER — Inpatient Hospital Stay: Payer: Medicare Other | Attending: Hematology and Oncology

## 2022-12-12 VITALS — BP 140/70 | HR 73 | Temp 97.8°F | Resp 18 | Ht 68.0 in | Wt 197.4 lb

## 2022-12-12 DIAGNOSIS — N189 Chronic kidney disease, unspecified: Secondary | ICD-10-CM | POA: Insufficient documentation

## 2022-12-12 DIAGNOSIS — R609 Edema, unspecified: Secondary | ICD-10-CM | POA: Insufficient documentation

## 2022-12-12 DIAGNOSIS — R5383 Other fatigue: Secondary | ICD-10-CM | POA: Insufficient documentation

## 2022-12-12 DIAGNOSIS — D649 Anemia, unspecified: Secondary | ICD-10-CM

## 2022-12-12 DIAGNOSIS — Z79811 Long term (current) use of aromatase inhibitors: Secondary | ICD-10-CM | POA: Diagnosis not present

## 2022-12-12 DIAGNOSIS — Z170 Estrogen receptor positive status [ER+]: Secondary | ICD-10-CM

## 2022-12-12 DIAGNOSIS — C50512 Malignant neoplasm of lower-outer quadrant of left female breast: Secondary | ICD-10-CM

## 2022-12-12 DIAGNOSIS — Z79899 Other long term (current) drug therapy: Secondary | ICD-10-CM | POA: Diagnosis not present

## 2022-12-12 DIAGNOSIS — D631 Anemia in chronic kidney disease: Secondary | ICD-10-CM | POA: Diagnosis not present

## 2022-12-12 LAB — CBC WITH DIFFERENTIAL (CANCER CENTER ONLY)
Abs Immature Granulocytes: 0.02 10*3/uL (ref 0.00–0.07)
Basophils Absolute: 0.1 10*3/uL (ref 0.0–0.1)
Basophils Relative: 1 %
Eosinophils Absolute: 0 10*3/uL (ref 0.0–0.5)
Eosinophils Relative: 1 %
HCT: 27.6 % — ABNORMAL LOW (ref 36.0–46.0)
Hemoglobin: 9.5 g/dL — ABNORMAL LOW (ref 12.0–15.0)
Immature Granulocytes: 1 %
Lymphocytes Relative: 18 %
Lymphs Abs: 0.7 10*3/uL (ref 0.7–4.0)
MCH: 33.7 pg (ref 26.0–34.0)
MCHC: 34.4 g/dL (ref 30.0–36.0)
MCV: 97.9 fL (ref 80.0–100.0)
Monocytes Absolute: 0.3 10*3/uL (ref 0.1–1.0)
Monocytes Relative: 8 %
Neutro Abs: 2.8 10*3/uL (ref 1.7–7.7)
Neutrophils Relative %: 71 %
Platelet Count: 331 10*3/uL (ref 150–400)
RBC: 2.82 MIL/uL — ABNORMAL LOW (ref 3.87–5.11)
RDW: 13.8 % (ref 11.5–15.5)
WBC Count: 3.9 10*3/uL — ABNORMAL LOW (ref 4.0–10.5)
nRBC: 0 % (ref 0.0–0.2)

## 2022-12-12 LAB — CMP (CANCER CENTER ONLY)
ALT: 17 U/L (ref 0–44)
AST: 22 U/L (ref 15–41)
Albumin: 3.7 g/dL (ref 3.5–5.0)
Alkaline Phosphatase: 97 U/L (ref 38–126)
Anion gap: 7 (ref 5–15)
BUN: 33 mg/dL — ABNORMAL HIGH (ref 8–23)
CO2: 24 mmol/L (ref 22–32)
Calcium: 9.3 mg/dL (ref 8.9–10.3)
Chloride: 104 mmol/L (ref 98–111)
Creatinine: 1.42 mg/dL — ABNORMAL HIGH (ref 0.44–1.00)
GFR, Estimated: 41 mL/min — ABNORMAL LOW (ref >60)
Glucose, Bld: 112 mg/dL — ABNORMAL HIGH (ref 70–99)
Potassium: 3.6 mmol/L (ref 3.5–5.1)
Sodium: 135 mmol/L (ref 135–145)
Total Bilirubin: 0.4 mg/dL (ref 0.3–1.2)
Total Protein: 7.3 g/dL (ref 6.5–8.1)

## 2022-12-12 LAB — IRON AND IRON BINDING CAPACITY (CC-WL,HP ONLY)
Iron: 34 ug/dL (ref 28–170)
Saturation Ratios: 13 % (ref 10.4–31.8)
TIBC: 258 ug/dL (ref 250–450)
UIBC: 224 ug/dL (ref 148–442)

## 2022-12-12 LAB — FERRITIN: Ferritin: 193 ng/mL (ref 11–307)

## 2022-12-12 NOTE — Assessment & Plan Note (Signed)
04/15/2019:Screening mammogram detected 0.4cm mass in the left breast at the 6 o'clock position with no left axillary adenopathy. Biopsy on 04/03/19 showed IDC, grade 1-2, HER-2 - (0), ER +95%, PR+ 95%, Ki67 15%.  T1 a N0 stage Ia clinical stage   Treatment plan: 1.  Breast conserving surgery with sentinel lymph node biopsy 04/30/2019: Grade 1 IDC 0.8 cm, 0/3 lymph nodes, ER 95%, PR 95%, KI 6715%, HER-2 negative, T1BN0 stage Ia 2.  Adjuvant radiation therapy 06/02/2019- 3.  Follow-up adjuvant antiestrogen therapy with anastrozole 1 mg daily x5 years -------------------------------------------------------------------------------------------------------------------- Current treatment: Adjuvant antiestrogen therapy with anastrozole 1 mg daily started 07/09/2019 (discontinued due to severe joint aches and pains) switched to tamoxifen 10 mg started 01/11/2020 .   Tamoxifen toxicities: Tolerating it extremely well Breast cancer surveillance: 04/04/2022: Benign   Normocytic normochromic anemia Lab Review: 11/07/22: Hb 9.6, MCV 99.3, Ferritin 209, Iron sat: 18% 11/08/2022: Erythropoietin 11.6, SPEP: No M protein, TSH 3.18, reticulocyte count 1.3%, immature reticulocyte fraction 16.1, reticulocyte hemoglobin 36.1 (iron deficiency excluded), DAT negative, haptoglobin 141, creatinine 1.1, B12 410, folate greater than 40 12/12/2022:   This concludes that the cause of anemia is anemia of chronic kidney disease Treatment options include erythropoietin stimulating agents versus observation

## 2022-12-17 ENCOUNTER — Ambulatory Visit: Payer: Medicare Other | Attending: Internal Medicine

## 2022-12-17 DIAGNOSIS — I2510 Atherosclerotic heart disease of native coronary artery without angina pectoris: Secondary | ICD-10-CM

## 2022-12-17 LAB — LIPID PANEL
Chol/HDL Ratio: 3.1 ratio (ref 0.0–4.4)
Cholesterol, Total: 110 mg/dL (ref 100–199)
HDL: 35 mg/dL — ABNORMAL LOW (ref >39)
LDL Chol Calc (NIH): 56 mg/dL (ref 0–99)
Triglycerides: 99 mg/dL (ref 0–149)
VLDL Cholesterol Cal: 19 mg/dL (ref 5–40)

## 2022-12-17 LAB — HEPATIC FUNCTION PANEL
ALT: 13 IU/L (ref 0–32)
AST: 23 IU/L (ref 0–40)
Albumin: 3.8 g/dL — ABNORMAL LOW (ref 3.9–4.9)
Alkaline Phosphatase: 102 IU/L (ref 44–121)
Bilirubin Total: 0.4 mg/dL (ref 0.0–1.2)
Bilirubin, Direct: 0.15 mg/dL (ref 0.00–0.40)
Total Protein: 6.7 g/dL (ref 6.0–8.5)

## 2022-12-18 DIAGNOSIS — H6982 Other specified disorders of Eustachian tube, left ear: Secondary | ICD-10-CM | POA: Diagnosis not present

## 2022-12-18 DIAGNOSIS — H6522 Chronic serous otitis media, left ear: Secondary | ICD-10-CM | POA: Diagnosis not present

## 2022-12-18 DIAGNOSIS — H9202 Otalgia, left ear: Secondary | ICD-10-CM | POA: Diagnosis not present

## 2022-12-18 DIAGNOSIS — H9012 Conductive hearing loss, unilateral, left ear, with unrestricted hearing on the contralateral side: Secondary | ICD-10-CM | POA: Diagnosis not present

## 2023-01-14 NOTE — Progress Notes (Signed)
Patient Care Team: Serena Croissant, MD as PCP - General (Hematology and Oncology) Serena Croissant, MD as Consulting Physician (Hematology and Oncology) Dorothy Puffer, MD as Consulting Physician (Radiation Oncology) Griselda Miner, MD as Consulting Physician (General Surgery)  DIAGNOSIS: No diagnosis found.  SUMMARY OF ONCOLOGIC HISTORY: Oncology History  Malignant neoplasm of lower-outer quadrant of left breast of female, estrogen receptor positive  04/03/2019 Cancer Staging   Staging form: Breast, AJCC 8th Edition - Clinical stage from 04/03/2019: Stage IA (cT1a, cN0, cM0, G2, ER+, PR+, HER2-) - Signed by Loa Socks, NP on 04/15/2019   04/15/2019 Initial Diagnosis   Screening mammogram detected 0.4cm mass in the left breast at the 6 o'clock position with no left axillary adenopathy. Biopsy on 04/03/19 showed IDC, grade 1-2, HER-2 - (0), ER +95%, PR+ 95%, Ki67 15%.    04/30/2019 Surgery   Left lumpectomy Karen Mills): IDC, grade 1, 0.8cm, intermediate grade DCIS, lymphovascular invasion present, clear margins. Three left axillary lymph nodes negative for carcinoma.    06/02/2019 -  Radiation Therapy   Adjuvant radiation   08/2019 -  Anti-estrogen oral therapy   Anastrozole daily stopped 10/2019 due to severe joint pain; switched to tamoxifen 10mg  on 01/11/20   10/06/2021 Genetic Testing   Negative hereditary cancer genetic testing: no pathogenic variants detected in [facility] CancerNext-Expanded +RNAinsight Panel.  Variants of uncertain significance detected in TSC1 at  p.L439V (c.1315C>G) and in TSC2 at p.D624N (c.1870G>A).  The report date is 10/06/2021.   The CancerNext-Expanded gene panel offered by [facility] and includes sequencing, rearrangement, and RNA analysis for the following 77 genes: AIP, ALK, APC, ATM, AXIN2, BAP1, BARD1, BLM, BMPR1A, BRCA1, BRCA2, BRIP1, CDC73, CDH1, CDK4, CDKN1B, CDKN2A, CHEK2, CTNNA1, DICER1, FANCC, FH, FLCN, GALNT12, KIF1B, LZTR1, MAX, MEN1, MET, MLH1,  MSH2, MSH3, MSH6, MUTYH, NBN, NF1, NF2, NTHL1, PALB2, PHOX2B, PMS2, POT1, PRKAR1A, PTCH1, PTEN, RAD51C, RAD51D, RB1, RECQL, RET, SDHA, SDHAF2, SDHB, SDHC, SDHD, SMAD4, SMARCA4, SMARCB1, SMARCE1, STK11, SUFU, TMEM127, TP53, TSC1, TSC2, VHL and XRCC2 (sequencing and deletion/duplication); EGFR, EGLN1, HOXB13, KIT, MITF, PDGFRA, POLD1, and POLE (sequencing only); EPCAM and GREM1 (deletion/duplication only).      CHIEF COMPLIANT:   INTERVAL HISTORY: Karen Mills is a   ALLERGIES:  is allergic to accupril [quinapril hcl], amlodipine besylate, atacand [candesartan], atorvastatin, cozaar [losartan potassium], maxzide [hydrochlorothiazide w-triamterene], tiazac [diltiazem hcl er beads], and zestril [lisinopril].  MEDICATIONS:  Current Outpatient Medications  Medication Sig Dispense Refill   allopurinol (ZYLOPRIM) 300 MG tablet Take 300 mg by mouth every morning.     B Complex-C (B-COMPLEX WITH VITAMIN C) tablet      Biotin 27078 MCG TABS Take 10,000 mcg by mouth every morning.     carvedilol (COREG) 6.25 MG tablet Take 6.25 mg by mouth 2 (two) times daily.     cholecalciferol (VITAMIN D3) 25 MCG (1000 UT) tablet Take 1,000 Units by mouth every morning.     furosemide (LASIX) 20 MG tablet Take 20 mg by mouth 2 (two) times daily with breakfast and lunch.     hydrALAZINE (APRESOLINE) 100 MG tablet Take 100 mg by mouth daily.     Krill Oil 1000 MG CAPS 1 capsule Orally once a day     levothyroxine (SYNTHROID) 100 MCG tablet Take 100 mcg by mouth daily before breakfast.     Multiple Vitamins-Minerals (HAIR FORMULA EXTRA STRENGTH PO) 4 tablets Orally once a day     Omega-3 Fatty Acids (FISH OIL PO) Take 800 mg by mouth  every morning.     pyridOXINE (VITAMIN B-6) 100 MG tablet Take 100 mg by mouth in the morning.     rosuvastatin (CRESTOR) 20 MG tablet Take 1 tablet (20 mg total) by mouth daily. 90 tablet 3   tamoxifen (NOLVADEX) 10 MG tablet Take 10 mg by mouth.     Vitamin E 670 MG (1000 UT) CAPS  Take 1,000 Units by mouth every morning.     No current facility-administered medications for this visit.    PHYSICAL EXAMINATION: ECOG PERFORMANCE STATUS: {CHL ONC ECOG PS:[id number]}  There were no vitals filed for this visit. There were no vitals filed for this visit.  BREAST:*** No palpable masses or nodules in either right or left breasts. No palpable axillary supraclavicular or infraclavicular adenopathy no breast tenderness or nipple discharge. (exam performed in the presence of a chaperone)  LABORATORY DATA:  I have reviewed the data as listed    Latest Ref Rng & Units 12/17/2022    9:52 AM 12/12/2022   10:33 AM 11/08/2022    3:36 PM  CMP  Glucose 70 - 99 mg/dL  191112  95   BUN 8 - 23 mg/dL  33  28   Creatinine 4.780.44 - 1.00 mg/dL  2.951.42  6.211.10   Sodium 308135 - 145 mmol/L  135  131   Potassium 3.5 - 5.1 mmol/L  3.6  4.2   Chloride 98 - 111 mmol/L  104  100   CO2 22 - 32 mmol/L  24  24   Calcium 8.9 - 10.3 mg/dL  9.3  9.5   Total Protein 6.0 - 8.5 g/dL 6.7  7.3  7.6   Total Bilirubin 0.0 - 1.2 mg/dL 0.4  0.4  0.6   Alkaline Phos 44 - 121 IU/L 102  97  93   AST 0 - 40 IU/L 23  22  20    ALT 0 - 32 IU/L 13  17  12      Lab Results  Component Value Date   WBC 3.9 (L) 12/12/2022   HGB 9.5 (L) 12/12/2022   HCT 27.6 (L) 12/12/2022   MCV 97.9 12/12/2022   PLT 331 12/12/2022   NEUTROABS 2.8 12/12/2022    ASSESSMENT & PLAN:  No problem-specific Assessment & Plan notes found for this encounter.    No orders of the defined types were placed in this encounter.  The patient has a good understanding of the overall plan. she agrees with it. she will call with any problems that may develop before the next visit here. Total time spent: 30 mins including face to face time and time spent for planning, charting and co-ordination of care   [name], CMA 01/14/23    I Janan Ridgeeritra, Favor Hackler am acting as a Neurosurgeonscribe for [employer]Vinay Gudena  ***

## 2023-01-15 ENCOUNTER — Inpatient Hospital Stay: Payer: Medicare Other | Attending: Hematology and Oncology | Admitting: Hematology and Oncology

## 2023-01-15 ENCOUNTER — Inpatient Hospital Stay: Payer: Medicare Other

## 2023-01-15 VITALS — BP 118/54 | HR 71 | Temp 97.8°F | Resp 17 | Ht 68.0 in | Wt 197.4 lb

## 2023-01-15 DIAGNOSIS — Z170 Estrogen receptor positive status [ER+]: Secondary | ICD-10-CM

## 2023-01-15 DIAGNOSIS — D649 Anemia, unspecified: Secondary | ICD-10-CM

## 2023-01-15 DIAGNOSIS — N182 Chronic kidney disease, stage 2 (mild): Secondary | ICD-10-CM | POA: Diagnosis not present

## 2023-01-15 DIAGNOSIS — N183 Chronic kidney disease, stage 3 unspecified: Secondary | ICD-10-CM | POA: Insufficient documentation

## 2023-01-15 DIAGNOSIS — C50512 Malignant neoplasm of lower-outer quadrant of left female breast: Secondary | ICD-10-CM

## 2023-01-15 DIAGNOSIS — D631 Anemia in chronic kidney disease: Secondary | ICD-10-CM | POA: Diagnosis not present

## 2023-01-15 LAB — CBC WITH DIFFERENTIAL (CANCER CENTER ONLY)
Abs Immature Granulocytes: 0.05 10*3/uL (ref 0.00–0.07)
Basophils Absolute: 0.1 10*3/uL (ref 0.0–0.1)
Basophils Relative: 1 %
Eosinophils Absolute: 0.1 10*3/uL (ref 0.0–0.5)
Eosinophils Relative: 1 %
HCT: 28.3 % — ABNORMAL LOW (ref 36.0–46.0)
Hemoglobin: 9.4 g/dL — ABNORMAL LOW (ref 12.0–15.0)
Immature Granulocytes: 1 %
Lymphocytes Relative: 20 %
Lymphs Abs: 1.1 10*3/uL (ref 0.7–4.0)
MCH: 32.6 pg (ref 26.0–34.0)
MCHC: 33.2 g/dL (ref 30.0–36.0)
MCV: 98.3 fL (ref 80.0–100.0)
Monocytes Absolute: 0.3 10*3/uL (ref 0.1–1.0)
Monocytes Relative: 5 %
Neutro Abs: 3.8 10*3/uL (ref 1.7–7.7)
Neutrophils Relative %: 72 %
Platelet Count: 317 10*3/uL (ref 150–400)
RBC: 2.88 MIL/uL — ABNORMAL LOW (ref 3.87–5.11)
RDW: 13.3 % (ref 11.5–15.5)
WBC Count: 5.3 10*3/uL (ref 4.0–10.5)
nRBC: 0 % (ref 0.0–0.2)

## 2023-01-15 LAB — RETIC PANEL
Immature Retic Fract: 21.9 % — ABNORMAL HIGH (ref 2.3–15.9)
RBC.: 2.82 MIL/uL — ABNORMAL LOW (ref 3.87–5.11)
Retic Count, Absolute: 24.5 10*3/uL (ref 19.0–186.0)
Retic Ct Pct: 0.9 % (ref 0.4–3.1)
Reticulocyte Hemoglobin: 35 pg (ref >27.9)

## 2023-01-15 NOTE — Assessment & Plan Note (Signed)
04/15/2019:Screening mammogram detected 0.4cm mass in the left breast at the 6 o'clock position with no left axillary adenopathy. Biopsy on 04/03/19 showed IDC, grade 1-2, HER-2 - (0), ER +95%, PR+ 95%, Ki67 15%.  T1 a N0 stage Ia clinical stage   Treatment plan: 1.  Breast conserving surgery with sentinel lymph node biopsy 04/30/2019: Grade 1 IDC 0.8 cm, 0/3 lymph nodes, ER 95%, PR 95%, KI 6715%, HER-2 negative, T1BN0 stage Ia 2.  Adjuvant radiation therapy 06/02/2019- 3.  Follow-up adjuvant antiestrogen therapy with anastrozole 1 mg daily x5 years -------------------------------------------------------------------------------------------------------------------- Current treatment: Adjuvant antiestrogen therapy with anastrozole 1 mg daily started 07/09/2019 (discontinued due to severe joint aches and pains) switched to tamoxifen 10 mg started 01/11/2020 .   Tamoxifen toxicities: Tolerating it extremely well  Breast cancer surveillance: Mammogram 04/04/2022: Benign, breast density category B Breast exam 01/15/2023: Benign  Return to clinic in 1 year for follow-up

## 2023-01-15 NOTE — Assessment & Plan Note (Signed)
Lab review: 12/12/2022: Ferritin 193, iron saturation 13%, hemoglobin 9.5, creatinine 1.42 01/15/2023: Hemoglobin 9.4, MCV 98.3, platelets 317  I discussed with the patient that she most likely has anemia of chronic kidney disease stage II-III. Recommendation: Retacrit 40,000 units every 3 weeks starting next week.  I will see her back with cycle 2 of Retacrit.

## 2023-01-16 DIAGNOSIS — H6522 Chronic serous otitis media, left ear: Secondary | ICD-10-CM | POA: Diagnosis not present

## 2023-01-16 DIAGNOSIS — H9012 Conductive hearing loss, unilateral, left ear, with unrestricted hearing on the contralateral side: Secondary | ICD-10-CM | POA: Diagnosis not present

## 2023-01-16 DIAGNOSIS — H9202 Otalgia, left ear: Secondary | ICD-10-CM | POA: Diagnosis not present

## 2023-01-22 ENCOUNTER — Inpatient Hospital Stay: Payer: Medicare Other

## 2023-01-22 VITALS — BP 147/68 | HR 67 | Temp 98.3°F | Resp 18

## 2023-01-22 DIAGNOSIS — N183 Chronic kidney disease, stage 3 unspecified: Secondary | ICD-10-CM | POA: Diagnosis not present

## 2023-01-22 DIAGNOSIS — D649 Anemia, unspecified: Secondary | ICD-10-CM

## 2023-01-22 DIAGNOSIS — D631 Anemia in chronic kidney disease: Secondary | ICD-10-CM | POA: Diagnosis not present

## 2023-01-22 DIAGNOSIS — N182 Chronic kidney disease, stage 2 (mild): Secondary | ICD-10-CM

## 2023-01-22 LAB — CMP (CANCER CENTER ONLY)
ALT: 12 U/L (ref 0–44)
AST: 19 U/L (ref 15–41)
Albumin: 3.5 g/dL (ref 3.5–5.0)
Alkaline Phosphatase: 73 U/L (ref 38–126)
Anion gap: 7 (ref 5–15)
BUN: 18 mg/dL (ref 8–23)
CO2: 22 mmol/L (ref 22–32)
Calcium: 9.4 mg/dL (ref 8.9–10.3)
Chloride: 109 mmol/L (ref 98–111)
Creatinine: 1.1 mg/dL — ABNORMAL HIGH (ref 0.44–1.00)
GFR, Estimated: 55 mL/min — ABNORMAL LOW (ref >60)
Glucose, Bld: 112 mg/dL — ABNORMAL HIGH (ref 70–99)
Potassium: 4.1 mmol/L (ref 3.5–5.1)
Sodium: 138 mmol/L (ref 135–145)
Total Bilirubin: 0.5 mg/dL (ref 0.3–1.2)
Total Protein: 6.8 g/dL (ref 6.5–8.1)

## 2023-01-22 LAB — CBC WITH DIFFERENTIAL (CANCER CENTER ONLY)
Abs Immature Granulocytes: 0.02 10*3/uL (ref 0.00–0.07)
Basophils Absolute: 0.1 10*3/uL (ref 0.0–0.1)
Basophils Relative: 1 %
Eosinophils Absolute: 0 10*3/uL (ref 0.0–0.5)
Eosinophils Relative: 1 %
HCT: 27.8 % — ABNORMAL LOW (ref 36.0–46.0)
Hemoglobin: 9.1 g/dL — ABNORMAL LOW (ref 12.0–15.0)
Immature Granulocytes: 0 %
Lymphocytes Relative: 14 %
Lymphs Abs: 0.7 10*3/uL (ref 0.7–4.0)
MCH: 32.6 pg (ref 26.0–34.0)
MCHC: 32.7 g/dL (ref 30.0–36.0)
MCV: 99.6 fL (ref 80.0–100.0)
Monocytes Absolute: 0.3 10*3/uL (ref 0.1–1.0)
Monocytes Relative: 6 %
Neutro Abs: 3.6 10*3/uL (ref 1.7–7.7)
Neutrophils Relative %: 78 %
Platelet Count: 346 10*3/uL (ref 150–400)
RBC: 2.79 MIL/uL — ABNORMAL LOW (ref 3.87–5.11)
RDW: 13.8 % (ref 11.5–15.5)
WBC Count: 4.7 10*3/uL (ref 4.0–10.5)
nRBC: 0 % (ref 0.0–0.2)

## 2023-01-22 MED ORDER — EPOETIN ALFA-EPBX 40000 UNIT/ML IJ SOLN
40000.0000 [IU] | Freq: Once | INTRAMUSCULAR | Status: AC
  Administered 2023-01-22: 40000 [IU] via SUBCUTANEOUS
  Filled 2023-01-22: qty 1

## 2023-01-22 NOTE — Patient Instructions (Signed)

## 2023-01-23 DIAGNOSIS — M109 Gout, unspecified: Secondary | ICD-10-CM | POA: Diagnosis not present

## 2023-01-23 DIAGNOSIS — I10 Essential (primary) hypertension: Secondary | ICD-10-CM | POA: Diagnosis not present

## 2023-01-23 DIAGNOSIS — Z0000 Encounter for general adult medical examination without abnormal findings: Secondary | ICD-10-CM | POA: Diagnosis not present

## 2023-01-23 DIAGNOSIS — E039 Hypothyroidism, unspecified: Secondary | ICD-10-CM | POA: Diagnosis not present

## 2023-01-23 DIAGNOSIS — E782 Mixed hyperlipidemia: Secondary | ICD-10-CM | POA: Diagnosis not present

## 2023-02-09 NOTE — Progress Notes (Signed)
Patient Care Team: Serena Croissant, MD as PCP - General (Hematology and Oncology) Serena Croissant, MD as Consulting Physician (Hematology and Oncology) Dorothy Puffer, MD as Consulting Physician (Radiation Oncology) Griselda Miner, MD as Consulting Physician (General Surgery)  DIAGNOSIS: No diagnosis found.  SUMMARY OF ONCOLOGIC HISTORY: Oncology History  Malignant neoplasm of lower-outer quadrant of left breast of female, estrogen receptor positive (HCC)  04/03/2019 Cancer Staging   Staging form: Breast, AJCC 8th Edition - Clinical stage from 04/03/2019: Stage IA (cT1a, cN0, cM0, G2, ER+, PR+, HER2-) - Signed by Loa Socks, NP on 04/15/2019   04/15/2019 Initial Diagnosis   Screening mammogram detected 0.4cm mass in the left breast at the 6 Karen'clock position with no left axillary adenopathy. Biopsy on 04/03/19 showed IDC, grade 1-2, HER-2 - (0), ER +95%, PR+ 95%, Ki67 15%.    04/30/2019 Surgery   Left lumpectomy Carolynne Edouard): IDC, grade 1, 0.8cm, intermediate grade DCIS, lymphovascular invasion present, clear margins. Three left axillary lymph nodes negative for carcinoma.    06/02/2019 -  Radiation Therapy   Adjuvant radiation   08/2019 -  Anti-estrogen oral therapy   Anastrozole daily stopped 10/2019 due to severe joint pain; switched to tamoxifen 10mg  on 01/11/20   10/06/2021 Genetic Testing   Negative hereditary cancer genetic testing: no pathogenic variants detected in [facility] CancerNext-Expanded +RNAinsight Panel.  Variants of uncertain significance detected in TSC1 at  p.L439V (c.1315C>G) and in TSC2 at p.D624N (c.1870G>A).  The report date is 10/06/2021.   The CancerNext-Expanded gene panel offered by [facility] and includes sequencing, rearrangement, and RNA analysis for the following 77 genes: AIP, ALK, APC, ATM, AXIN2, BAP1, BARD1, BLM, BMPR1A, BRCA1, BRCA2, BRIP1, CDC73, CDH1, CDK4, CDKN1B, CDKN2A, CHEK2, CTNNA1, DICER1, FANCC, FH, FLCN, GALNT12, KIF1B, LZTR1, MAX, MEN1, MET,  MLH1, MSH2, MSH3, MSH6, MUTYH, NBN, NF1, NF2, NTHL1, PALB2, PHOX2B, PMS2, POT1, PRKAR1A, PTCH1, PTEN, RAD51C, RAD51D, RB1, RECQL, RET, SDHA, SDHAF2, SDHB, SDHC, SDHD, SMAD4, SMARCA4, SMARCB1, SMARCE1, STK11, SUFU, TMEM127, TP53, TSC1, TSC2, VHL and XRCC2 (sequencing and deletion/duplication); EGFR, EGLN1, HOXB13, KIT, MITF, PDGFRA, POLD1, and POLE (sequencing only); EPCAM and GREM1 (deletion/duplication only).      CHIEF COMPLIANT:   INTERVAL HISTORY: Karen Mills is a   ALLERGIES:  is allergic to accupril [quinapril hcl], amlodipine besylate, atacand [candesartan], atorvastatin, cozaar [losartan potassium], maxzide [hydrochlorothiazide w-triamterene], tiazac [diltiazem hcl er beads], and zestril [lisinopril].  MEDICATIONS:  Current Outpatient Medications  Medication Sig Dispense Refill   allopurinol (ZYLOPRIM) 300 MG tablet Take 300 mg by mouth every morning.     B Complex-C (B-COMPLEX WITH VITAMIN C) tablet      Biotin 81191 MCG TABS Take 10,000 mcg by mouth every morning.     carvedilol (COREG) 6.25 MG tablet Take 6.25 mg by mouth 2 (two) times daily.     cholecalciferol (VITAMIN D3) 25 MCG (1000 UT) tablet Take 1,000 Units by mouth every morning.     furosemide (LASIX) 20 MG tablet Take 20 mg by mouth 2 (two) times daily with breakfast and lunch.     hydrALAZINE (APRESOLINE) 100 MG tablet Take 100 mg by mouth daily.     Krill Oil 1000 MG CAPS 1 capsule Orally once a day     levothyroxine (SYNTHROID) 100 MCG tablet Take 100 mcg by mouth daily before breakfast.     liothyronine (CYTOMEL) 5 MCG tablet 1 tablet on an empty stomach     Multiple Vitamins-Minerals (HAIR FORMULA EXTRA STRENGTH PO) 4 tablets Orally once a  day     Omega-3 Fatty Acids (FISH OIL PO) Take 800 mg by mouth every morning.     pyridOXINE (VITAMIN B-6) 100 MG tablet Take 100 mg by mouth in the morning.     rosuvastatin (CRESTOR) 20 MG tablet Take 1 tablet (20 mg total) by mouth daily. 90 tablet 3   tamoxifen  (NOLVADEX) 10 MG tablet Take 10 mg by mouth.     Vitamin E 670 MG (1000 UT) CAPS Take 1,000 Units by mouth every morning.     No current facility-administered medications for this visit.    PHYSICAL EXAMINATION: ECOG PERFORMANCE STATUS: {CHL ONC ECOG PS:[id number]}  There were no vitals filed for this visit. There were no vitals filed for this visit.  BREAST:*** No palpable masses or nodules in either right or left breasts. No palpable axillary supraclavicular or infraclavicular adenopathy no breast tenderness or nipple discharge. (exam performed in the presence of a chaperone)  LABORATORY DATA:  I have reviewed the data as listed    Latest Ref Rng & Units 01/22/2023    8:45 AM 12/17/2022    9:52 AM 12/12/2022   10:33 AM  CMP  Glucose 70 - 99 mg/dL 161   096   BUN 8 - 23 mg/dL 18   33   Creatinine 0.45 - 1.00 mg/dL 4.09   8.11   Sodium 914 - 145 mmol/L 138   135   Potassium 3.5 - 5.1 mmol/L 4.1   3.6   Chloride 98 - 111 mmol/L 109   104   CO2 22 - 32 mmol/L 22   24   Calcium 8.9 - 10.3 mg/dL 9.4   9.3   Total Protein 6.5 - 8.1 g/dL 6.8  6.7  7.3   Total Bilirubin 0.3 - 1.2 mg/dL 0.5  0.4  0.4   Alkaline Phos 38 - 126 U/L 73  102  97   AST 15 - 41 U/L 19  23  22    ALT 0 - 44 U/L 12  13  17      Lab Results  Component Value Date   WBC 4.7 01/22/2023   HGB 9.1 (L) 01/22/2023   HCT 27.8 (L) 01/22/2023   MCV 99.6 01/22/2023   PLT 346 01/22/2023   NEUTROABS 3.6 01/22/2023    ASSESSMENT & PLAN:  No problem-specific Assessment & Plan notes found for this encounter.    No orders of the defined types were placed in this encounter.  The patient has a good understanding of the overall plan. she agrees with it. she will call with any problems that may develop before the next visit here. Total time spent: 30 mins including face to face time and time spent for planning, charting and co-ordination of care   Sherlyn Lick, CMA 02/09/23    I Janan Ridge am acting as a  Neurosurgeon for [employer]  ***

## 2023-02-12 ENCOUNTER — Inpatient Hospital Stay: Payer: Medicare Other

## 2023-02-12 ENCOUNTER — Inpatient Hospital Stay: Payer: Medicare Other | Attending: Hematology and Oncology

## 2023-02-12 ENCOUNTER — Inpatient Hospital Stay (HOSPITAL_BASED_OUTPATIENT_CLINIC_OR_DEPARTMENT_OTHER): Payer: Medicare Other | Admitting: Hematology and Oncology

## 2023-02-12 VITALS — BP 135/59 | HR 72 | Temp 97.3°F | Resp 18 | Ht 68.0 in | Wt 199.7 lb

## 2023-02-12 DIAGNOSIS — N182 Chronic kidney disease, stage 2 (mild): Secondary | ICD-10-CM

## 2023-02-12 DIAGNOSIS — C50512 Malignant neoplasm of lower-outer quadrant of left female breast: Secondary | ICD-10-CM | POA: Insufficient documentation

## 2023-02-12 DIAGNOSIS — Z170 Estrogen receptor positive status [ER+]: Secondary | ICD-10-CM | POA: Diagnosis not present

## 2023-02-12 DIAGNOSIS — Z79899 Other long term (current) drug therapy: Secondary | ICD-10-CM | POA: Diagnosis not present

## 2023-02-12 DIAGNOSIS — D649 Anemia, unspecified: Secondary | ICD-10-CM

## 2023-02-12 DIAGNOSIS — D631 Anemia in chronic kidney disease: Secondary | ICD-10-CM | POA: Insufficient documentation

## 2023-02-12 DIAGNOSIS — N183 Chronic kidney disease, stage 3 unspecified: Secondary | ICD-10-CM | POA: Diagnosis not present

## 2023-02-12 LAB — CMP (CANCER CENTER ONLY)
ALT: 14 U/L (ref 0–44)
AST: 21 U/L (ref 15–41)
Albumin: 3.5 g/dL (ref 3.5–5.0)
Alkaline Phosphatase: 79 U/L (ref 38–126)
Anion gap: 8 (ref 5–15)
BUN: 21 mg/dL (ref 8–23)
CO2: 18 mmol/L — ABNORMAL LOW (ref 22–32)
Calcium: 9.2 mg/dL (ref 8.9–10.3)
Chloride: 108 mmol/L (ref 98–111)
Creatinine: 1.07 mg/dL — ABNORMAL HIGH (ref 0.44–1.00)
GFR, Estimated: 57 mL/min — ABNORMAL LOW (ref >60)
Glucose, Bld: 110 mg/dL — ABNORMAL HIGH (ref 70–99)
Potassium: 3.6 mmol/L (ref 3.5–5.1)
Sodium: 134 mmol/L — ABNORMAL LOW (ref 135–145)
Total Bilirubin: 0.7 mg/dL (ref 0.3–1.2)
Total Protein: 7 g/dL (ref 6.5–8.1)

## 2023-02-12 LAB — CBC WITH DIFFERENTIAL (CANCER CENTER ONLY)
Abs Immature Granulocytes: 0.03 10*3/uL (ref 0.00–0.07)
Basophils Absolute: 0.1 10*3/uL (ref 0.0–0.1)
Basophils Relative: 1 %
Eosinophils Absolute: 0 10*3/uL (ref 0.0–0.5)
Eosinophils Relative: 1 %
HCT: 28.6 % — ABNORMAL LOW (ref 36.0–46.0)
Hemoglobin: 9.7 g/dL — ABNORMAL LOW (ref 12.0–15.0)
Immature Granulocytes: 1 %
Lymphocytes Relative: 17 %
Lymphs Abs: 0.7 10*3/uL (ref 0.7–4.0)
MCH: 33.7 pg (ref 26.0–34.0)
MCHC: 33.9 g/dL (ref 30.0–36.0)
MCV: 99.3 fL (ref 80.0–100.0)
Monocytes Absolute: 0.3 10*3/uL (ref 0.1–1.0)
Monocytes Relative: 6 %
Neutro Abs: 3.2 10*3/uL (ref 1.7–7.7)
Neutrophils Relative %: 74 %
Platelet Count: 313 10*3/uL (ref 150–400)
RBC: 2.88 MIL/uL — ABNORMAL LOW (ref 3.87–5.11)
RDW: 14.2 % (ref 11.5–15.5)
WBC Count: 4.4 10*3/uL (ref 4.0–10.5)
nRBC: 0 % (ref 0.0–0.2)

## 2023-02-12 MED ORDER — EPOETIN ALFA-EPBX 40000 UNIT/ML IJ SOLN
40000.0000 [IU] | Freq: Once | INTRAMUSCULAR | Status: AC
  Administered 2023-02-12: 40000 [IU] via SUBCUTANEOUS
  Filled 2023-02-12: qty 1

## 2023-02-12 NOTE — Assessment & Plan Note (Addendum)
Lab review: 12/12/2022: Ferritin 193, iron saturation 13%, hemoglobin 9.5, creatinine 1.42 01/15/2023: Hemoglobin 9.4, MCV 98.3, platelets 317 02/12/2023: Hemoglobin 9.7   anemia of chronic kidney disease stage II-III. Current treatment: Retacrit 40,000 units every 3 weeks started 01/22/2023 Goal hemoglobin: 10.5 g

## 2023-02-12 NOTE — Assessment & Plan Note (Addendum)
04/15/2019:Screening mammogram detected 0.4cm mass in the left breast at the 6 o'clock position with no left axillary adenopathy. Biopsy on 04/03/19 showed IDC, grade 1-2, HER-2 - (0), ER +95%, PR+ 95%, Ki67 15%.  T1 a N0 stage Ia clinical stage   Treatment plan: 1.  Breast conserving surgery with sentinel lymph node biopsy 04/30/2019: Grade 1 IDC 0.8 cm, 0/3 lymph nodes, ER 95%, PR 95%, KI 6715%, HER-2 negative, T1BN0 stage Ia 2.  Adjuvant radiation therapy 06/02/2019- 3.  Follow-up adjuvant antiestrogen therapy with anastrozole 1 mg daily x5 years -------------------------------------------------------------------------------------------------------------------- Current treatment: Adjuvant antiestrogen therapy with anastrozole 1 mg daily started 07/09/2019 (discontinued due to severe joint aches and pains) switched to tamoxifen 10 mg started 01/11/2020 .   Tamoxifen toxicities: Tolerating it extremely well   Breast cancer surveillance: Mammogram 04/04/2022: Benign, breast density category B Breast exam 01/15/2023: Benign

## 2023-02-21 DIAGNOSIS — B349 Viral infection, unspecified: Secondary | ICD-10-CM | POA: Diagnosis not present

## 2023-02-21 DIAGNOSIS — H60501 Unspecified acute noninfective otitis externa, right ear: Secondary | ICD-10-CM | POA: Diagnosis not present

## 2023-02-21 DIAGNOSIS — R509 Fever, unspecified: Secondary | ICD-10-CM | POA: Diagnosis not present

## 2023-02-21 DIAGNOSIS — Z03818 Encounter for observation for suspected exposure to other biological agents ruled out: Secondary | ICD-10-CM | POA: Diagnosis not present

## 2023-02-21 DIAGNOSIS — R5383 Other fatigue: Secondary | ICD-10-CM | POA: Diagnosis not present

## 2023-02-21 DIAGNOSIS — J029 Acute pharyngitis, unspecified: Secondary | ICD-10-CM | POA: Diagnosis not present

## 2023-02-26 ENCOUNTER — Encounter: Payer: Self-pay | Admitting: Genetic Counselor

## 2023-02-26 NOTE — Progress Notes (Signed)
UPDATE: TSC1 p.L439V VUS has been amended to Benign. Amended report date is 08/13/2022.

## 2023-02-27 ENCOUNTER — Other Ambulatory Visit: Payer: Self-pay

## 2023-02-27 ENCOUNTER — Encounter (HOSPITAL_COMMUNITY): Payer: Self-pay

## 2023-02-27 ENCOUNTER — Emergency Department (HOSPITAL_COMMUNITY)
Admission: EM | Admit: 2023-02-27 | Discharge: 2023-02-27 | Disposition: A | Discharge status: Home / Self Care | Payer: Medicare Other | Attending: Emergency Medicine | Admitting: Emergency Medicine

## 2023-02-27 DIAGNOSIS — I10 Essential (primary) hypertension: Secondary | ICD-10-CM | POA: Diagnosis not present

## 2023-02-27 DIAGNOSIS — Z79899 Other long term (current) drug therapy: Secondary | ICD-10-CM | POA: Insufficient documentation

## 2023-02-27 DIAGNOSIS — R55 Syncope and collapse: Secondary | ICD-10-CM | POA: Diagnosis not present

## 2023-02-27 DIAGNOSIS — I959 Hypotension, unspecified: Secondary | ICD-10-CM | POA: Diagnosis not present

## 2023-02-27 DIAGNOSIS — Z853 Personal history of malignant neoplasm of breast: Secondary | ICD-10-CM | POA: Diagnosis not present

## 2023-02-27 DIAGNOSIS — R4182 Altered mental status, unspecified: Secondary | ICD-10-CM | POA: Diagnosis not present

## 2023-02-27 LAB — BASIC METABOLIC PANEL
Anion gap: 9 (ref 5–15)
BUN: 25 mg/dL — ABNORMAL HIGH (ref 8–23)
CO2: 18 mmol/L — ABNORMAL LOW (ref 22–32)
Calcium: 9 mg/dL (ref 8.9–10.3)
Chloride: 105 mmol/L (ref 98–111)
Creatinine, Ser: 1.31 mg/dL — ABNORMAL HIGH (ref 0.44–1.00)
GFR, Estimated: 44 mL/min — ABNORMAL LOW (ref >60)
Glucose, Bld: 130 mg/dL — ABNORMAL HIGH (ref 70–99)
Potassium: 3.7 mmol/L (ref 3.5–5.1)
Sodium: 132 mmol/L — ABNORMAL LOW (ref 135–145)

## 2023-02-27 LAB — CBC WITH DIFFERENTIAL/PLATELET
Abs Immature Granulocytes: 0.03 10*3/uL (ref 0.00–0.07)
Basophils Absolute: 0 10*3/uL (ref 0.0–0.1)
Basophils Relative: 1 %
Eosinophils Absolute: 0 10*3/uL (ref 0.0–0.5)
Eosinophils Relative: 1 %
HCT: 29.4 % — ABNORMAL LOW (ref 36.0–46.0)
Hemoglobin: 9.3 g/dL — ABNORMAL LOW (ref 12.0–15.0)
Immature Granulocytes: 1 %
Lymphocytes Relative: 11 %
Lymphs Abs: 0.5 10*3/uL — ABNORMAL LOW (ref 0.7–4.0)
MCH: 31.4 pg (ref 26.0–34.0)
MCHC: 31.6 g/dL (ref 30.0–36.0)
MCV: 99.3 fL (ref 80.0–100.0)
Monocytes Absolute: 0.3 10*3/uL (ref 0.1–1.0)
Monocytes Relative: 6 %
Neutro Abs: 3.7 10*3/uL (ref 1.7–7.7)
Neutrophils Relative %: 80 %
Platelets: 293 10*3/uL (ref 150–400)
RBC: 2.96 MIL/uL — ABNORMAL LOW (ref 3.87–5.11)
RDW: 14.4 % (ref 11.5–15.5)
WBC: 4.5 10*3/uL (ref 4.0–10.5)
nRBC: 0 % (ref 0.0–0.2)

## 2023-02-27 LAB — CBG MONITORING, ED: Glucose-Capillary: 120 mg/dL — ABNORMAL HIGH (ref 70–99)

## 2023-02-27 MED ORDER — SODIUM CHLORIDE 0.9 % IV BOLUS
1000.0000 mL | Freq: Once | INTRAVENOUS | Status: AC
  Administered 2023-02-27: 1000 mL via INTRAVENOUS

## 2023-02-27 NOTE — ED Triage Notes (Signed)
Pt to er via ems, per ems pt was at the hair salon and passed out.  States that they placed and iv and gave of fluids.  Pt states that she was at the hair salon and the last thing she remember is they asked her a question and she got hot and things started looking funny, states that her bp was 80/50 when they got there.  Pt denies pain at this time, pt oriented times three.

## 2023-02-27 NOTE — ED Provider Notes (Signed)
[place] EMERGENCY DEPARTMENT AT [facility] Provider Note   CSN: [id number] Arrival date & time: 02/27/23  1031     History  Chief Complaint  Patient presents with   Loss of Consciousness    Karen Mills is a 67 y.o. female.  Patient presents to the emergency department via EMS complaining of a syncopal episode.  Patient was sitting at the hair salon and felt hot.  She was reportedly unconscious for maybe 1 to 3 seconds.  EMS arrived and noticed that her blood pressure was 80/50 upon arrival.  They administered 300 mL of normal saline.  Patient currently denies any symptoms.  She denies pain, nausea, vomiting, headache, chest pain, shortness of breath.  Past medical history significant for breast cancer, normocytic normochromic anemia, hypertension  HPI     Home Medications Prior to Admission medications   Medication Sig Start Date End Date Taking? Authorizing Provider  allopurinol (ZYLOPRIM) 300 MG tablet Take 300 mg by mouth every morning. 10/24/21   [provider]  B Complex-C (B-COMPLEX WITH VITAMIN C) tablet  01/04/22   [provider]  Biotin 40981 MCG TABS Take 10,000 mcg by mouth every morning.    [provider]  carvedilol (COREG) 6.25 MG tablet Take 6.25 mg by mouth 2 (two) times daily. 10/24/21   [provider]  cholecalciferol (VITAMIN D3) 25 MCG (1000 UT) tablet Take 1,000 Units by mouth every morning.    [provider]  furosemide (LASIX) 20 MG tablet Take 20 mg by mouth 2 (two) times daily with breakfast and lunch.    [provider]  hydrALAZINE (APRESOLINE) 100 MG tablet Take 100 mg by mouth daily.    [provider]  [place] Lanius 1000 MG CAPS 1 capsule Orally once a day    [provider]  levothyroxine (SYNTHROID) 100 MCG tablet Take 100 mcg by mouth daily before breakfast. 08/08/21   [provider]  liothyronine (CYTOMEL) 5 MCG tablet 1 tablet on an empty stomach  09/22/21   [provider]  Multiple Vitamins-Minerals (HAIR FORMULA EXTRA STRENGTH PO) 4 tablets Orally once a day    [provider]  Omega-3 Fatty Acids (FISH OIL PO) Take 800 mg by mouth every morning.    [provider]  pyridOXINE (VITAMIN B-6) 100 MG tablet Take 100 mg by mouth in the morning.    [provider]  rosuvastatin (CRESTOR) 20 MG tablet Take 1 tablet (20 mg total) by mouth daily. 11/05/22   Orbie Pyo, MD  tamoxifen (NOLVADEX) 10 MG tablet Take 10 mg by mouth. 08/14/21   [provider]  Vitamin E 670 MG (1000 UT) CAPS Take 1,000 Units by mouth every morning.    [provider]      Allergies    Accupril [quinapril hcl], Amlodipine besylate, Atacand [candesartan], Atorvastatin, Cozaar [losartan potassium], Maxzide [hydrochlorothiazide w-triamterene], Tiazac [diltiazem hcl er beads], and Zestril [lisinopril]    Review of Systems   Review of Systems  Physical Exam Updated Vital Signs BP 137/66   Pulse (!) 57   Temp 98.1 F (36.7 C) (Oral)   Resp 18   Ht 5\' 8"  (1.727 m)   Wt 89.8 kg   SpO2 99%   BMI 30.11 kg/m  Physical Exam Vitals and nursing note reviewed.  Constitutional:      General: She is not in acute distress.    Appearance: She is well-developed.  HENT:     Head: Normocephalic  and atraumatic.  Eyes:     Conjunctiva/sclera: Conjunctivae normal.  Cardiovascular:     Rate and Rhythm: Normal rate and regular rhythm.     Heart sounds: No murmur heard. Pulmonary:     Effort: Pulmonary effort is normal. No respiratory distress.     Breath sounds: Normal breath sounds.  Abdominal:     Palpations: Abdomen is soft.     Tenderness: There is no abdominal tenderness.  Musculoskeletal:        General: No swelling.     Cervical back: Neck supple.  Skin:    General: Skin is warm and dry.     Capillary Refill: Capillary refill takes less than 2 seconds.  Neurological:     General: No focal deficit  present.     Mental Status: She is alert and oriented to person, place, and time.  Psychiatric:        Mood and Affect: Mood normal.     ED Results / Procedures / Treatments   Labs (all labs ordered are listed, but only abnormal results are displayed) Labs Reviewed  BASIC METABOLIC PANEL - Abnormal; Notable for the following components:      Result Value   Sodium 132 (*)    CO2 18 (*)    Glucose, Bld 130 (*)    BUN 25 (*)    Creatinine, Ser 1.31 (*)    GFR, Estimated 44 (*)    All other components within normal limits  CBC WITH DIFFERENTIAL/PLATELET - Abnormal; Notable for the following components:   RBC 2.96 (*)    Hemoglobin 9.3 (*)    HCT 29.4 (*)    Lymphs Abs 0.5 (*)    All other components within normal limits  CBG MONITORING, ED - Abnormal; Notable for the following components:   Glucose-Capillary 120 (*)    All other components within normal limits    EKG EKG Interpretation  Date/Time:  Wednesday Feb 27 2023 10:39:33 EDT Ventricular Rate:  58 PR Interval:  175 QRS Duration: 114 QT Interval:  470 QTC Calculation: 462 R Axis:   -6 Text Interpretation: Sinus rhythm Consider left atrial enlargement Incomplete left bundle branch block Probable left ventricular hypertrophy no significant change since Feb 2023 Confirmed by Pricilla Loveless [id number]) on 02/27/2023 10:53:47 AM  Radiology No results found.  Procedures Procedures    Medications Ordered in ED Medications  sodium chloride 0.9 % bolus 1,000 mL (0 mLs Intravenous Stopped 02/27/23 1245)    ED Course/ Medical Decision Making/ A&P                             Medical Decision Making Amount and/or Complexity of Data Reviewed Labs: ordered. ECG/medicine tests: ordered.   This patient presents to the ED for concern of syncope, this involves an extensive number of treatment options, and is a complaint that carries with it a high risk of complications and morbidity.  The differential diagnosis includes  vasovagal syncope, orthostatic changes, dysrhythmia, stroke, electrolyte abnormality, others   Co morbidities that complicate the patient evaluation  Hypertension   Additional history obtained:  Additional history obtained from EMS External records from outside source obtained and reviewed including hematology oncology notes from May 7   Lab Tests:  I Ordered, and personally interpreted labs.  The pertinent results include: Patient with hemoglobin 9.3 at baseline, creatinine 1.31 and BUN 25, consistent with recent results; unremarkable CBG   Imaging Studies ordered:  The  patient denies a headache and did not fall during her syncopal episode.  She has no neurological deficits.  There is no indication at this time for an emergent head CT   Cardiac Monitoring: / EKG:  The patient was maintained on a cardiac monitor.  I personally viewed and interpreted the cardiac monitored which showed an underlying rhythm of: Sinus rhythm    Problem List / ED Course / Critical interventions / Medication management   I ordered medication including normal saline for fluid resuscitation Reevaluation of the patient after these medicines showed that the patient improved I have reviewed the patients home medicines and have made adjustments as needed    Test / Admission - Considered:  Patient with grossly unremarkable workup.  No dysrhythmia on EKG.  No significant lab abnormalities.  No neurologic deficits to suggest TIA or stroke.  Patient with negative orthostatic changes.  Based on patient having something of a prodrome with feeling hot and fuzzy just prior to syncope, feel like this is likely a vasovagal response.  Patient states she has had 1 previous incident that was similar and was also while sitting up at the hair salon.  Plan to discharge home at this time with recommendations for follow-up with primary care as needed.  Return precautions provided         Final Clinical  Impression(s) / ED Diagnoses Final diagnoses:  Syncope, unspecified syncope type    Rx / DC Orders ED Discharge Orders     None         Pamala Duffel 02/27/23 1252    Pricilla Loveless, MD 03/01/23 [phone]

## 2023-02-27 NOTE — Discharge Instructions (Signed)
You were evaluated today after possibly passing out.  Your workup was reassuring.  Your symptoms seem consistent with vasovagal syncope.  Please follow-up with your primary care provider for further evaluation and management as needed.  If you develop any life-threatening symptoms please return to the emergency department.

## 2023-02-28 DIAGNOSIS — H6523 Chronic serous otitis media, bilateral: Secondary | ICD-10-CM | POA: Diagnosis not present

## 2023-02-28 DIAGNOSIS — H900 Conductive hearing loss, bilateral: Secondary | ICD-10-CM | POA: Diagnosis not present

## 2023-02-28 DIAGNOSIS — H6983 Other specified disorders of Eustachian tube, bilateral: Secondary | ICD-10-CM | POA: Diagnosis not present

## 2023-03-05 ENCOUNTER — Inpatient Hospital Stay: Payer: Medicare Other

## 2023-03-05 ENCOUNTER — Other Ambulatory Visit: Payer: Self-pay | Admitting: Obstetrics and Gynecology

## 2023-03-05 VITALS — BP 145/69 | HR 57 | Temp 98.0°F | Resp 20

## 2023-03-05 DIAGNOSIS — D649 Anemia, unspecified: Secondary | ICD-10-CM

## 2023-03-05 DIAGNOSIS — N183 Chronic kidney disease, stage 3 unspecified: Secondary | ICD-10-CM | POA: Diagnosis not present

## 2023-03-05 DIAGNOSIS — Z1231 Encounter for screening mammogram for malignant neoplasm of breast: Secondary | ICD-10-CM

## 2023-03-05 DIAGNOSIS — C50512 Malignant neoplasm of lower-outer quadrant of left female breast: Secondary | ICD-10-CM | POA: Diagnosis not present

## 2023-03-05 DIAGNOSIS — H6522 Chronic serous otitis media, left ear: Secondary | ICD-10-CM | POA: Diagnosis not present

## 2023-03-05 DIAGNOSIS — Z79899 Other long term (current) drug therapy: Secondary | ICD-10-CM | POA: Diagnosis not present

## 2023-03-05 DIAGNOSIS — N182 Chronic kidney disease, stage 2 (mild): Secondary | ICD-10-CM

## 2023-03-05 DIAGNOSIS — H9012 Conductive hearing loss, unilateral, left ear, with unrestricted hearing on the contralateral side: Secondary | ICD-10-CM | POA: Diagnosis not present

## 2023-03-05 DIAGNOSIS — D631 Anemia in chronic kidney disease: Secondary | ICD-10-CM | POA: Diagnosis not present

## 2023-03-05 DIAGNOSIS — Z170 Estrogen receptor positive status [ER+]: Secondary | ICD-10-CM | POA: Diagnosis not present

## 2023-03-05 LAB — CBC WITH DIFFERENTIAL (CANCER CENTER ONLY)
Abs Immature Granulocytes: 0.02 10*3/uL (ref 0.00–0.07)
Basophils Absolute: 0.1 10*3/uL (ref 0.0–0.1)
Basophils Relative: 1 %
Eosinophils Absolute: 0.1 10*3/uL (ref 0.0–0.5)
Eosinophils Relative: 2 %
HCT: 29.7 % — ABNORMAL LOW (ref 36.0–46.0)
Hemoglobin: 9.7 g/dL — ABNORMAL LOW (ref 12.0–15.0)
Immature Granulocytes: 1 %
Lymphocytes Relative: 24 %
Lymphs Abs: 1 10*3/uL (ref 0.7–4.0)
MCH: 31.4 pg (ref 26.0–34.0)
MCHC: 32.7 g/dL (ref 30.0–36.0)
MCV: 96.1 fL (ref 80.0–100.0)
Monocytes Absolute: 0.3 10*3/uL (ref 0.1–1.0)
Monocytes Relative: 8 %
Neutro Abs: 2.7 10*3/uL (ref 1.7–7.7)
Neutrophils Relative %: 64 %
Platelet Count: 393 10*3/uL (ref 150–400)
RBC: 3.09 MIL/uL — ABNORMAL LOW (ref 3.87–5.11)
RDW: 14.1 % (ref 11.5–15.5)
WBC Count: 4.1 10*3/uL (ref 4.0–10.5)
nRBC: 0 % (ref 0.0–0.2)

## 2023-03-05 LAB — CMP (CANCER CENTER ONLY)
ALT: 9 U/L (ref 0–44)
AST: 15 U/L (ref 15–41)
Albumin: 3.6 g/dL (ref 3.5–5.0)
Alkaline Phosphatase: 66 U/L (ref 38–126)
Anion gap: 8 (ref 5–15)
BUN: 19 mg/dL (ref 8–23)
CO2: 20 mmol/L — ABNORMAL LOW (ref 22–32)
Calcium: 9 mg/dL (ref 8.9–10.3)
Chloride: 107 mmol/L (ref 98–111)
Creatinine: 1.1 mg/dL — ABNORMAL HIGH (ref 0.44–1.00)
GFR, Estimated: 55 mL/min — ABNORMAL LOW (ref >60)
Glucose, Bld: 96 mg/dL (ref 70–99)
Potassium: 4.3 mmol/L (ref 3.5–5.1)
Sodium: 135 mmol/L (ref 135–145)
Total Bilirubin: 0.5 mg/dL (ref 0.3–1.2)
Total Protein: 6.7 g/dL (ref 6.5–8.1)

## 2023-03-05 MED ORDER — EPOETIN ALFA-EPBX 40000 UNIT/ML IJ SOLN
40000.0000 [IU] | Freq: Once | INTRAMUSCULAR | Status: AC
  Administered 2023-03-05: 40000 [IU] via SUBCUTANEOUS
  Filled 2023-03-05: qty 1

## 2023-03-09 ENCOUNTER — Other Ambulatory Visit: Payer: Self-pay | Admitting: Hematology and Oncology

## 2023-03-11 ENCOUNTER — Encounter: Payer: Self-pay | Admitting: Hematology and Oncology

## 2023-03-13 NOTE — Progress Notes (Signed)
Patient Care Team: Daisy Floro, MD as PCP - General (Family Medicine) Serena Croissant, MD as Consulting Physician (Hematology and Oncology) Dorothy Puffer, MD as Consulting Physician (Radiation Oncology) Griselda Miner, MD as Consulting Physician (General Surgery)  DIAGNOSIS:  Encounter Diagnoses  Name Primary?   Malignant neoplasm of lower-outer quadrant of left breast of female, estrogen receptor positive (HCC) Yes   Normocytic normochromic anemia     SUMMARY OF ONCOLOGIC HISTORY: Oncology History  Malignant neoplasm of lower-outer quadrant of left breast of female, estrogen receptor positive (HCC)  04/03/2019 Cancer Staging   Staging form: Breast, AJCC 8th Edition - Clinical stage from 04/03/2019: Stage IA (cT1a, cN0, cM0, G2, ER+, PR+, HER2-) - Signed by Loa Socks, NP on 04/15/2019   04/15/2019 Initial Diagnosis   Screening mammogram detected 0.4cm mass in the left breast at the 6 o'clock position with no left axillary adenopathy. Biopsy on 04/03/19 showed IDC, grade 1-2, HER-2 - (0), ER +95%, PR+ 95%, Ki67 15%.    04/30/2019 Surgery   Left lumpectomy Carolynne Edouard): IDC, grade 1, 0.8cm, intermediate grade DCIS, lymphovascular invasion present, clear margins. Three left axillary lymph nodes negative for carcinoma.    06/02/2019 -  Radiation Therapy   Adjuvant radiation   08/2019 -  Anti-estrogen oral therapy   Anastrozole daily stopped 10/2019 due to severe joint pain; switched to tamoxifen 10mg  on 01/11/20   10/06/2021 Genetic Testing   Negative hereditary cancer genetic testing: no pathogenic variants detected in [facility] CancerNext-Expanded +RNAinsight Panel.  Variants of uncertain significance detected in TSC1 at  p.L439V (c.1315C>G) and in TSC2 at p.D624N (c.1870G>A).  The report date is 10/06/2021.  The CancerNext-Expanded gene panel offered by [facility] and includes sequencing, rearrangement, and RNA analysis for the following 77 genes: AIP, ALK, APC, ATM, AXIN2,  BAP1, BARD1, BLM, BMPR1A, BRCA1, BRCA2, BRIP1, CDC73, CDH1, CDK4, CDKN1B, CDKN2A, CHEK2, CTNNA1, DICER1, FANCC, FH, FLCN, GALNT12, KIF1B, LZTR1, MAX, MEN1, MET, MLH1, MSH2, MSH3, MSH6, MUTYH, NBN, NF1, NF2, NTHL1, PALB2, PHOX2B, PMS2, POT1, PRKAR1A, PTCH1, PTEN, RAD51C, RAD51D, RB1, RECQL, RET, SDHA, SDHAF2, SDHB, SDHC, SDHD, SMAD4, SMARCA4, SMARCB1, SMARCE1, STK11, SUFU, TMEM127, TP53, TSC1, TSC2, VHL and XRCC2 (sequencing and deletion/duplication); EGFR, EGLN1, HOXB13, KIT, MITF, PDGFRA, POLD1, and POLE (sequencing only); EPCAM and GREM1 (deletion/duplication only).  UPDATE: TSC1 p.L439V VUS has been amended to Benign. Amended report date is 08/13/2022.     CHIEF COMPLIANT: Breast surveillance on Tamoxifen   INTERVAL HISTORY: Karen Mills is a 67 y.o. with above-mentioned history  normochromic anemia who underwent on antiestrogen therapy with tamoxifen. She presents to the clinic for a follow-up. She reports that she doesn't have the energy that she use to have. She does have lack of interest, not wanting to do much. She denies SOB but feels fatigue that is relieved by rest.  She had 2 episodes of syncope both in the hair salon.  The last episode she had to be taken to the emergency room and was given IV fluids.  Her daughter came along with her and is very concerned about her lack of interest and lack of energy and day-to-day activities.  ALLERGIES:  is allergic to accupril [quinapril hcl], amlodipine besylate, atacand [candesartan], atorvastatin, cozaar [losartan potassium], maxzide [hydrochlorothiazide w-triamterene], tiazac [diltiazem hcl er beads], and zestril [lisinopril].  MEDICATIONS:  Current Outpatient Medications  Medication Sig Dispense Refill   allopurinol (ZYLOPRIM) 300 MG tablet Take 300 mg by mouth every morning.     B Complex-C (B-COMPLEX WITH VITAMIN  C) tablet      Biotin 78295 MCG TABS Take 10,000 mcg by mouth every morning.     carvedilol (COREG) 6.25 MG tablet Take 6.25  mg by mouth 2 (two) times daily.     cholecalciferol (VITAMIN D3) 25 MCG (1000 UT) tablet Take 1,000 Units by mouth every morning.     fluticasone (FLONASE) 50 MCG/ACT nasal spray Place into both nostrils.     hydrALAZINE (APRESOLINE) 100 MG tablet Take 100 mg by mouth daily.     Krill Oil 1000 MG CAPS 1 capsule Orally once a day     levothyroxine (SYNTHROID) 100 MCG tablet Take 100 mcg by mouth daily before breakfast.     Multiple Vitamins-Minerals (HAIR FORMULA EXTRA STRENGTH PO) 4 tablets Orally once a day     Omega-3 Fatty Acids (FISH OIL PO) Take 800 mg by mouth every morning.     pyridOXINE (VITAMIN B-6) 100 MG tablet Take 100 mg by mouth in the morning.     rosuvastatin (CRESTOR) 20 MG tablet Take 1 tablet (20 mg total) by mouth daily. 90 tablet 3   tamoxifen (NOLVADEX) 10 MG tablet TAKE ONE TABLET BY MOUTH DAILY 90 tablet 3   Vitamin E 670 MG (1000 UT) CAPS Take 1,000 Units by mouth every morning.     No current facility-administered medications for this visit.   Facility-Administered Medications Ordered in Other Visits  Medication Dose Route Frequency Provider Last Rate Last Admin   epoetin alfa-epbx (RETACRIT) injection 40,000 Units  40,000 Units Subcutaneous Once Serena Croissant, MD        PHYSICAL EXAMINATION: ECOG PERFORMANCE STATUS: 1 - Symptomatic but completely ambulatory  Vitals:   03/27/23 0830  BP: (!) 170/75  Pulse: 76  Resp: 18  Temp: (!) 97.4 F (36.3 C)  SpO2: 98%   Filed Weights   03/27/23 0830  Weight: 200 lb 11.2 oz (91 kg)      LABORATORY DATA:  I have reviewed the data as listed    Latest Ref Rng & Units 03/27/2023    7:54 AM 03/05/2023    8:10 AM 02/27/2023   10:41 AM  CMP  Glucose 70 - 99 mg/dL 621  96  308   BUN 8 - 23 mg/dL 20  19  25    Creatinine 0.44 - 1.00 mg/dL 6.57  8.46  9.62   Sodium 135 - 145 mmol/L 134  135  132   Potassium 3.5 - 5.1 mmol/L 3.9  4.3  3.7   Chloride 98 - 111 mmol/L 105  107  105   CO2 22 - 32 mmol/L 22  20  18     Calcium 8.9 - 10.3 mg/dL 9.5  9.0  9.0   Total Protein 6.5 - 8.1 g/dL 6.6  6.7    Total Bilirubin 0.3 - 1.2 mg/dL 0.7  0.5    Alkaline Phos 38 - 126 U/L 100  66    AST 15 - 41 U/L 24  15    ALT 0 - 44 U/L 19  9      Lab Results  Component Value Date   WBC 3.6 (L) 03/27/2023   HGB 10.0 (L) 03/27/2023   HCT 31.3 (L) 03/27/2023   MCV 98.4 03/27/2023   PLT 267 03/27/2023   NEUTROABS 2.6 03/27/2023    ASSESSMENT & PLAN:  Malignant neoplasm of lower-outer quadrant of left breast of female, estrogen receptor positive (HCC) 04/15/2019:Screening mammogram detected 0.4cm mass in the left breast at the 6 o'clock  position with no left axillary adenopathy. Biopsy on 04/03/19 showed IDC, grade 1-2, HER-2 - (0), ER +95%, PR+ 95%, Ki67 15%.  T1 a N0 stage Ia clinical stage   Treatment plan: 1.  Breast conserving surgery with sentinel lymph node biopsy 04/30/2019: Grade 1 IDC 0.8 cm, 0/3 lymph nodes, ER 95%, PR 95%, KI 6715%, HER-2 negative, T1BN0 stage Ia 2.  Adjuvant radiation therapy 06/02/2019- 3.  Follow-up adjuvant antiestrogen therapy with anastrozole 1 mg daily x5 years -------------------------------------------------------------------------------------------------------------------- Current treatment: Adjuvant antiestrogen therapy with anastrozole 1 mg daily started 07/09/2019 (discontinued due to severe joint aches and pains) switched to tamoxifen 10 mg started 01/11/2020 .   Tamoxifen toxicities: Tolerating it extremely well   Breast cancer surveillance: Mammogram 04/04/2022: Benign, breast density category B next mammogram scheduled for 04/09/2023 Breast exam 01/15/2023: Benign  Normocytic normochromic anemia Lab review: 12/12/2022: Ferritin 193, iron saturation 13%, hemoglobin 9.5, creatinine 1.42 01/15/2023: Hemoglobin 9.4, MCV 98.3, platelets 317 02/12/2023: Hemoglobin 9.7 03/27/2023: Hemoglobin 10   anemia of chronic kidney disease stage II-III. Current treatment: Retacrit 40,000 units  every 3 weeks started 01/22/2023 Goal hemoglobin: 10.5 g   Follow-up every 3 weeks for lab and injections and then in 9 weeks for follow-up with me    Orders Placed This Encounter  Procedures   CBC with Differential (Cancer Center Only)    Standing Status:   Future    Standing Expiration Date:   03/26/2024   Ferritin    Standing Status:   Future    Standing Expiration Date:   03/26/2024   Iron and Iron Binding Capacity ([facility] only)    Standing Status:   Future    Standing Expiration Date:   03/26/2024   Ambulatory referral to Nephrology    Referral Priority:   Routine    Referral Type:   Consultation    Referral Reason:   Specialty Services Required    Referred to Provider:   Dagoberto Ligas, MD    Requested Specialty:   Nephrology    Number of Visits Requested:   1   The patient has a good understanding of the overall plan. she agrees with it. she will call with any problems that may develop before the next visit here. Total time spent: 30 mins including face to face time and time spent for planning, charting and co-ordination of care   Tamsen Meek, MD 03/27/23    I Janan Ridge am acting as a Neurosurgeon for [employer]  I have reviewed the above documentation for accuracy and completeness, and I agree with the above.

## 2023-03-26 ENCOUNTER — Inpatient Hospital Stay: Payer: Medicare Other

## 2023-03-27 ENCOUNTER — Other Ambulatory Visit: Payer: Self-pay

## 2023-03-27 ENCOUNTER — Inpatient Hospital Stay: Payer: Medicare Other | Admitting: Hematology and Oncology

## 2023-03-27 ENCOUNTER — Inpatient Hospital Stay: Payer: Medicare Other | Attending: Hematology and Oncology

## 2023-03-27 ENCOUNTER — Inpatient Hospital Stay: Payer: Medicare Other

## 2023-03-27 VITALS — BP 170/75 | HR 76 | Temp 97.4°F | Resp 18 | Ht 68.0 in | Wt 200.7 lb

## 2023-03-27 DIAGNOSIS — Z170 Estrogen receptor positive status [ER+]: Secondary | ICD-10-CM | POA: Diagnosis not present

## 2023-03-27 DIAGNOSIS — N182 Chronic kidney disease, stage 2 (mild): Secondary | ICD-10-CM

## 2023-03-27 DIAGNOSIS — N183 Chronic kidney disease, stage 3 unspecified: Secondary | ICD-10-CM | POA: Insufficient documentation

## 2023-03-27 DIAGNOSIS — C50512 Malignant neoplasm of lower-outer quadrant of left female breast: Secondary | ICD-10-CM

## 2023-03-27 DIAGNOSIS — D649 Anemia, unspecified: Secondary | ICD-10-CM

## 2023-03-27 DIAGNOSIS — D631 Anemia in chronic kidney disease: Secondary | ICD-10-CM | POA: Diagnosis not present

## 2023-03-27 LAB — CBC WITH DIFFERENTIAL (CANCER CENTER ONLY)
Abs Immature Granulocytes: 0.01 10*3/uL (ref 0.00–0.07)
Basophils Absolute: 0 10*3/uL (ref 0.0–0.1)
Basophils Relative: 1 %
Eosinophils Absolute: 0 10*3/uL (ref 0.0–0.5)
Eosinophils Relative: 1 %
HCT: 31.3 % — ABNORMAL LOW (ref 36.0–46.0)
Hemoglobin: 10 g/dL — ABNORMAL LOW (ref 12.0–15.0)
Immature Granulocytes: 0 %
Lymphocytes Relative: 20 %
Lymphs Abs: 0.7 10*3/uL (ref 0.7–4.0)
MCH: 31.4 pg (ref 26.0–34.0)
MCHC: 31.9 g/dL (ref 30.0–36.0)
MCV: 98.4 fL (ref 80.0–100.0)
Monocytes Absolute: 0.2 10*3/uL (ref 0.1–1.0)
Monocytes Relative: 6 %
Neutro Abs: 2.6 10*3/uL (ref 1.7–7.7)
Neutrophils Relative %: 72 %
Platelet Count: 267 10*3/uL (ref 150–400)
RBC: 3.18 MIL/uL — ABNORMAL LOW (ref 3.87–5.11)
RDW: 15.1 % (ref 11.5–15.5)
WBC Count: 3.6 10*3/uL — ABNORMAL LOW (ref 4.0–10.5)
nRBC: 0 % (ref 0.0–0.2)

## 2023-03-27 LAB — CMP (CANCER CENTER ONLY)
ALT: 19 U/L (ref 0–44)
AST: 24 U/L (ref 15–41)
Albumin: 3.5 g/dL (ref 3.5–5.0)
Alkaline Phosphatase: 100 U/L (ref 38–126)
Anion gap: 7 (ref 5–15)
BUN: 20 mg/dL (ref 8–23)
CO2: 22 mmol/L (ref 22–32)
Calcium: 9.5 mg/dL (ref 8.9–10.3)
Chloride: 105 mmol/L (ref 98–111)
Creatinine: 1.19 mg/dL — ABNORMAL HIGH (ref 0.44–1.00)
GFR, Estimated: 50 mL/min — ABNORMAL LOW (ref >60)
Glucose, Bld: 126 mg/dL — ABNORMAL HIGH (ref 70–99)
Potassium: 3.9 mmol/L (ref 3.5–5.1)
Sodium: 134 mmol/L — ABNORMAL LOW (ref 135–145)
Total Bilirubin: 0.7 mg/dL (ref 0.3–1.2)
Total Protein: 6.6 g/dL (ref 6.5–8.1)

## 2023-03-27 MED ORDER — EPOETIN ALFA-EPBX 40000 UNIT/ML IJ SOLN
40000.0000 [IU] | Freq: Once | INTRAMUSCULAR | Status: AC
  Administered 2023-03-27: 40000 [IU] via SUBCUTANEOUS
  Filled 2023-03-27: qty 1

## 2023-03-27 NOTE — Assessment & Plan Note (Signed)
Lab review: 12/12/2022: Ferritin 193, iron saturation 13%, hemoglobin 9.5, creatinine 1.42 01/15/2023: Hemoglobin 9.4, MCV 98.3, platelets 317 02/12/2023: Hemoglobin 9.7 03/27/2023:   anemia of chronic kidney disease stage II-III. Current treatment: Retacrit 40,000 units every 3 weeks started 01/22/2023 Goal hemoglobin: 10.5 g   Follow-up every 3 weeks for lab and injections and then in 9 weeks for follow-up with me

## 2023-03-27 NOTE — Assessment & Plan Note (Signed)
04/15/2019:Screening mammogram detected 0.4cm mass in the left breast at the 6 o'clock position with no left axillary adenopathy. Biopsy on 04/03/19 showed IDC, grade 1-2, HER-2 - (0), ER +95%, PR+ 95%, Ki67 15%.  T1 a N0 stage Ia clinical stage   Treatment plan: 1.  Breast conserving surgery with sentinel lymph node biopsy 04/30/2019: Grade 1 IDC 0.8 cm, 0/3 lymph nodes, ER 95%, PR 95%, KI 6715%, HER-2 negative, T1BN0 stage Ia 2.  Adjuvant radiation therapy 06/02/2019- 3.  Follow-up adjuvant antiestrogen therapy with anastrozole 1 mg daily x5 years -------------------------------------------------------------------------------------------------------------------- Current treatment: Adjuvant antiestrogen therapy with anastrozole 1 mg daily started 07/09/2019 (discontinued due to severe joint aches and pains) switched to tamoxifen 10 mg started 01/11/2020 .   Tamoxifen toxicities: Tolerating it extremely well   Breast cancer surveillance: Mammogram 04/04/2022: Benign, breast density category B next mammogram scheduled for 04/09/2023 Breast exam 01/15/2023: Benign

## 2023-03-28 DIAGNOSIS — C50512 Malignant neoplasm of lower-outer quadrant of left female breast: Secondary | ICD-10-CM | POA: Diagnosis not present

## 2023-03-28 DIAGNOSIS — Z170 Estrogen receptor positive status [ER+]: Secondary | ICD-10-CM | POA: Diagnosis not present

## 2023-04-05 DIAGNOSIS — H903 Sensorineural hearing loss, bilateral: Secondary | ICD-10-CM | POA: Diagnosis not present

## 2023-04-05 DIAGNOSIS — H7202 Central perforation of tympanic membrane, left ear: Secondary | ICD-10-CM | POA: Diagnosis not present

## 2023-04-09 ENCOUNTER — Ambulatory Visit
Admission: RE | Admit: 2023-04-09 | Discharge: 2023-04-09 | Disposition: A | Discharge status: Home / Self Care | Payer: Medicare Other | Source: Ambulatory Visit | Attending: Obstetrics and Gynecology | Admitting: Obstetrics and Gynecology

## 2023-04-09 DIAGNOSIS — Z1231 Encounter for screening mammogram for malignant neoplasm of breast: Secondary | ICD-10-CM | POA: Diagnosis not present

## 2023-04-16 ENCOUNTER — Inpatient Hospital Stay: Payer: Medicare Other | Admitting: Hematology and Oncology

## 2023-04-16 ENCOUNTER — Inpatient Hospital Stay: Payer: Medicare Other

## 2023-04-16 ENCOUNTER — Other Ambulatory Visit: Payer: Self-pay

## 2023-04-16 ENCOUNTER — Inpatient Hospital Stay: Payer: Medicare Other | Attending: Hematology and Oncology

## 2023-04-16 VITALS — BP 142/58 | HR 61 | Temp 98.3°F | Resp 18

## 2023-04-16 DIAGNOSIS — D631 Anemia in chronic kidney disease: Secondary | ICD-10-CM | POA: Insufficient documentation

## 2023-04-16 DIAGNOSIS — N183 Chronic kidney disease, stage 3 unspecified: Secondary | ICD-10-CM | POA: Diagnosis not present

## 2023-04-16 DIAGNOSIS — D649 Anemia, unspecified: Secondary | ICD-10-CM

## 2023-04-16 DIAGNOSIS — N182 Chronic kidney disease, stage 2 (mild): Secondary | ICD-10-CM

## 2023-04-16 LAB — FERRITIN: Ferritin: 151 ng/mL (ref 11–307)

## 2023-04-16 LAB — CMP (CANCER CENTER ONLY)
ALT: 9 U/L (ref 0–44)
AST: 17 U/L (ref 15–41)
Albumin: 3.4 g/dL — ABNORMAL LOW (ref 3.5–5.0)
Alkaline Phosphatase: 83 U/L (ref 38–126)
Anion gap: 8 (ref 5–15)
BUN: 27 mg/dL — ABNORMAL HIGH (ref 8–23)
CO2: 22 mmol/L (ref 22–32)
Calcium: 9.5 mg/dL (ref 8.9–10.3)
Chloride: 100 mmol/L (ref 98–111)
Creatinine: 1.22 mg/dL — ABNORMAL HIGH (ref 0.44–1.00)
GFR, Estimated: 48 mL/min — ABNORMAL LOW (ref >60)
Glucose, Bld: 84 mg/dL (ref 70–99)
Potassium: 4.2 mmol/L (ref 3.5–5.1)
Sodium: 130 mmol/L — ABNORMAL LOW (ref 135–145)
Total Bilirubin: 0.5 mg/dL (ref 0.3–1.2)
Total Protein: 6.9 g/dL (ref 6.5–8.1)

## 2023-04-16 LAB — CBC WITH DIFFERENTIAL (CANCER CENTER ONLY)
Abs Immature Granulocytes: 0.01 10*3/uL (ref 0.00–0.07)
Basophils Absolute: 0 10*3/uL (ref 0.0–0.1)
Basophils Relative: 2 %
Eosinophils Absolute: 0 10*3/uL (ref 0.0–0.5)
Eosinophils Relative: 2 %
HCT: 30 % — ABNORMAL LOW (ref 36.0–46.0)
Hemoglobin: 10 g/dL — ABNORMAL LOW (ref 12.0–15.0)
Immature Granulocytes: 0 %
Lymphocytes Relative: 25 %
Lymphs Abs: 0.7 10*3/uL (ref 0.7–4.0)
MCH: 31.7 pg (ref 26.0–34.0)
MCHC: 33.3 g/dL (ref 30.0–36.0)
MCV: 95.2 fL (ref 80.0–100.0)
Monocytes Absolute: 0.2 10*3/uL (ref 0.1–1.0)
Monocytes Relative: 8 %
Neutro Abs: 1.7 10*3/uL (ref 1.7–7.7)
Neutrophils Relative %: 63 %
Platelet Count: 287 10*3/uL (ref 150–400)
RBC: 3.15 MIL/uL — ABNORMAL LOW (ref 3.87–5.11)
RDW: 15 % (ref 11.5–15.5)
WBC Count: 2.6 10*3/uL — ABNORMAL LOW (ref 4.0–10.5)
nRBC: 0 % (ref 0.0–0.2)

## 2023-04-16 LAB — IRON AND IRON BINDING CAPACITY (CC-WL,HP ONLY)
Iron: 45 ug/dL (ref 28–170)
Saturation Ratios: 17 % (ref 10.4–31.8)
TIBC: 267 ug/dL (ref 250–450)
UIBC: 222 ug/dL (ref 148–442)

## 2023-04-16 MED ORDER — EPOETIN ALFA-EPBX 40000 UNIT/ML IJ SOLN
40000.0000 [IU] | Freq: Once | INTRAMUSCULAR | Status: AC
  Administered 2023-04-16: 40000 [IU] via SUBCUTANEOUS
  Filled 2023-04-16: qty 1

## 2023-05-05 NOTE — Progress Notes (Signed)
Patient Care Team: Daisy Floro, MD as PCP - General (Family Medicine) Serena Croissant, MD as Consulting Physician (Hematology and Oncology) Dorothy Puffer, MD as Consulting Physician (Radiation Oncology) Griselda Miner, MD as Consulting Physician (General Surgery)  DIAGNOSIS: No diagnosis found.  SUMMARY OF ONCOLOGIC HISTORY: Oncology History  Malignant neoplasm of lower-outer quadrant of left breast of female, estrogen receptor positive (HCC)  04/03/2019 Cancer Staging   Staging form: Breast, AJCC 8th Edition - Clinical stage from 04/03/2019: Stage IA (cT1a, cN0, cM0, G2, ER+, PR+, HER2-) - Signed by Loa Socks, NP on 04/15/2019   04/15/2019 Initial Diagnosis   Screening mammogram detected 0.4cm mass in the left breast at the 6 o'clock position with no left axillary adenopathy. Biopsy on 04/03/19 showed IDC, grade 1-2, HER-2 - (0), ER +95%, PR+ 95%, Ki67 15%.    04/30/2019 Surgery   Left lumpectomy Carolynne Edouard): IDC, grade 1, 0.8cm, intermediate grade DCIS, lymphovascular invasion present, clear margins. Three left axillary lymph nodes negative for carcinoma.    06/02/2019 -  Radiation Therapy   Adjuvant radiation   08/2019 -  Anti-estrogen oral therapy   Anastrozole daily stopped 10/2019 due to severe joint pain; switched to tamoxifen 10mg  on 01/11/20   10/06/2021 Genetic Testing   Negative hereditary cancer genetic testing: no pathogenic variants detected in [facility] CancerNext-Expanded +RNAinsight Panel.  Variants of uncertain significance detected in TSC1 at  p.L439V (c.1315C>G) and in TSC2 at p.D624N (c.1870G>A).  The report date is 10/06/2021.  The CancerNext-Expanded gene panel offered by [facility] and includes sequencing, rearrangement, and RNA analysis for the following 77 genes: AIP, ALK, APC, ATM, AXIN2, BAP1, BARD1, BLM, BMPR1A, BRCA1, BRCA2, BRIP1, CDC73, CDH1, CDK4, CDKN1B, CDKN2A, CHEK2, CTNNA1, DICER1, FANCC, FH, FLCN, GALNT12, KIF1B, LZTR1, MAX, MEN1, MET, MLH1,  MSH2, MSH3, MSH6, MUTYH, NBN, NF1, NF2, NTHL1, PALB2, PHOX2B, PMS2, POT1, PRKAR1A, PTCH1, PTEN, RAD51C, RAD51D, RB1, RECQL, RET, SDHA, SDHAF2, SDHB, SDHC, SDHD, SMAD4, SMARCA4, SMARCB1, SMARCE1, STK11, SUFU, TMEM127, TP53, TSC1, TSC2, VHL and XRCC2 (sequencing and deletion/duplication); EGFR, EGLN1, HOXB13, KIT, MITF, PDGFRA, POLD1, and POLE (sequencing only); EPCAM and GREM1 (deletion/duplication only).  UPDATE: TSC1 p.L439V VUS has been amended to Benign. Amended report date is 08/13/2022.     CHIEF COMPLIANT: Breast surveillance on Tamoxifen     INTERVAL HISTORY: Karen Mills is a  67 y.o. with above-mentioned history  normochromic anemia who underwent on antiestrogen therapy with tamoxifen. She presents to the clinic for a follow-up.   ALLERGIES:  is allergic to accupril [quinapril hcl], amlodipine besylate, atacand [candesartan], atorvastatin, cozaar [losartan potassium], maxzide [hydrochlorothiazide w-triamterene], tiazac [diltiazem hcl er beads], and zestril [lisinopril].  MEDICATIONS:  Current Outpatient Medications  Medication Sig Dispense Refill   allopurinol (ZYLOPRIM) 300 MG tablet Take 300 mg by mouth every morning.     B Complex-C (B-COMPLEX WITH VITAMIN C) tablet      Biotin 16109 MCG TABS Take 10,000 mcg by mouth every morning.     carvedilol (COREG) 6.25 MG tablet Take 6.25 mg by mouth 2 (two) times daily.     cholecalciferol (VITAMIN D3) 25 MCG (1000 UT) tablet Take 1,000 Units by mouth every morning.     fluticasone (FLONASE) 50 MCG/ACT nasal spray Place into both nostrils.     hydrALAZINE (APRESOLINE) 100 MG tablet Take 100 mg by mouth daily.     Krill Oil 1000 MG CAPS 1 capsule Orally once a day     levothyroxine (SYNTHROID) 100 MCG tablet Take 100 mcg by  mouth daily before breakfast.     Multiple Vitamins-Minerals (HAIR FORMULA EXTRA STRENGTH PO) 4 tablets Orally once a day     Omega-3 Fatty Acids (FISH OIL PO) Take 800 mg by mouth every morning.     pyridOXINE  (VITAMIN B-6) 100 MG tablet Take 100 mg by mouth in the morning.     rosuvastatin (CRESTOR) 20 MG tablet Take 1 tablet (20 mg total) by mouth daily. 90 tablet 3   tamoxifen (NOLVADEX) 10 MG tablet TAKE ONE TABLET BY MOUTH DAILY 90 tablet 3   Vitamin E 670 MG (1000 UT) CAPS Take 1,000 Units by mouth every morning.     No current facility-administered medications for this visit.    PHYSICAL EXAMINATION: ECOG PERFORMANCE STATUS: {CHL ONC ECOG PS:[phone]}  There were no vitals filed for this visit. There were no vitals filed for this visit.  BREAST:*** No palpable masses or nodules in either right or left breasts. No palpable axillary supraclavicular or infraclavicular adenopathy no breast tenderness or nipple discharge. (exam performed in the presence of a chaperone)  LABORATORY DATA:  I have reviewed the data as listed    Latest Ref Rng & Units 04/16/2023    8:33 AM 03/27/2023    7:54 AM 03/05/2023    8:10 AM  CMP  Glucose 70 - 99 mg/dL 84  161  96   BUN 8 - 23 mg/dL 27  20  19    Creatinine 0.44 - 1.00 mg/dL 0.96  0.45  4.09   Sodium 135 - 145 mmol/L 130  134  135   Potassium 3.5 - 5.1 mmol/L 4.2  3.9  4.3   Chloride 98 - 111 mmol/L 100  105  107   CO2 22 - 32 mmol/L 22  22  20    Calcium 8.9 - 10.3 mg/dL 9.5  9.5  9.0   Total Protein 6.5 - 8.1 g/dL 6.9  6.6  6.7   Total Bilirubin 0.3 - 1.2 mg/dL 0.5  0.7  0.5   Alkaline Phos 38 - 126 U/L 83  100  66   AST 15 - 41 U/L 17  24  15    ALT 0 - 44 U/L 9  19  9      Lab Results  Component Value Date   WBC 2.6 (L) 04/16/2023   HGB 10.0 (L) 04/16/2023   HCT 30.0 (L) 04/16/2023   MCV 95.2 04/16/2023   PLT 287 04/16/2023   NEUTROABS 1.7 04/16/2023    ASSESSMENT & PLAN:  No problem-specific Assessment & Plan notes found for this encounter.    No orders of the defined types were placed in this encounter.  The patient has a good understanding of the overall plan. she agrees with it. she will call with any problems that may  develop before the next visit here. Total time spent: 30 mins including face to face time and time spent for planning, charting and co-ordination of care   Sherlyn Lick, CMA 05/05/23    I Janan Ridge am acting as a Neurosurgeon for [employer]  ***

## 2023-05-07 ENCOUNTER — Other Ambulatory Visit: Payer: Self-pay

## 2023-05-07 ENCOUNTER — Telehealth: Payer: Self-pay

## 2023-05-07 ENCOUNTER — Inpatient Hospital Stay: Payer: Medicare Other

## 2023-05-07 ENCOUNTER — Inpatient Hospital Stay: Payer: Medicare Other | Admitting: Hematology and Oncology

## 2023-05-07 VITALS — BP 174/77 | HR 74 | Temp 97.2°F | Resp 18 | Ht 68.0 in | Wt 204.6 lb

## 2023-05-07 DIAGNOSIS — D649 Anemia, unspecified: Secondary | ICD-10-CM

## 2023-05-07 DIAGNOSIS — C50512 Malignant neoplasm of lower-outer quadrant of left female breast: Secondary | ICD-10-CM

## 2023-05-07 DIAGNOSIS — Z170 Estrogen receptor positive status [ER+]: Secondary | ICD-10-CM | POA: Diagnosis not present

## 2023-05-07 DIAGNOSIS — N183 Chronic kidney disease, stage 3 unspecified: Secondary | ICD-10-CM | POA: Diagnosis not present

## 2023-05-07 DIAGNOSIS — N182 Chronic kidney disease, stage 2 (mild): Secondary | ICD-10-CM

## 2023-05-07 DIAGNOSIS — D631 Anemia in chronic kidney disease: Secondary | ICD-10-CM | POA: Diagnosis not present

## 2023-05-07 LAB — CBC WITH DIFFERENTIAL (CANCER CENTER ONLY)
Abs Immature Granulocytes: 0.01 10*3/uL (ref 0.00–0.07)
Basophils Absolute: 0.1 10*3/uL (ref 0.0–0.1)
Basophils Relative: 1 %
Eosinophils Absolute: 0 10*3/uL (ref 0.0–0.5)
Eosinophils Relative: 1 %
HCT: 30.9 % — ABNORMAL LOW (ref 36.0–46.0)
Hemoglobin: 10.5 g/dL — ABNORMAL LOW (ref 12.0–15.0)
Immature Granulocytes: 0 %
Lymphocytes Relative: 16 %
Lymphs Abs: 0.6 10*3/uL — ABNORMAL LOW (ref 0.7–4.0)
MCH: 31.8 pg (ref 26.0–34.0)
MCHC: 34 g/dL (ref 30.0–36.0)
MCV: 93.6 fL (ref 80.0–100.0)
Monocytes Absolute: 0.3 10*3/uL (ref 0.1–1.0)
Monocytes Relative: 7 %
Neutro Abs: 2.9 10*3/uL (ref 1.7–7.7)
Neutrophils Relative %: 75 %
Platelet Count: 288 10*3/uL (ref 150–400)
RBC: 3.3 MIL/uL — ABNORMAL LOW (ref 3.87–5.11)
RDW: 14.8 % (ref 11.5–15.5)
WBC Count: 3.9 10*3/uL — ABNORMAL LOW (ref 4.0–10.5)
nRBC: 0 % (ref 0.0–0.2)

## 2023-05-07 LAB — CMP (CANCER CENTER ONLY)
ALT: 10 U/L (ref 0–44)
AST: 17 U/L (ref 15–41)
Albumin: 3.7 g/dL (ref 3.5–5.0)
Alkaline Phosphatase: 75 U/L (ref 38–126)
Anion gap: 8 (ref 5–15)
BUN: 23 mg/dL (ref 8–23)
CO2: 21 mmol/L — ABNORMAL LOW (ref 22–32)
Calcium: 9.4 mg/dL (ref 8.9–10.3)
Chloride: 100 mmol/L (ref 98–111)
Creatinine: 1.07 mg/dL — ABNORMAL HIGH (ref 0.44–1.00)
GFR, Estimated: 57 mL/min — ABNORMAL LOW (ref >60)
Glucose, Bld: 88 mg/dL (ref 70–99)
Potassium: 3.9 mmol/L (ref 3.5–5.1)
Sodium: 129 mmol/L — ABNORMAL LOW (ref 135–145)
Total Bilirubin: 0.6 mg/dL (ref 0.3–1.2)
Total Protein: 6.9 g/dL (ref 6.5–8.1)

## 2023-05-07 MED ORDER — EPOETIN ALFA-EPBX 40000 UNIT/ML IJ SOLN
40000.0000 [IU] | Freq: Once | INTRAMUSCULAR | Status: AC
  Administered 2023-05-07: 40000 [IU] via SUBCUTANEOUS
  Filled 2023-05-07: qty 1

## 2023-05-07 MED ORDER — AMOXICILLIN 500 MG PO CAPS: 500.0000 mg | ORAL_CAPSULE | Freq: Two times a day (BID) | ORAL | 0 refills | Status: DC

## 2023-05-07 NOTE — Assessment & Plan Note (Signed)
04/15/2019:Screening mammogram detected 0.4cm mass in the left breast at the 6 o'clock position with no left axillary adenopathy. Biopsy on 04/03/19 showed IDC, grade 1-2, HER-2 - (0), ER +95%, PR+ 95%, Ki67 15%.  T1 a N0 stage Ia clinical stage   Treatment plan: 1.  Breast conserving surgery with sentinel lymph node biopsy 04/30/2019: Grade 1 IDC 0.8 cm, 0/3 lymph nodes, ER 95%, PR 95%, KI 6715%, HER-2 negative, T1BN0 stage Ia 2.  Adjuvant radiation therapy 06/02/2019- 3.  Follow-up adjuvant antiestrogen therapy with anastrozole 1 mg daily x5 years -------------------------------------------------------------------------------------------------------------------- Current treatment: Adjuvant antiestrogen therapy with anastrozole 1 mg daily started 07/09/2019 (discontinued due to severe joint aches and pains) switched to tamoxifen 10 mg started 01/11/2020 .   Tamoxifen toxicities: Tolerating it extremely well   Breast cancer surveillance: Mammogram 04/10/2023: Benign, breast density category B Breast exam 01/15/2023: Benign   Normocytic normochromic anemia Lab review: 12/12/2022: Ferritin 193, iron saturation 13%, hemoglobin 9.5, creatinine 1.42 01/15/2023: Hemoglobin 9.4, MCV 98.3, platelets 317 02/12/2023: Hemoglobin 9.7 03/27/2023: Hemoglobin 10 05/07/2023: Hemoglobin   anemia of chronic kidney disease stage II-III. Current treatment: Retacrit 40,000 units every 3 weeks started 01/22/2023 Goal hemoglobin: 10.5 g   Follow-up every 3 weeks for lab and injections and then in 9 weeks for follow-up with me

## 2023-05-07 NOTE — Telephone Encounter (Signed)
Nephrology referral and all supporting documents faxed to Dr Allena Katz At [facility] per MD at [phone]. Fax confirmation received.

## 2023-05-13 NOTE — Progress Notes (Unsigned)
Cardiology Office Note:  .   Date:  05/14/2023  ID:  Karen Mills, DOB [birth year], MRN [id number] PCP: Daisy Floro, MD  [facility] Providers Cardiologist:  None {  History of Present Illness: Karen Mills is a 67 y.o. female with past medical history of coronary artery disease, hyperlipidemia, hypertension, chronic kidney disease, and fatigue who presents for a routine follow-up appointment.  Patient was referred to lipid clinic and was seen 08/27/2022.  No acute concerns at that time.  Getting over a common cold.  Stopped taking simvastatin several months prior and labs reflect LDL well patient is only OTC fish oil 800 mg daily.  Atorvastatin 20 mg 2017 which she tolerated well.  Earlier in 2023 she was started on atorvastatin 40 mg and unable to tolerate due to severe muscle and joint pain.  Experienced same type of pain/symptoms from simvastatin 40 mg dose.  Plan was to start rosuvastatin 10 mg daily and if unable to tolerate plan to try 10 mg every other day or 5 daily.  Today, she tells me that she is on Crestor 20 mg daily without any side effects.  LDL is at goal.  She does have a murmur on exam today.  Likely due to her aortic sclerosis without stenosis.  Reviewed her echocardiogram from 10/2021 without any significant valvular disease.  Occasionally she has some skipped beats here and there but she is asymptomatic at this time.  She does suffer from arthritis in her knees and hips with bursitis in her hips.  Blood pressure well-controlled today.  She states she has been on blood pressure medicine for a long time and has been on a total of 11 different medications before settling on her current regimen.  Would not make changes today.  Reports no shortness of breath nor dyspnea on exertion. Reports no chest pain, pressure, or tightness. No edema, orthopnea, PND. Reports no palpitations.    ROS: Pertinent ROS in HPI  Studies Reviewed: Marland Kitchen        TTE 2023:  1.  Left ventricular ejection fraction by 3D volume is 63 %. The left  ventricle has normal function. The left ventricle has no regional wall  motion abnormalities. There is mild concentric left ventricular  hypertrophy. Left ventricular diastolic  parameters are consistent with Grade I diastolic dysfunction (impaired  relaxation). The average left ventricular global longitudinal strain is  -25.7 %. The global longitudinal strain is normal.   2. Right ventricular systolic function is normal. The right ventricular  size is normal.   3. Left atrial size was mildly dilated.   4. The mitral valve is normal in structure. No evidence of mitral valve  regurgitation. No evidence of mitral stenosis.   5. The aortic valve is normal in structure. Aortic valve regurgitation is  trivial. Aortic valve sclerosis is present, with no evidence of aortic  valve stenosis.   6. The inferior vena cava is normal in size with greater than 50%  respiratory variability, suggesting right atrial pressure of 3 mmHg.    Calcium score CT 2023: Coronary calcium score of 342. This was 92nd percentile for age-, race-, and sex-matched controls. Aortic atherosclerosis.   Other studies Reviewed: Review of the additional studies/records demonstrates: None relevant      Physical Exam:   VS:  BP 132/62   Pulse 62   Ht 5\' 8"  (1.727 m)   Wt 206 lb 3.2 oz (93.5 kg)   SpO2 98%  BMI 31.35 kg/m    Wt Readings from Last 3 Encounters:  05/14/23 206 lb 3.2 oz (93.5 kg)  05/07/23 204 lb 9.6 oz (92.8 kg)  03/27/23 200 lb 11.2 oz (91 kg)    GEN: Well nourished, well developed in no acute distress NECK: No JVD; No carotid bruits CARDIAC: RRR, + systolic murmurs, rubs, gallops RESPIRATORY:  Clear to auscultation without rales, wheezing or rhonchi  ABDOMEN: Soft, non-tender, non-distended EXTREMITIES:  No edema; No deformity   ASSESSMENT AND PLAN: .   1.  Coronary artery disease -elevated calcium score -Continue carvedilol  6.25 mg twice daily, Krill oil 1000 mg daily and crestor 20mg  daily  2.  Aortic atherosclerosis/Hyperlipidemia -LDL 56, at goal -Would continue current cholesterol regimen including Crestor 20 mg daily, tolerating well -Continue low-sodium, Mediterranean diet  3.  Hypertension -Blood pressure well-controlled today, 128/48 (132/62 on repeat) -Would continue current medication regimen including carvedilol 6.25 mg twice a day, hydralazine 100 mg daily -She tells me that she has been on 11 different medications in the past trying to get her blood pressure well-controlled and she either had side effects or issues with all of those. -Would not make any changes to her blood pressure regimen for now  4.  Stage IIIb CKD -Recent creatinine 1.070 (05/07/2023) -Continue to avoid nephrotoxic medications       Dispo: She can follow-up in 6 months to see Dr. Lynnette Caffey  Signed, Sharlene Dory, PA-C

## 2023-05-14 ENCOUNTER — Encounter: Payer: Self-pay | Admitting: Hematology and Oncology

## 2023-05-14 ENCOUNTER — Encounter: Payer: Self-pay | Admitting: Physician Assistant

## 2023-05-14 ENCOUNTER — Ambulatory Visit: Payer: Medicare Other | Attending: Physician Assistant | Admitting: Physician Assistant

## 2023-05-14 VITALS — BP 132/62 | HR 62 | Ht 68.0 in | Wt 206.2 lb

## 2023-05-14 DIAGNOSIS — I700 Atherosclerosis of aorta: Secondary | ICD-10-CM | POA: Diagnosis not present

## 2023-05-14 DIAGNOSIS — E785 Hyperlipidemia, unspecified: Secondary | ICD-10-CM

## 2023-05-14 DIAGNOSIS — I10 Essential (primary) hypertension: Secondary | ICD-10-CM | POA: Diagnosis not present

## 2023-05-14 DIAGNOSIS — R5383 Other fatigue: Secondary | ICD-10-CM

## 2023-05-14 DIAGNOSIS — I2510 Atherosclerotic heart disease of native coronary artery without angina pectoris: Secondary | ICD-10-CM | POA: Diagnosis not present

## 2023-05-14 DIAGNOSIS — N1832 Chronic kidney disease, stage 3b: Secondary | ICD-10-CM

## 2023-05-14 NOTE — Patient Instructions (Signed)
Medication Instructions:   Your physician recommends that you continue on your current medications as directed. Please refer to the Current Medication list given to you today.  *If you need a refill on your cardiac medications before your next appointment, please call your pharmacy*   Lab Work:  NONE ORDERED  TODAY   If you have labs (blood work) drawn today and your tests are completely normal, you will receive your results only by: MyChart Message (if you have MyChart) OR A paper copy in the mail If you have any lab test that is abnormal or we need to change your treatment, we will call you to review the results.   Testing/Procedures: NONE ORDERED  TODAY     Follow-Up: At [facility], you and your health needs are our priority.  As part of our continuing mission to provide you with exceptional heart care, we have created designated Provider Care Teams.  These Care Teams include your primary Cardiologist (physician) and Advanced Practice Providers (APPs -  Physician Assistants and Nurse Practitioners) who all work together to provide you with the care you need, when you need it.  We recommend signing up for the patient portal called "MyChart".  Sign up information is provided on this After Visit Summary.  MyChart is used to connect with patients for Virtual Visits (Telemedicine).  Patients are able to view lab/test results, encounter notes, upcoming appointments, etc.  Non-urgent messages can be sent to your provider as well.   To learn more about what you can do with MyChart, go to [email or web address].    Your next appointment:   6 month(s)  Provider:    DR Roby Lofts   Other Instructions  Low-Sodium Eating Plan Salt (sodium) helps you keep a healthy balance of fluids in your body. Too much sodium can raise your blood pressure. It can also cause fluid and waste to be held in your body. Your health care provider or dietitian may recommend a low-sodium eating plan if  you have high blood pressure (hypertension), kidney disease, liver disease, or heart failure. Eating less sodium can help lower your blood pressure and reduce swelling. It can also protect your heart, liver, and kidneys. What are tips for following this plan? Reading food labels  Check food labels for the amount of sodium per serving. If you eat more than one serving, you must multiply the listed amount by the number of servings. Choose foods with less than 140 milligrams (mg) of sodium per serving. Avoid foods with 300 mg of sodium or more per serving. Always check how much sodium is in a product, even if the label says "unsalted" or "no salt added." Shopping  Buy products labeled as "low-sodium" or "no salt added." Buy fresh foods. Avoid canned foods and pre-made or frozen meals. Avoid canned, cured, or processed meats. Buy breads that have less than 80 mg of sodium per slice. Cooking  Eat more home-cooked food. Try to eat less restaurant, buffet, and fast food. Try not to add salt when you cook. Use salt-free seasonings or herbs instead of table salt or sea salt. Check with your provider or pharmacist before using salt substitutes. Cook with plant-based oils, such as canola, sunflower, or olive oil. Meal planning When eating at a restaurant, ask if your food can be made with less salt or no salt. Avoid dishes labeled as brined, pickled, cured, or smoked. Avoid dishes made with soy sauce, miso, or teriyaki sauce. Avoid foods that have monosodium glutamate (MSG) in  them. MSG may be added to some restaurant food, sauces, soups, bouillon, and canned foods. Make meals that can be grilled, baked, poached, roasted, or steamed. These are often made with less sodium. General information Try to limit your sodium intake to 1,500-2,300 mg each day, or the amount told by your provider. What foods should I eat? Fruits Fresh, frozen, or canned fruit. Fruit juice. Vegetables Fresh or frozen  vegetables. "No salt added" canned vegetables. "No salt added" tomato sauce and paste. Low-sodium or reduced-sodium tomato and vegetable juice. Grains Low-sodium cereals, such as oats, puffed wheat and rice, and shredded wheat. Low-sodium crackers. Unsalted rice. Unsalted pasta. Low-sodium bread. Whole grain breads and whole grain pasta. Meats and other proteins Fresh or frozen meat, poultry, seafood, and fish. These should have no added salt. Low-sodium canned tuna and salmon. Unsalted nuts. Dried peas, beans, and lentils without added salt. Unsalted canned beans. Eggs. Unsalted nut butters. Dairy Milk. Soy milk. Cheese that is naturally low in sodium, such as ricotta cheese, fresh mozzarella, or Swiss cheese. Low-sodium or reduced-sodium cheese. Cream cheese. Yogurt. Seasonings and condiments Fresh and dried herbs and spices. Salt-free seasonings. Low-sodium mustard and ketchup. Sodium-free salad dressing. Sodium-free light mayonnaise. Fresh or refrigerated horseradish. Lemon juice. Vinegar. Other foods Homemade, reduced-sodium, or low-sodium soups. Unsalted popcorn and pretzels. Low-salt or salt-free chips. The items listed above may not be all the foods and drinks you can have. Talk to a dietitian to learn more. What foods should I avoid? Vegetables Sauerkraut, pickled vegetables, and relishes. Olives. [place] fries. Onion rings. Regular canned vegetables, except low-sodium or reduced-sodium items. Regular canned tomato sauce and paste. Regular tomato and vegetable juice. Frozen vegetables in sauces. Grains Instant hot cereals. Bread stuffing, pancake, and biscuit mixes. Croutons. Seasoned rice or pasta mixes. Noodle soup cups. Boxed or frozen macaroni and cheese. Regular salted crackers. Self-rising flour. Meats and other proteins Meat or fish that is salted, canned, smoked, spiced, or pickled. Precooked or cured meat, such as sausages or meat loaves. [place]. Ham. Pepperoni. Hot dogs. Corned  beef. Chipped beef. Salt pork. Jerky. Pickled herring, anchovies, and sardines. Regular canned tuna. Salted nuts. Dairy Processed cheese and cheese spreads. Hard cheeses. Cheese curds. Blue cheese. Feta cheese. String cheese. Regular cottage cheese. Buttermilk. Canned milk. Fats and oils Salted butter. Regular margarine. Ghee. Bacon fat. Seasonings and condiments Onion salt, garlic salt, seasoned salt, table salt, and sea salt. Canned and packaged gravies. Worcestershire sauce. Tartar sauce. Barbecue sauce. Teriyaki sauce. Soy sauce, including reduced-sodium soy sauce. Steak sauce. Fish sauce. Oyster sauce. Cocktail sauce. Horseradish that you find on the shelf. Regular ketchup and mustard. Meat flavorings and tenderizers. Bouillon cubes. Hot sauce. Pre-made or packaged marinades. Pre-made or packaged taco seasonings. Relishes. Regular salad dressings. Salsa. Other foods Salted popcorn and pretzels. Corn chips and puffs. Potato and tortilla chips. Canned or dried soups. Pizza. Frozen entrees and pot pies. The items listed above may not be all the foods and drinks you should avoid. Talk to a dietitian to learn more. This information is not intended to replace advice given to you by your health care provider. Make sure you discuss any questions you have with your health care provider. Document Revised: 10/11/2022 Document Reviewed: 10/11/2022 Elsevier Patient Education  2024 [employer] Inc[employer]  Heart-Healthy Eating Plan Eating a healthy diet is important for the health of your heart. A heart-healthy eating plan includes: Eating less unhealthy fats. Eating more healthy fats. Eating less salt in your food. Salt is also called  sodium. Making other changes in your diet. Talk with your doctor or a diet specialist (dietitian) to create an eating plan that is right for you. What is my plan? Your doctor may recommend an eating plan that includes: Total fat: ______% or less of total calories a  day. Saturated fat: ______% or less of total calories a day. Cholesterol: less than _________mg a day. Sodium: less than _________mg a day. What are tips for following this plan? Cooking Avoid frying your food. Try to bake, boil, grill, or broil it instead. You can also reduce fat by: Removing the skin from poultry. Removing all visible fats from meats. Steaming vegetables in water or broth. Meal planning  At meals, divide your plate into four equal parts: Fill one-half of your plate with vegetables and green salads. Fill one-fourth of your plate with whole grains. Fill one-fourth of your plate with lean protein foods. Eat 2-4 cups of vegetables per day. One cup of vegetables is: 1 cup (91 g) broccoli or cauliflower florets. 2 medium carrots. 1 large bell pepper. 1 large sweet potato. 1 large tomato. 1 medium white potato. 2 cups (150 g) raw leafy greens. Eat 1-2 cups of fruit per day. One cup of fruit is: 1 small apple 1 large banana 1 cup (237 g) mixed fruit, 1 large orange,  cup (82 g) dried fruit, 1 cup (240 mL) 100% fruit juice. Eat more foods that have soluble fiber. These are apples, broccoli, carrots, beans, peas, and barley. Try to get 20-30 g of fiber per day. Eat 4-5 servings of nuts, legumes, and seeds per week: 1 serving of dried beans or legumes equals  cup (90 g) cooked. 1 serving of nuts is  oz (12 almonds, 24 pistachios, or 7 walnut halves). 1 serving of seeds equals  oz (8 g). General information Eat more home-cooked food. Eat less restaurant, buffet, and fast food. Limit or avoid alcohol. Limit foods that are high in starch and sugar. Avoid fried foods. Lose weight if you are overweight. Keep track of how much salt (sodium) you eat. This is important if you have high blood pressure. Ask your doctor to tell you more about this. Try to add vegetarian meals each week. Fats Choose healthy fats. These include olive oil and canola oil, flaxseeds,  walnuts, almonds, and seeds. Eat more omega-3 fats. These include salmon, mackerel, sardines, tuna, flaxseed oil, and ground flaxseeds. Try to eat fish at least 2 times each week. Check food labels. Avoid foods with trans fats or high amounts of saturated fat. Limit saturated fats. These are often found in animal products, such as meats, butter, and cream. These are also found in plant foods, such as palm oil, palm kernel oil, and coconut oil. Avoid foods with partially hydrogenated oils in them. These have trans fats. Examples are stick margarine, some tub margarines, cookies, crackers, and other baked goods. What foods should I eat? Fruits All fresh, canned (in natural juice), or frozen fruits. Vegetables Fresh or frozen vegetables (raw, steamed, roasted, or grilled). Green salads. Grains Most grains. Choose whole wheat and whole grains most of the time. Rice and pasta, including brown rice and pastas made with whole wheat. Meats and other proteins Lean, well-trimmed beef, veal, pork, and lamb. Chicken and [place] without skin. All fish and shellfish. Wild duck, rabbit, pheasant, and venison. Egg whites or low-cholesterol egg substitutes. Dried beans, peas, lentils, and tofu. Seeds and most nuts. Dairy Low-fat or nonfat cheeses, including ricotta and mozzarella. Skim or  1% milk that is liquid, powdered, or evaporated. Buttermilk that is made with low-fat milk. Nonfat or low-fat yogurt. Fats and oils Non-hydrogenated (trans-free) margarines. Vegetable oils, including soybean, sesame, sunflower, olive, peanut, safflower, corn, canola, and cottonseed. Salad dressings or mayonnaise made with a vegetable oil. Beverages Mineral water. Coffee and tea. Diet carbonated beverages. Sweets and desserts Sherbet, gelatin, and fruit ice. Small amounts of dark chocolate. Limit all sweets and desserts. Seasonings and condiments All seasonings and condiments. The items listed above may not be a complete  list of foods and drinks you can eat. Contact a dietitian for more options. What foods should I avoid? Fruits Canned fruit in heavy syrup. Fruit in cream or butter sauce. Fried fruit. Limit coconut. Vegetables Vegetables cooked in cheese, cream, or butter sauce. Fried vegetables. Grains Breads that are made with saturated or trans fats, oils, or whole milk. Croissants. Sweet rolls. Donuts. High-fat crackers, such as cheese crackers. Meats and other proteins Fatty meats, such as hot dogs, ribs, sausage, bacon, rib-eye roast or steak. High-fat deli meats, such as salami and bologna. Caviar. Domestic duck and goose. Organ meats, such as liver. Dairy Cream, sour cream, cream cheese, and creamed cottage cheese. Whole-milk cheeses. Whole or 2% milk that is liquid, evaporated, or condensed. Whole buttermilk. Cream sauce or high-fat cheese sauce. Yogurt that is made from whole milk. Fats and oils Meat fat, or shortening. Cocoa butter, hydrogenated oils, palm oil, coconut oil, palm kernel oil. Solid fats and shortenings, including bacon fat, salt pork, lard, and butter. Nondairy cream substitutes. Salad dressings with cheese or sour cream. Beverages Regular sodas and juice drinks with added sugar. Sweets and desserts Frosting. Pudding. Cookies. Cakes. Pies. Milk chocolate or white chocolate. Buttered syrups. Full-fat ice cream or ice cream drinks. The items listed above may not be a complete list of foods and drinks to avoid. Contact a dietitian for more information. Summary Heart-healthy meal planning includes eating less unhealthy fats, eating more healthy fats, and making other changes in your diet. Eat a balanced diet. This includes fruits and vegetables, low-fat or nonfat dairy, lean protein, nuts and legumes, whole grains, and heart-healthy oils and fats. This information is not intended to replace advice given to you by your health care provider. Make sure you discuss any questions you have with  your health care provider. Document Revised: 10/30/2021 Document Reviewed: 10/30/2021 Elsevier Patient Education  2024 [employer].

## 2023-05-15 ENCOUNTER — Ambulatory Visit: Payer: Medicare Other | Admitting: Podiatry

## 2023-05-15 DIAGNOSIS — M7752 Other enthesopathy of left foot: Secondary | ICD-10-CM

## 2023-05-15 DIAGNOSIS — M109 Gout, unspecified: Secondary | ICD-10-CM | POA: Diagnosis not present

## 2023-05-15 MED ORDER — COLCHICINE 0.6 MG PO TABS
0.6000 mg | ORAL_TABLET | Freq: Two times a day (BID) | ORAL | 1 refills | Status: DC
Start: 2023-05-15 — End: 2023-10-18

## 2023-05-15 MED ORDER — TRIAMCINOLONE ACETONIDE 10 MG/ML IJ SUSP
10.0000 mg | Freq: Once | INTRAMUSCULAR | Status: AC
Start: 2023-05-15 — End: 2023-05-15
  Administered 2023-05-15: 10 mg via INTRA_ARTICULAR

## 2023-05-15 NOTE — Progress Notes (Signed)
Subjective:   Patient ID: Karen Mills, female   DOB: 67 y.o.   MRN: [id number]   HPI Patient presents with acute inflammation in the left ankle and into the lateral side of the foot and last week had the same condition right.  Has had a history of gout has not been treated for several years   ROS      Objective:  Physical Exam  Neurovascular status intact inflammation of the sinus tarsi of the left with fluid buildup around the deep joint along with inflammatory changes on the lateral side of left foot and history of problem right and patient is taking allopurinol with questions     Assessment:  Probability for acute gout attack right and then left foot and does have inflammatory capsulitis sinus tarsi left     Plan:  H&P reviewed gout and what is occurred over the last couple years also revealing in reviewing medicines and foods that she has been eating with patient having questions.  I discussed gout with her continue allopurinol and we will going to start her on colchicine for acute attacks with education and I gave her sheet on foods to avoid.  I then did sterile prep injected the sinus tarsi left 3 mg Kenalog 5 mg Xylocaine tolerated well reappoint to recheck

## 2023-05-15 NOTE — Patient Instructions (Signed)
Gout  Gout is painful swelling of your joints. Gout is a type of arthritis. It is caused by having too much uric acid in your body. Uric acid is a chemical that is made when your body breaks down substances called purines. If your body has too much uric acid, sharp crystals can form and build up in your joints. This causes pain and swelling. Gout attacks can happen quickly and be very painful (acute gout). Over time, the attacks can affect more joints and happen more often (chronic gout). What are the causes? Gout is caused by too much uric acid in your blood. This can happen because: Your kidneys do not remove enough uric acid from your blood. Your body makes too much uric acid. You eat too many foods that are high in purines. These foods include organ meats, some seafood, and beer. Trauma or stress can bring on an attack. What increases the risk? Having a family history of gout. Being female and middle-aged. Being female and having gone through menopause. Having an organ transplant. Taking certain medicines. Having certain conditions, such as: Being very overweight (obese). Lead poisoning. Kidney disease. A skin condition called psoriasis. Other risks include: Losing weight too quickly. Not having enough water in the body (being dehydrated). Drinking alcohol, especially beer. Drinking beverages that are sweetened with a type of sugar called fructose. What are the signs or symptoms? An attack of acute gout often starts at night and usually happens in just one joint. The most common place is the big toe. Other joints that may be affected include joints of the feet, ankle, knee, fingers, wrist, or elbow. Symptoms may include: Very bad pain. Warmth. Swelling. Stiffness. Tenderness. The affected joint may be very painful to touch. Shiny, red, or purple skin. Chills and fever. Chronic gout may cause symptoms more often. More joints may be involved. You may also have white or yellow lumps  (tophi) on your hands or feet or in other areas near your joints. How is this treated? Treatment for an acute attack may include medicines for pain and swelling, such as: NSAIDs, such as ibuprofen. Steroids taken by mouth or injected into a joint. Colchicine. This can be given by mouth or through an IV tube. Treatment to prevent future attacks may include: Taking small doses of NSAIDs or colchicine daily. Using a medicine that reduces uric acid levels in your blood, such as allopurinol. Making changes to your diet. You may need to see a food expert (dietitian) about what to eat and drink to prevent gout. Follow these instructions at home: During a gout attack  If told, put ice on the painful area. To do this: Put ice in a plastic bag. Place a towel between your skin and the bag. Leave the ice on for 20 minutes, 2-3 times a day. Take off the ice if your skin turns bright red. This is very important. If you cannot feel pain, heat, or cold, you have a greater risk of damage to the area. Raise the painful joint above the level of your heart as often as you can. Rest the joint as much as possible. If the joint is in your leg, you may be given crutches. Follow instructions from your doctor about what you cannot eat or drink. Avoiding future gout attacks Eat a low-purine diet. Avoid foods and drinks such as: Liver. Kidney. Anchovies. Asparagus. Herring. Mushrooms. Mussels. Beer. Stay at a healthy weight. If you want to lose weight, talk with your doctor. Do not   lose weight too fast. Start or continue an exercise plan as told by your doctor. Eating and drinking Avoid drinks sweetened by fructose. Drink enough fluids to keep your pee (urine) pale yellow. If you drink alcohol: Limit how much you have to: 0-1 drink a day for women who are not pregnant. 0-2 drinks a day for men. Know how much alcohol is in a drink. In the [place] one drink equals one 12 oz bottle of beer (355 mL), one 5 oz  glass of wine (148 mL), or one 1 oz glass of hard liquor (44 mL). General instructions Take over-the-counter and prescription medicines only as told by your doctor. Ask your doctor if you should avoid driving or using machines while you are taking your medicine. Return to your normal activities when your doctor says that it is safe. Keep all follow-up visits. Where to find more information National Institutes of Health: www.niams.nih.gov Contact a doctor if: You have another gout attack. You still have symptoms of a gout attack after 10 days of treatment. You have problems (side effects) because of your medicines. You have chills or a fever. You have burning pain when you pee (urinate). You have pain in your lower back or belly. Get help right away if: You have very bad pain. Your pain cannot be controlled. You cannot pee. Summary Gout is painful swelling of the joints. The most common site of pain is the big toe, but it can affect other joints. Medicines and avoiding some foods can help to prevent and treat gout attacks. This information is not intended to replace advice given to you by your health care provider. Make sure you discuss any questions you have with your health care provider. Document Revised: 06/28/2021 Document Reviewed: 06/28/2021 Elsevier Patient Education  2024 Elsevier Inc.  

## 2023-05-20 ENCOUNTER — Telehealth: Payer: Self-pay

## 2023-05-20 NOTE — Telephone Encounter (Signed)
Pt called and LVM inquiring about referral to [facility]. Pt states she has not heard from anyone about scheduling appts. Returned call and LVM for pt with phone number to [facility] with instructions to call their office for appt. Fax was sent and confirmed 05/07/23.

## 2023-05-28 ENCOUNTER — Inpatient Hospital Stay: Payer: Medicare Other

## 2023-05-28 ENCOUNTER — Inpatient Hospital Stay: Payer: Medicare Other | Attending: Hematology and Oncology

## 2023-05-28 VITALS — BP 142/64 | HR 66 | Temp 98.1°F | Resp 16

## 2023-05-28 DIAGNOSIS — D649 Anemia, unspecified: Secondary | ICD-10-CM

## 2023-05-28 DIAGNOSIS — N183 Chronic kidney disease, stage 3 unspecified: Secondary | ICD-10-CM | POA: Insufficient documentation

## 2023-05-28 DIAGNOSIS — D631 Anemia in chronic kidney disease: Secondary | ICD-10-CM | POA: Insufficient documentation

## 2023-05-28 DIAGNOSIS — N182 Chronic kidney disease, stage 2 (mild): Secondary | ICD-10-CM

## 2023-05-28 LAB — CMP (CANCER CENTER ONLY)
ALT: 10 U/L (ref 0–44)
AST: 16 U/L (ref 15–41)
Albumin: 3.6 g/dL (ref 3.5–5.0)
Alkaline Phosphatase: 69 U/L (ref 38–126)
Anion gap: 7 (ref 5–15)
BUN: 23 mg/dL (ref 8–23)
CO2: 21 mmol/L — ABNORMAL LOW (ref 22–32)
Calcium: 9.2 mg/dL (ref 8.9–10.3)
Chloride: 107 mmol/L (ref 98–111)
Creatinine: 1.08 mg/dL — ABNORMAL HIGH (ref 0.44–1.00)
GFR, Estimated: 56 mL/min — ABNORMAL LOW (ref >60)
Glucose, Bld: 102 mg/dL — ABNORMAL HIGH (ref 70–99)
Potassium: 4 mmol/L (ref 3.5–5.1)
Sodium: 135 mmol/L (ref 135–145)
Total Bilirubin: 0.6 mg/dL (ref 0.3–1.2)
Total Protein: 6.9 g/dL (ref 6.5–8.1)

## 2023-05-28 LAB — CBC WITH DIFFERENTIAL (CANCER CENTER ONLY)
Abs Immature Granulocytes: 0.01 10*3/uL (ref 0.00–0.07)
Basophils Absolute: 0.1 10*3/uL (ref 0.0–0.1)
Basophils Relative: 1 %
Eosinophils Absolute: 0.1 10*3/uL (ref 0.0–0.5)
Eosinophils Relative: 2 %
HCT: 31.2 % — ABNORMAL LOW (ref 36.0–46.0)
Hemoglobin: 10.3 g/dL — ABNORMAL LOW (ref 12.0–15.0)
Immature Granulocytes: 0 %
Lymphocytes Relative: 20 %
Lymphs Abs: 0.9 10*3/uL (ref 0.7–4.0)
MCH: 31.7 pg (ref 26.0–34.0)
MCHC: 33 g/dL (ref 30.0–36.0)
MCV: 96 fL (ref 80.0–100.0)
Monocytes Absolute: 0.3 10*3/uL (ref 0.1–1.0)
Monocytes Relative: 7 %
Neutro Abs: 3.2 10*3/uL (ref 1.7–7.7)
Neutrophils Relative %: 70 %
Platelet Count: 271 10*3/uL (ref 150–400)
RBC: 3.25 MIL/uL — ABNORMAL LOW (ref 3.87–5.11)
RDW: 15.1 % (ref 11.5–15.5)
WBC Count: 4.6 10*3/uL (ref 4.0–10.5)
nRBC: 0 % (ref 0.0–0.2)

## 2023-05-28 MED ORDER — EPOETIN ALFA-EPBX 40000 UNIT/ML IJ SOLN
40000.0000 [IU] | Freq: Once | INTRAMUSCULAR | Status: AC
  Administered 2023-05-28: 40000 [IU] via SUBCUTANEOUS
  Filled 2023-05-28: qty 1

## 2023-06-10 IMAGING — CT CT CARDIAC CORONARY ARTERY CALCIUM SCORE
3 series · 14 of 20 positions shown, 16 images · non-contrast
Comparison: None.
COMPARISON: None.

Addendum:
EXAM:
OVER-READ INTERPRETATION  CT CHEST

The following report is an over-read performed by radiologist Dr.
Ferienhaus Erxleben [REDACTED] on 10/26/2021. This
over-read does not include interpretation of cardiac or coronary
anatomy or pathology. The coronary calcium score interpretation by
the cardiologist is attached.
CLINICAL DATA: Cardiovascular Disease Risk stratification
Coronary Calcium Score
TECHNIQUE: A gated, non-contrast computed tomography scan of the heart was
performed using 3mm slice thickness. Axial images were analyzed on a
dedicated workstation. Calcium scoring of the coronary arteries was
performed using the Agatston method.

[Series 2: cascseq 2.0 sa36 70% (id) · axial · 0.39mm/px · z∈[-203,-113]mm · 4 of 76 slices shown]
[im 16/76  vessel]
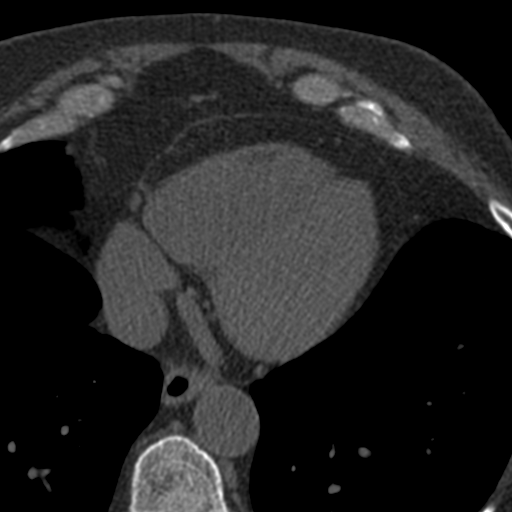
[im 31/76  vessel]
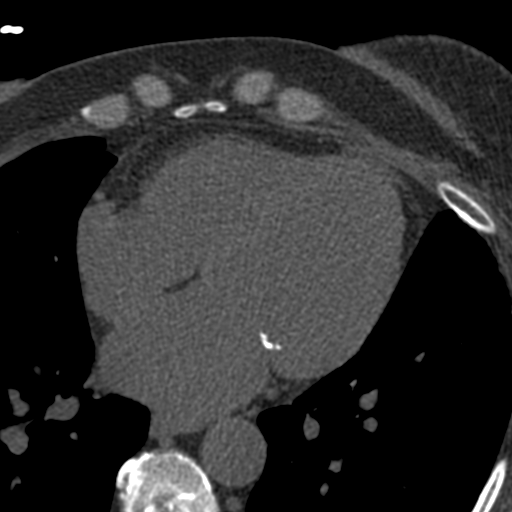
[im 46/76  vessel]
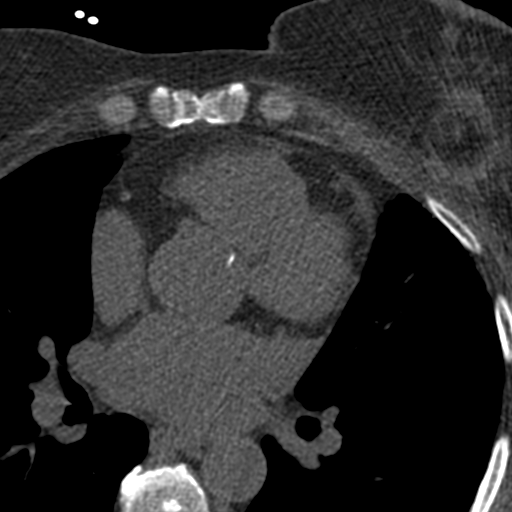
[im 61/76  vessel]
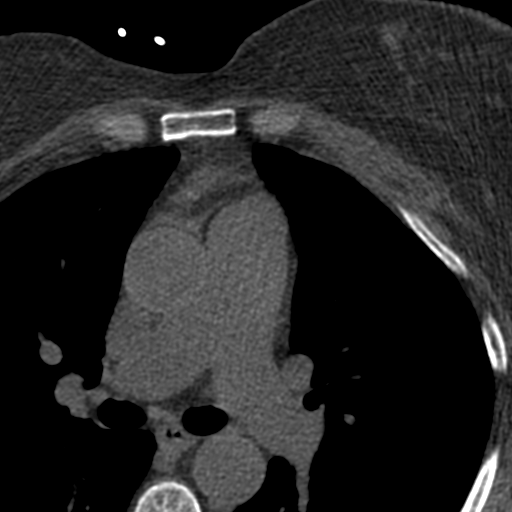

[Series 3: cascseq 2.0 bf37 st · axial · 0.73mm/px · z∈[-209,-109]mm · 5 of 76 slices shown, 7 images]
[im 13/76  vessel]
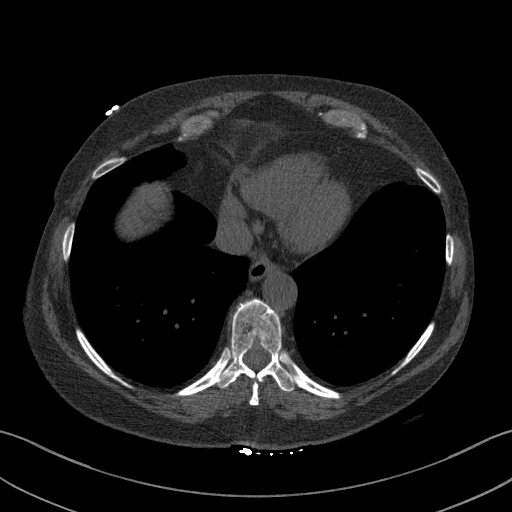
[im 13/76  lung]
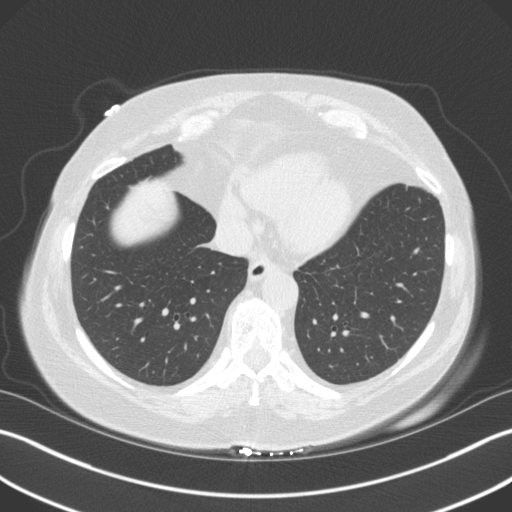
[im 26/76  vessel]
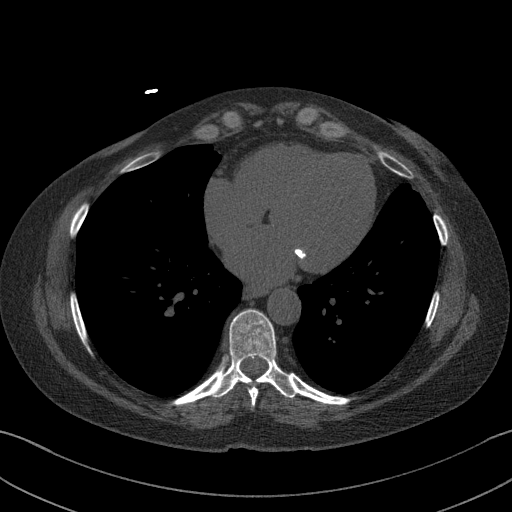
[im 38/76  vessel]
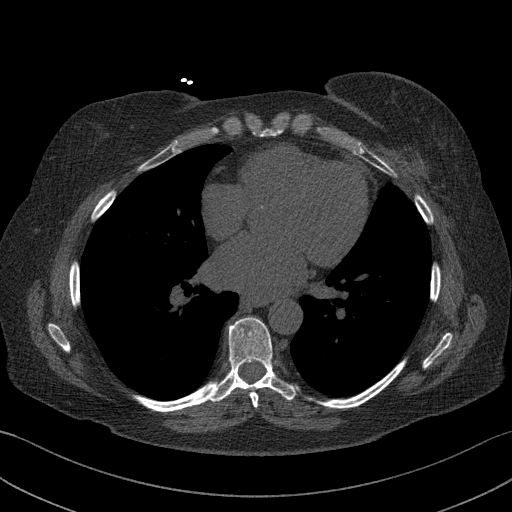
[im 51/76  vessel]
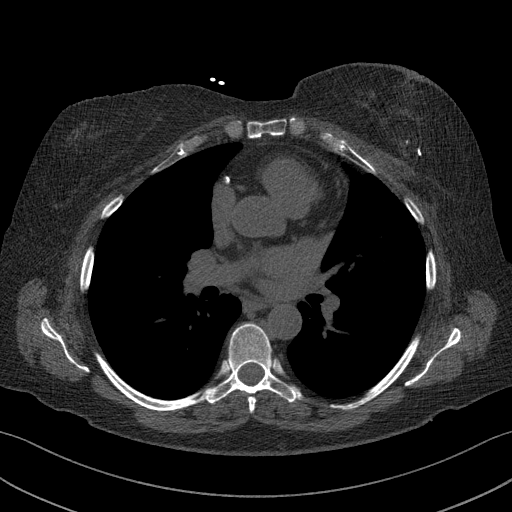
[im 63/76  vessel]
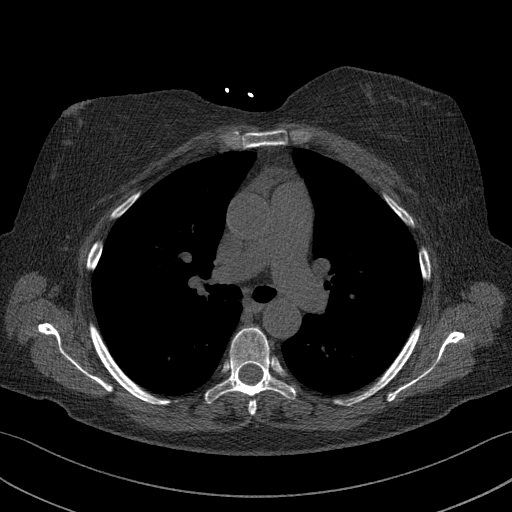
[im 63/76  lung]
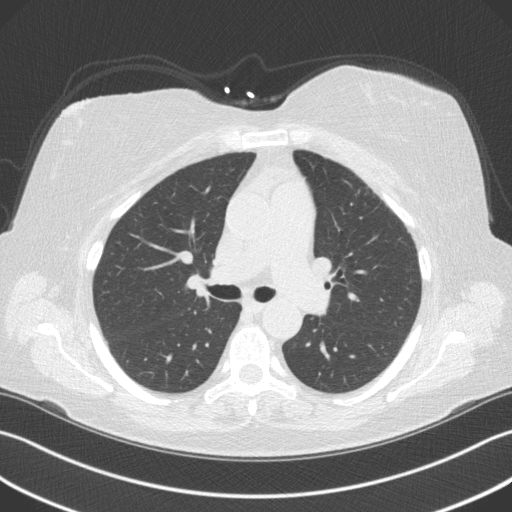

[Series 4: cascseq 2.0 br59 lung · axial · 0.73mm/px · z∈[-209,-109]mm · 5 of 76 slices shown]
[im 13/76  lung]
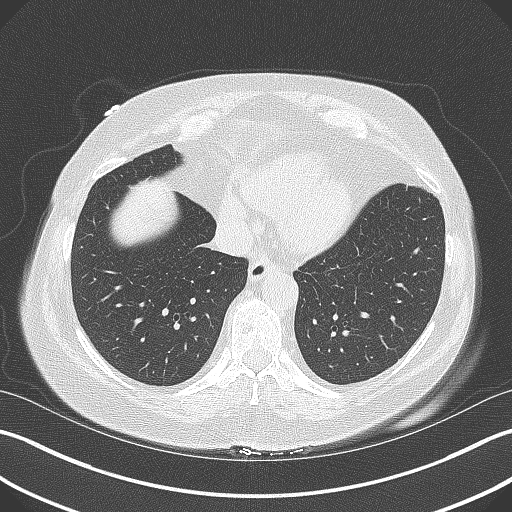
[im 26/76  lung]
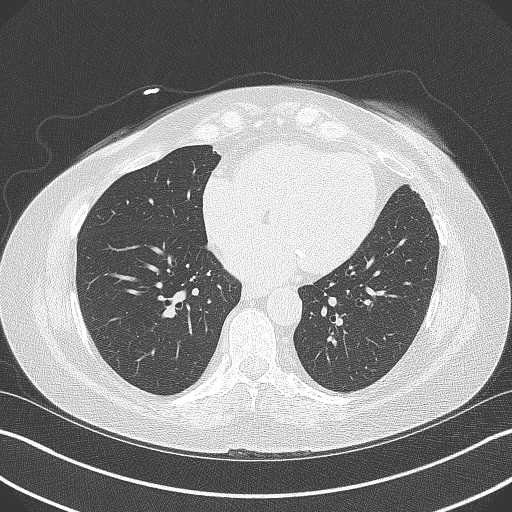
[im 38/76  lung]
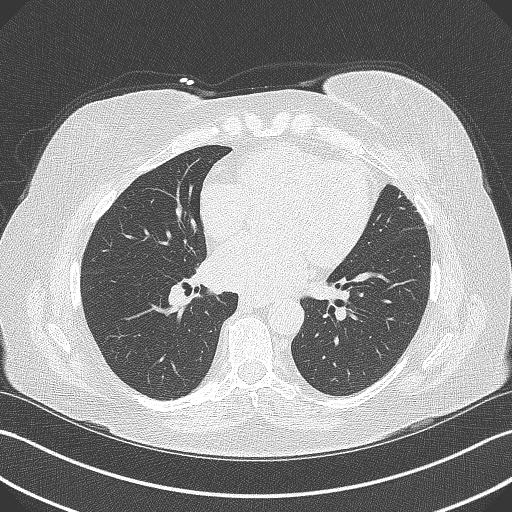
[im 51/76  lung]
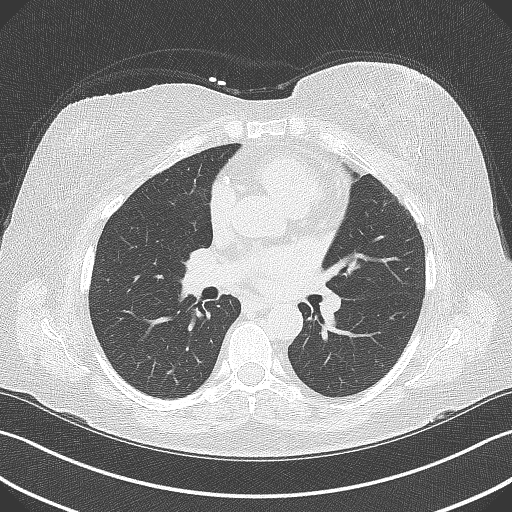
[im 63/76  lung]
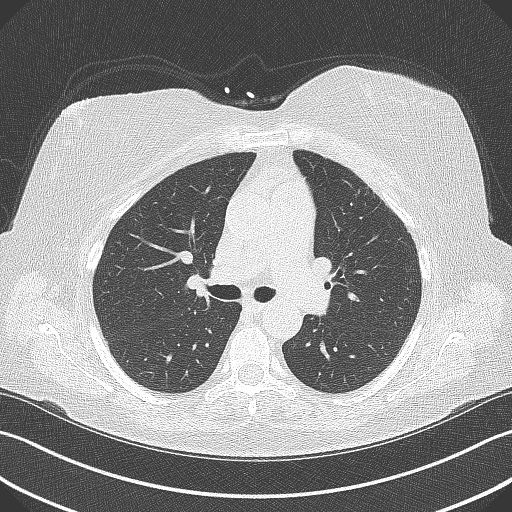

[14 of 20 positions shown; findings below may reference images not displayed]

FINDINGS: Vascular: No significant noncardiac vascular findings.

Mediastinum/Nodes: Visualized mediastinum and hilar regions
demonstrate no lymphadenopathy or masses.

Lungs/Pleura: Visualized lungs show no evidence of pulmonary edema,
consolidation, pneumothorax, nodule or pleural fluid.

Upper Abdomen: No acute abnormality.

Musculoskeletal: No chest wall mass or suspicious bone lesions
identified.

Evidence of prior left breast surgery.
IMPRESSION: No significant incidental findings.
FINDINGS: Coronary arteries: Normal origins.

Coronary Calcium Score:

Left main:

Left anterior descending artery:

Left circumflex artery: 0

Right coronary artery: 201

Total: 342

Percentile: 92nd

Pericardium: Normal.

Aorta: Normal caliber of ascending aorta. Aortic atherosclerosis
noted.

Mitral annular calcification.

Non-cardiac: See separate report from [REDACTED].
IMPRESSION: Coronary calcium score of 342. This was 92nd percentile for age-,
race-, and sex-matched controls. Aortic atherosclerosis.



If CAC=0, it is reasonable to withhold statin therapy and reassess
in 5 to 10 years, as long as higher risk conditions are absent
(diabetes mellitus, family history of premature CHD in first degree
relatives (males <55 years; females <65 years), cigarette smoking,
or LDL >=190 mg/dL).

If CAC is 1 to 99, it is reasonable to initiate statin therapy for
patients >=55 years of age.

If CAC is >=100 or >=75th percentile, it is reasonable to initiate
statin therapy at any age.

Cardiology referral should be considered for patients with CAC
scores >=400 or >=75th percentile.

*8789 AHA/ACC/AACVPR/AAPA/ABC/TELBERT/LEBANACEVA/ARCADIO/Fdi/GAVEZ/BELKHIR/AMAZIGH
Guideline on the Management of Blood Cholesterol: A Report of the
American College of Cardiology/American Heart Association Task Force
on Clinical Practice Guidelines. J Am Coll Cardiol.
7538;73(24):0856-0939.

*** End of Addendum ***
EXAM:
OVER-READ INTERPRETATION  CT CHEST

The following report is an over-read performed by radiologist Dr.
Ferienhaus Erxleben [REDACTED] on 10/26/2021. This
over-read does not include interpretation of cardiac or coronary
anatomy or pathology. The coronary calcium score interpretation by
the cardiologist is attached.
FINDINGS: Vascular: No significant noncardiac vascular findings.

Mediastinum/Nodes: Visualized mediastinum and hilar regions
demonstrate no lymphadenopathy or masses.

Lungs/Pleura: Visualized lungs show no evidence of pulmonary edema,
consolidation, pneumothorax, nodule or pleural fluid.

Upper Abdomen: No acute abnormality.

Musculoskeletal: No chest wall mass or suspicious bone lesions
identified.

Evidence of prior left breast surgery.
IMPRESSION: No significant incidental findings.

## 2023-06-12 ENCOUNTER — Other Ambulatory Visit: Payer: Self-pay | Admitting: *Deleted

## 2023-06-12 ENCOUNTER — Other Ambulatory Visit: Payer: Self-pay

## 2023-06-12 DIAGNOSIS — N182 Chronic kidney disease, stage 2 (mild): Secondary | ICD-10-CM

## 2023-06-12 DIAGNOSIS — C50512 Malignant neoplasm of lower-outer quadrant of left female breast: Secondary | ICD-10-CM

## 2023-06-12 DIAGNOSIS — R768 Other specified abnormal immunological findings in serum: Secondary | ICD-10-CM | POA: Diagnosis not present

## 2023-06-12 DIAGNOSIS — N1831 Chronic kidney disease, stage 3a: Secondary | ICD-10-CM | POA: Diagnosis not present

## 2023-06-12 DIAGNOSIS — E871 Hypo-osmolality and hyponatremia: Secondary | ICD-10-CM | POA: Diagnosis not present

## 2023-06-12 DIAGNOSIS — I129 Hypertensive chronic kidney disease with stage 1 through stage 4 chronic kidney disease, or unspecified chronic kidney disease: Secondary | ICD-10-CM | POA: Diagnosis not present

## 2023-06-14 DIAGNOSIS — M170 Bilateral primary osteoarthritis of knee: Secondary | ICD-10-CM | POA: Diagnosis not present

## 2023-06-14 DIAGNOSIS — M7061 Trochanteric bursitis, right hip: Secondary | ICD-10-CM | POA: Diagnosis not present

## 2023-06-14 DIAGNOSIS — M7062 Trochanteric bursitis, left hip: Secondary | ICD-10-CM | POA: Diagnosis not present

## 2023-06-17 NOTE — Progress Notes (Signed)
Patient Care Team: Daisy Floro, MD as PCP - General (Family Medicine) Serena Croissant, MD as Consulting Physician (Hematology and Oncology) Dorothy Puffer, MD as Consulting Physician (Radiation Oncology) Griselda Miner, MD as Consulting Physician (General Surgery)  DIAGNOSIS: No diagnosis found.  SUMMARY OF ONCOLOGIC HISTORY: Oncology History  Malignant neoplasm of lower-outer quadrant of left breast of female, estrogen receptor positive (HCC)  04/03/2019 Cancer Staging   Staging form: Breast, AJCC 8th Edition - Clinical stage from 04/03/2019: Stage IA (cT1a, cN0, cM0, G2, ER+, PR+, HER2-) - Signed by Loa Socks, NP on 04/15/2019   04/15/2019 Initial Diagnosis   Screening mammogram detected 0.4cm mass in the left breast at the 6 o'clock position with no left axillary adenopathy. Biopsy on 04/03/19 showed IDC, grade 1-2, HER-2 - (0), ER +95%, PR+ 95%, Ki67 15%.    04/30/2019 Surgery   Left lumpectomy Carolynne Edouard): IDC, grade 1, 0.8cm, intermediate grade DCIS, lymphovascular invasion present, clear margins. Three left axillary lymph nodes negative for carcinoma.    06/02/2019 -  Radiation Therapy   Adjuvant radiation   08/2019 -  Anti-estrogen oral therapy   Anastrozole daily stopped 10/2019 due to severe joint pain; switched to tamoxifen 10mg  on 01/11/20   10/06/2021 Genetic Testing   Negative hereditary cancer genetic testing: no pathogenic variants detected in [facility] CancerNext-Expanded +RNAinsight Panel.  Variants of uncertain significance detected in TSC1 at  p.L439V (c.1315C>G) and in TSC2 at p.D624N (c.1870G>A).  The report date is 10/06/2021.  The CancerNext-Expanded gene panel offered by [facility] and includes sequencing, rearrangement, and RNA analysis for the following 77 genes: AIP, ALK, APC, ATM, AXIN2, BAP1, BARD1, BLM, BMPR1A, BRCA1, BRCA2, BRIP1, CDC73, CDH1, CDK4, CDKN1B, CDKN2A, CHEK2, CTNNA1, DICER1, FANCC, FH, FLCN, GALNT12, KIF1B, LZTR1, MAX, MEN1, MET, MLH1,  MSH2, MSH3, MSH6, MUTYH, NBN, NF1, NF2, NTHL1, PALB2, PHOX2B, PMS2, POT1, PRKAR1A, PTCH1, PTEN, RAD51C, RAD51D, RB1, RECQL, RET, SDHA, SDHAF2, SDHB, SDHC, SDHD, SMAD4, SMARCA4, SMARCB1, SMARCE1, STK11, SUFU, TMEM127, TP53, TSC1, TSC2, VHL and XRCC2 (sequencing and deletion/duplication); EGFR, EGLN1, HOXB13, KIT, MITF, PDGFRA, POLD1, and POLE (sequencing only); EPCAM and GREM1 (deletion/duplication only).  UPDATE: TSC1 p.L439V VUS has been amended to Benign. Amended report date is 08/13/2022.     CHIEF COMPLIANT: Follow-up Anemia   INTERVAL HISTORY: Karen Mills is a 67 y.o. with above-mentioned history  normochromic anemia who underwent on antiestrogen therapy with tamoxifen. She presents to the clinic for a follow-up.    ALLERGIES:  is allergic to accupril [quinapril hcl], amlodipine besylate, atacand [candesartan], atorvastatin, cozaar [losartan potassium], hydrochlorothiazide w-triamterene, tiazac [diltiazem hcl er beads], and zestril [lisinopril].  MEDICATIONS:  Current Outpatient Medications  Medication Sig Dispense Refill   allopurinol (ZYLOPRIM) 300 MG tablet Take 300 mg by mouth every morning.     B Complex-C (B-COMPLEX WITH VITAMIN C) tablet Take 1 tablet by mouth daily.     Biotin 25427 MCG TABS Take 10,000 mcg by mouth every morning.     carvedilol (COREG) 6.25 MG tablet Take 6.25 mg by mouth 2 (two) times daily.     cholecalciferol (VITAMIN D3) 25 MCG (1000 UT) tablet Take 1,000 Units by mouth every morning.     colchicine 0.6 MG tablet Take 1 tablet (0.6 mg total) by mouth 2 (two) times daily. 30 tablet 1   hydrALAZINE (APRESOLINE) 100 MG tablet Take 100 mg by mouth daily.     Krill Oil 1000 MG CAPS 1 capsule Orally once a day     levothyroxine (SYNTHROID)  100 MCG tablet Take 100 mcg by mouth daily before breakfast.     Multiple Vitamins-Minerals (HAIR FORMULA EXTRA STRENGTH PO) 4 tablets Orally once a day     Omega-3 Fatty Acids (FISH OIL PO) Take 800 mg by mouth every  morning.     rosuvastatin (CRESTOR) 20 MG tablet Take 1 tablet (20 mg total) by mouth daily. 90 tablet 3   tamoxifen (NOLVADEX) 10 MG tablet TAKE ONE TABLET BY MOUTH DAILY 90 tablet 3   Vitamin E 670 MG (1000 UT) CAPS Take 1,000 Units by mouth every morning.     No current facility-administered medications for this visit.    PHYSICAL EXAMINATION: ECOG PERFORMANCE STATUS: {CHL ONC ECOG PS:[phone]}  There were no vitals filed for this visit. There were no vitals filed for this visit.  BREAST:*** No palpable masses or nodules in either right or left breasts. No palpable axillary supraclavicular or infraclavicular adenopathy no breast tenderness or nipple discharge. (exam performed in the presence of a chaperone)  LABORATORY DATA:  I have reviewed the data as listed    Latest Ref Rng & Units 05/28/2023    8:36 AM 05/07/2023    9:21 AM 04/16/2023    8:33 AM  CMP  Glucose 70 - 99 mg/dL 540  88  84   BUN 8 - 23 mg/dL 23  23  27    Creatinine 0.44 - 1.00 mg/dL 9.81  1.91  4.78   Sodium 135 - 145 mmol/L 135  129  130   Potassium 3.5 - 5.1 mmol/L 4.0  3.9  4.2   Chloride 98 - 111 mmol/L 107  100  100   CO2 22 - 32 mmol/L 21  21  22    Calcium 8.9 - 10.3 mg/dL 9.2  9.4  9.5   Total Protein 6.5 - 8.1 g/dL 6.9  6.9  6.9   Total Bilirubin 0.3 - 1.2 mg/dL 0.6  0.6  0.5   Alkaline Phos 38 - 126 U/L 69  75  83   AST 15 - 41 U/L 16  17  17    ALT 0 - 44 U/L 10  10  9      Lab Results  Component Value Date   WBC 4.6 05/28/2023   HGB 10.3 (L) 05/28/2023   HCT 31.2 (L) 05/28/2023   MCV 96.0 05/28/2023   PLT 271 05/28/2023   NEUTROABS 3.2 05/28/2023    ASSESSMENT & PLAN:  No problem-specific Assessment & Plan notes found for this encounter.    No orders of the defined types were placed in this encounter.  The patient has a good understanding of the overall plan. she agrees with it. she will call with any problems that may develop before the next visit here. Total time spent: 30 mins  including face to face time and time spent for planning, charting and co-ordination of care   Sherlyn Lick, CMA 06/17/23    I Janan Ridge am acting as a Neurosurgeon for [employer]  ***

## 2023-06-19 ENCOUNTER — Inpatient Hospital Stay: Payer: Medicare Other | Attending: Hematology and Oncology

## 2023-06-19 ENCOUNTER — Telehealth: Payer: Self-pay | Admitting: *Deleted

## 2023-06-19 ENCOUNTER — Inpatient Hospital Stay: Payer: Medicare Other | Admitting: Hematology and Oncology

## 2023-06-19 ENCOUNTER — Inpatient Hospital Stay: Payer: Medicare Other

## 2023-06-19 ENCOUNTER — Other Ambulatory Visit: Payer: Self-pay | Admitting: Nephrology

## 2023-06-19 VITALS — BP 190/80

## 2023-06-19 VITALS — BP 168/70 | HR 68 | Temp 97.8°F | Resp 18 | Ht 68.0 in | Wt 205.8 lb

## 2023-06-19 DIAGNOSIS — D649 Anemia, unspecified: Secondary | ICD-10-CM | POA: Diagnosis not present

## 2023-06-19 DIAGNOSIS — N1831 Chronic kidney disease, stage 3a: Secondary | ICD-10-CM

## 2023-06-19 DIAGNOSIS — D631 Anemia in chronic kidney disease: Secondary | ICD-10-CM | POA: Insufficient documentation

## 2023-06-19 DIAGNOSIS — Z170 Estrogen receptor positive status [ER+]: Secondary | ICD-10-CM

## 2023-06-19 DIAGNOSIS — C50512 Malignant neoplasm of lower-outer quadrant of left female breast: Secondary | ICD-10-CM

## 2023-06-19 DIAGNOSIS — N183 Chronic kidney disease, stage 3 unspecified: Secondary | ICD-10-CM | POA: Diagnosis not present

## 2023-06-19 DIAGNOSIS — N182 Chronic kidney disease, stage 2 (mild): Secondary | ICD-10-CM

## 2023-06-19 LAB — CMP (CANCER CENTER ONLY)
ALT: 9 U/L (ref 0–44)
AST: 15 U/L (ref 15–41)
Albumin: 3.7 g/dL (ref 3.5–5.0)
Alkaline Phosphatase: 66 U/L (ref 38–126)
Anion gap: 7 (ref 5–15)
BUN: 23 mg/dL (ref 8–23)
CO2: 23 mmol/L (ref 22–32)
Calcium: 9.2 mg/dL (ref 8.9–10.3)
Chloride: 103 mmol/L (ref 98–111)
Creatinine: 0.98 mg/dL (ref 0.44–1.00)
GFR, Estimated: >60 mL/min (ref >60)
Glucose, Bld: 109 mg/dL — ABNORMAL HIGH (ref 70–99)
Potassium: 3.8 mmol/L (ref 3.5–5.1)
Sodium: 133 mmol/L — ABNORMAL LOW (ref 135–145)
Total Bilirubin: 0.6 mg/dL (ref 0.3–1.2)
Total Protein: 6.9 g/dL (ref 6.5–8.1)

## 2023-06-19 LAB — CBC WITH DIFFERENTIAL (CANCER CENTER ONLY)
Abs Immature Granulocytes: 0.03 10*3/uL (ref 0.00–0.07)
Basophils Absolute: 0.1 10*3/uL (ref 0.0–0.1)
Basophils Relative: 1 %
Eosinophils Absolute: 0.1 10*3/uL (ref 0.0–0.5)
Eosinophils Relative: 3 %
HCT: 31.7 % — ABNORMAL LOW (ref 36.0–46.0)
Hemoglobin: 10.4 g/dL — ABNORMAL LOW (ref 12.0–15.0)
Immature Granulocytes: 1 %
Lymphocytes Relative: 26 %
Lymphs Abs: 1.1 10*3/uL (ref 0.7–4.0)
MCH: 31.6 pg (ref 26.0–34.0)
MCHC: 32.8 g/dL (ref 30.0–36.0)
MCV: 96.4 fL (ref 80.0–100.0)
Monocytes Absolute: 0.3 10*3/uL (ref 0.1–1.0)
Monocytes Relative: 8 %
Neutro Abs: 2.5 10*3/uL (ref 1.7–7.7)
Neutrophils Relative %: 61 %
Platelet Count: 296 10*3/uL (ref 150–400)
RBC: 3.29 MIL/uL — ABNORMAL LOW (ref 3.87–5.11)
RDW: 15 % (ref 11.5–15.5)
WBC Count: 4.1 10*3/uL (ref 4.0–10.5)
nRBC: 0 % (ref 0.0–0.2)

## 2023-06-19 LAB — PHOSPHORUS: Phosphorus: 3.5 mg/dL (ref 2.5–4.6)

## 2023-06-19 MED ORDER — EPOETIN ALFA-EPBX 40000 UNIT/ML IJ SOLN
40000.0000 [IU] | Freq: Once | INTRAMUSCULAR | Status: AC
  Administered 2023-06-19: 40000 [IU] via SUBCUTANEOUS
  Filled 2023-06-19: qty 1

## 2023-06-19 NOTE — Telephone Encounter (Signed)
Pt came in to this office for Retacrit injection for support of known anemia.  BP readings elevated at 190/76 - rechecked 193/74 and manually at 190/80.  Retacrit given- with request for pt to be in contact with her primary MD for evaluation.  This note will be forwarded to Dr Tenny Craw for review.

## 2023-06-19 NOTE — Progress Notes (Signed)
Pt. Here for injection Retracrit.  Blood pressure elevated during doctor's visit.  Recheck blood pressure x 2 automatically and one x manually.  Blood pressure continues to be elevated.  Secure chatted Dr. Pamelia Hoit and Valerie/RN.  States ok to proceed with injection, and pt. Needs to follow up with PCP.  Pt. Was informed.

## 2023-06-19 NOTE — Assessment & Plan Note (Signed)
Lab review: 12/12/2022: Ferritin 193, iron saturation 13%, hemoglobin 9.5, creatinine 1.42 01/15/2023: Hemoglobin 9.4, MCV 98.3, platelets 317 02/12/2023: Hemoglobin 9.7 03/27/2023: Hemoglobin 10 05/07/2023: Hemoglobin 10.5 (proceed with today's injection) 05/28/2023: Hemoglobin 10.3   anemia of chronic kidney disease stage II-III. Current treatment: Retacrit 40,000 units every 3 weeks started 01/22/2023 Goal hemoglobin: 10.5 g   Follow-up every 3 weeks for lab and injections and then in 9 weeks for follow-up with me Right earlobe redness and cellulitis: Sent prescription for amoxicillin.

## 2023-06-19 NOTE — Telephone Encounter (Signed)
Ok to proceed with Retacrit per informed elevated BP- pt to follow up further with primary MD.

## 2023-06-19 NOTE — Assessment & Plan Note (Signed)
04/15/2019:Screening mammogram detected 0.4cm mass in the left breast at the 6 o'clock position with no left axillary adenopathy. Biopsy on 04/03/19 showed IDC, grade 1-2, HER-2 - (0), ER +95%, PR+ 95%, Ki67 15%.  T1 a N0 stage Ia clinical stage   Treatment plan: 1.  Breast conserving surgery with sentinel lymph node biopsy 04/30/2019: Grade 1 IDC 0.8 cm, 0/3 lymph nodes, ER 95%, PR 95%, KI 6715%, HER-2 negative, T1BN0 stage Ia 2.  Adjuvant radiation therapy 06/02/2019- 3.  Follow-up adjuvant antiestrogen therapy with anastrozole 1 mg daily x5 years -------------------------------------------------------------------------------------------------------------------- Current treatment: Adjuvant antiestrogen therapy with anastrozole 1 mg daily started 07/09/2019 (discontinued due to severe joint aches and pains) switched to tamoxifen 10 mg started 01/11/2020 .   Tamoxifen toxicities: Tolerating it extremely well   Breast cancer surveillance: Mammogram 04/10/2023: Benign, breast density category B Breast exam 01/15/2023: Benign

## 2023-06-20 DIAGNOSIS — K080 Exfoliation of teeth due to systemic causes: Secondary | ICD-10-CM | POA: Diagnosis not present

## 2023-06-21 DIAGNOSIS — M7061 Trochanteric bursitis, right hip: Secondary | ICD-10-CM | POA: Diagnosis not present

## 2023-06-21 DIAGNOSIS — M1712 Unilateral primary osteoarthritis, left knee: Secondary | ICD-10-CM | POA: Diagnosis not present

## 2023-06-27 ENCOUNTER — Ambulatory Visit
Admission: RE | Admit: 2023-06-27 | Discharge: 2023-06-27 | Disposition: A | Discharge status: Home / Self Care | Payer: Medicare Other | Source: Ambulatory Visit | Attending: Nephrology | Admitting: Nephrology

## 2023-06-27 DIAGNOSIS — I10 Essential (primary) hypertension: Secondary | ICD-10-CM | POA: Diagnosis not present

## 2023-06-27 DIAGNOSIS — N1831 Chronic kidney disease, stage 3a: Secondary | ICD-10-CM

## 2023-06-27 DIAGNOSIS — N183 Chronic kidney disease, stage 3 unspecified: Secondary | ICD-10-CM | POA: Diagnosis not present

## 2023-06-28 ENCOUNTER — Encounter: Payer: Self-pay | Admitting: Nephrology

## 2023-07-01 DIAGNOSIS — N1831 Chronic kidney disease, stage 3a: Secondary | ICD-10-CM | POA: Diagnosis not present

## 2023-07-01 DIAGNOSIS — R768 Other specified abnormal immunological findings in serum: Secondary | ICD-10-CM | POA: Diagnosis not present

## 2023-07-16 ENCOUNTER — Inpatient Hospital Stay: Payer: Medicare Other | Attending: Hematology and Oncology

## 2023-07-16 ENCOUNTER — Inpatient Hospital Stay: Payer: Medicare Other

## 2023-07-16 DIAGNOSIS — Z170 Estrogen receptor positive status [ER+]: Secondary | ICD-10-CM | POA: Insufficient documentation

## 2023-07-16 DIAGNOSIS — D649 Anemia, unspecified: Secondary | ICD-10-CM

## 2023-07-16 DIAGNOSIS — C50512 Malignant neoplasm of lower-outer quadrant of left female breast: Secondary | ICD-10-CM | POA: Insufficient documentation

## 2023-07-16 DIAGNOSIS — N182 Chronic kidney disease, stage 2 (mild): Secondary | ICD-10-CM

## 2023-07-16 LAB — CBC WITH DIFFERENTIAL (CANCER CENTER ONLY)
Abs Immature Granulocytes: 0.02 10*3/uL (ref 0.00–0.07)
Basophils Absolute: 0 10*3/uL (ref 0.0–0.1)
Basophils Relative: 1 %
Eosinophils Absolute: 0.1 10*3/uL (ref 0.0–0.5)
Eosinophils Relative: 2 %
HCT: 33.6 % — ABNORMAL LOW (ref 36.0–46.0)
Hemoglobin: 11.2 g/dL — ABNORMAL LOW (ref 12.0–15.0)
Immature Granulocytes: 0 %
Lymphocytes Relative: 24 %
Lymphs Abs: 1.2 10*3/uL (ref 0.7–4.0)
MCH: 33.1 pg (ref 26.0–34.0)
MCHC: 33.3 g/dL (ref 30.0–36.0)
MCV: 99.4 fL (ref 80.0–100.0)
Monocytes Absolute: 0.5 10*3/uL (ref 0.1–1.0)
Monocytes Relative: 9 %
Neutro Abs: 3.4 10*3/uL (ref 1.7–7.7)
Neutrophils Relative %: 64 %
Platelet Count: 256 10*3/uL (ref 150–400)
RBC: 3.38 MIL/uL — ABNORMAL LOW (ref 3.87–5.11)
RDW: 16.2 % — ABNORMAL HIGH (ref 11.5–15.5)
WBC Count: 5.3 10*3/uL (ref 4.0–10.5)
nRBC: 0 % (ref 0.0–0.2)

## 2023-07-16 LAB — CMP (CANCER CENTER ONLY)
ALT: 14 U/L (ref 0–44)
AST: 15 U/L (ref 15–41)
Albumin: 3.5 g/dL (ref 3.5–5.0)
Alkaline Phosphatase: 66 U/L (ref 38–126)
Anion gap: 8 (ref 5–15)
BUN: 26 mg/dL — ABNORMAL HIGH (ref 8–23)
CO2: 21 mmol/L — ABNORMAL LOW (ref 22–32)
Calcium: 9.2 mg/dL (ref 8.9–10.3)
Chloride: 109 mmol/L (ref 98–111)
Creatinine: 1.34 mg/dL — ABNORMAL HIGH (ref 0.44–1.00)
GFR, Estimated: 43 mL/min — ABNORMAL LOW (ref >60)
Glucose, Bld: 61 mg/dL — ABNORMAL LOW (ref 70–99)
Potassium: 3.8 mmol/L (ref 3.5–5.1)
Sodium: 138 mmol/L (ref 135–145)
Total Bilirubin: 0.6 mg/dL (ref 0.3–1.2)
Total Protein: 6.3 g/dL — ABNORMAL LOW (ref 6.5–8.1)

## 2023-07-16 IMAGING — DX DG CHEST 1V PORT
1 series · 1 of 1 positions shown · non-contrast
Comparison: None.

CLINICAL DATA: Respiratory failure

EXAM:
PORTABLE CHEST 1 VIEW

[chest ap]
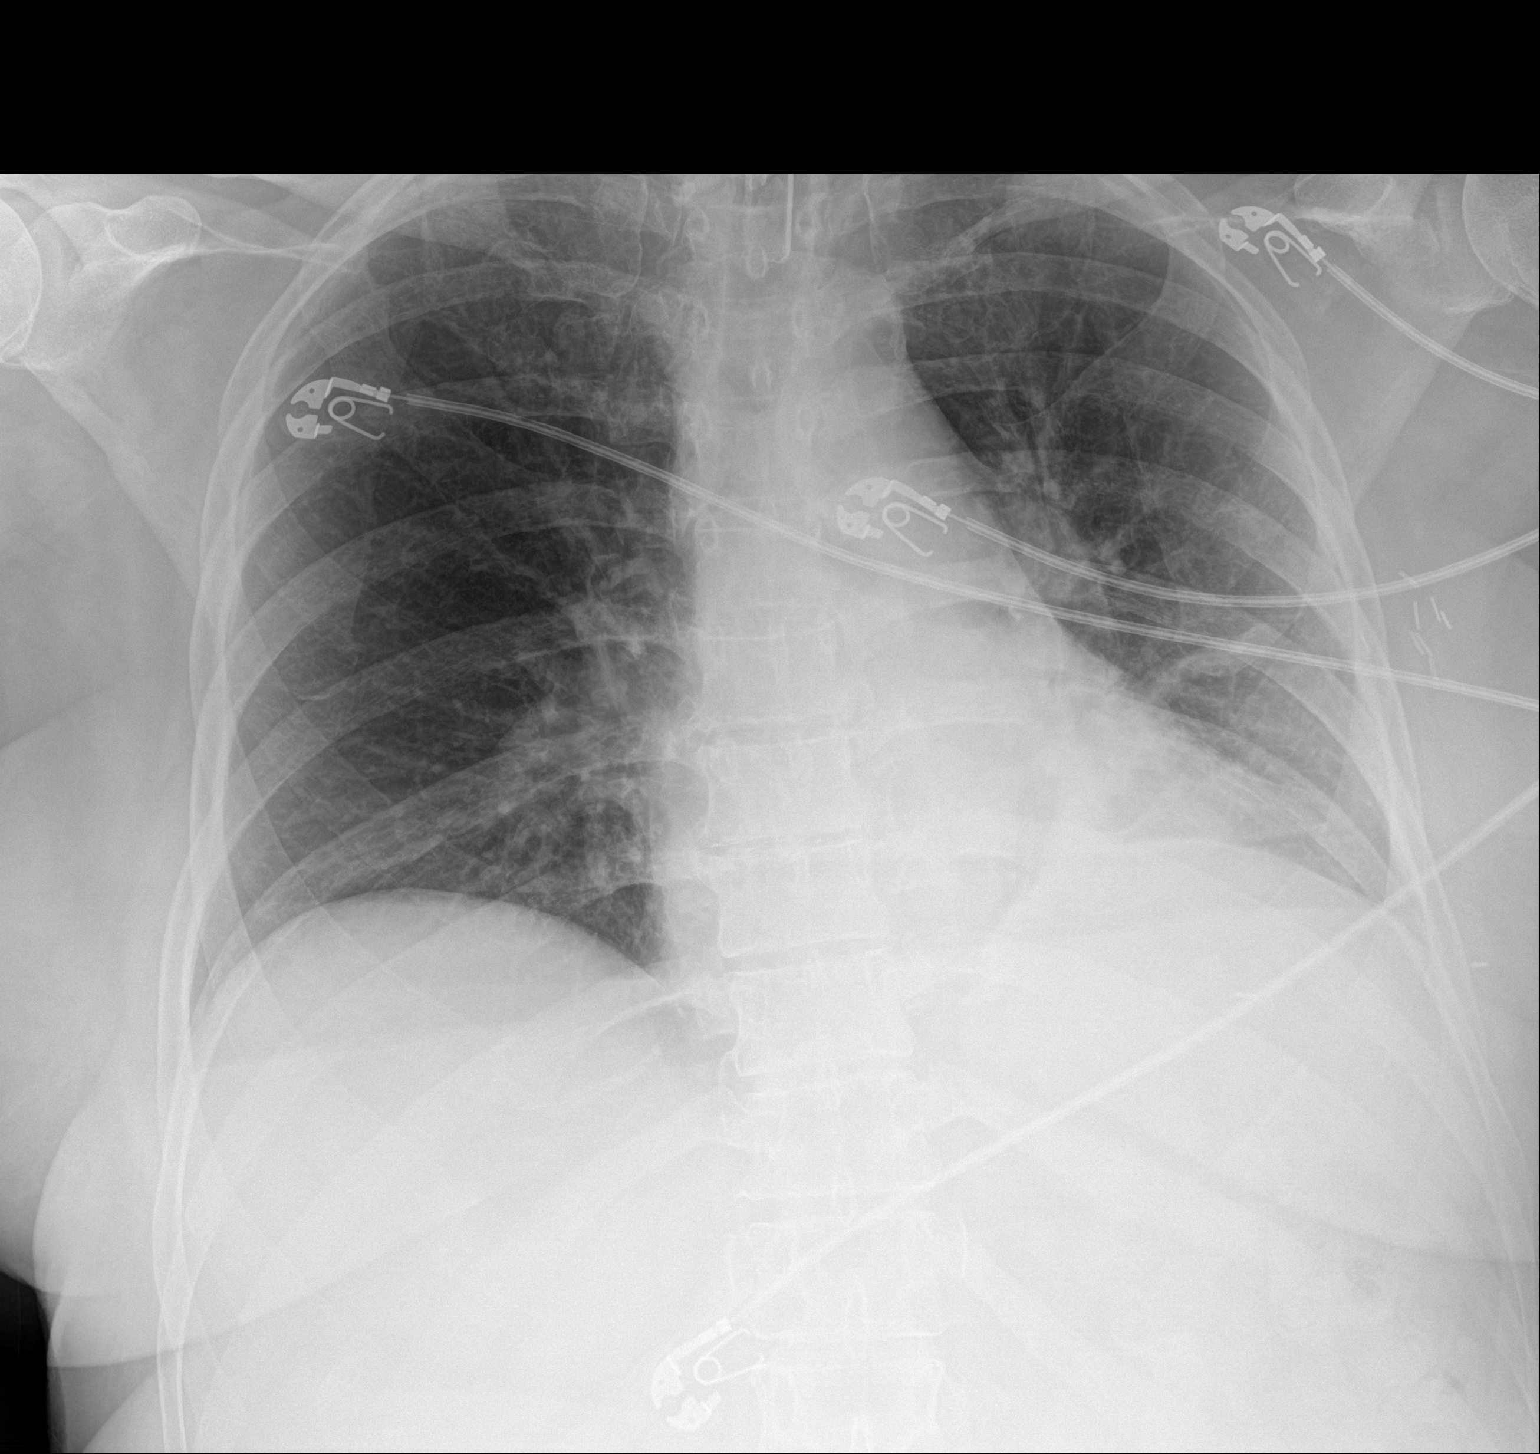

[1 of 1 positions shown; findings below may reference images not displayed]

FINDINGS: Endotracheal tube seen 6.4 cm above the carina. Mild left basilar
atelectasis. Lungs are otherwise clear. No pneumothorax or pleural
effusion. Cardiac size within normal limits. Pulmonary vascularity
is normal. No acute bone abnormality. Surgical clips noted within
the left axilla.
IMPRESSION: Mild left basilar atelectasis.

## 2023-07-16 IMAGING — XA IR TRANSCATH EMBOLIZATION
1 series · 10 of 24 positions shown · IV contrast (IODINE)
Comparison: None.

CLINICAL DATA: Recurrent epistaxis.

EXAM:
TRANSCATHETER THERAPY EMBOLIZATION
TECHNIQUE: Informed written consent was obtained from the patient's daughter
after a thorough discussion of the procedural risks, benefits and
alternatives. All questions were addressed. Maximal Sterile Barrier
Technique was utilized including caps, mask, sterile gowns, sterile
gloves, sterile drape, hand hygiene and skin antiseptic. A timeout
was performed prior to the initiation of the procedure.

[Series 300: dr. (person_name). · 10 of 453 slices shown]
[im 20/453]
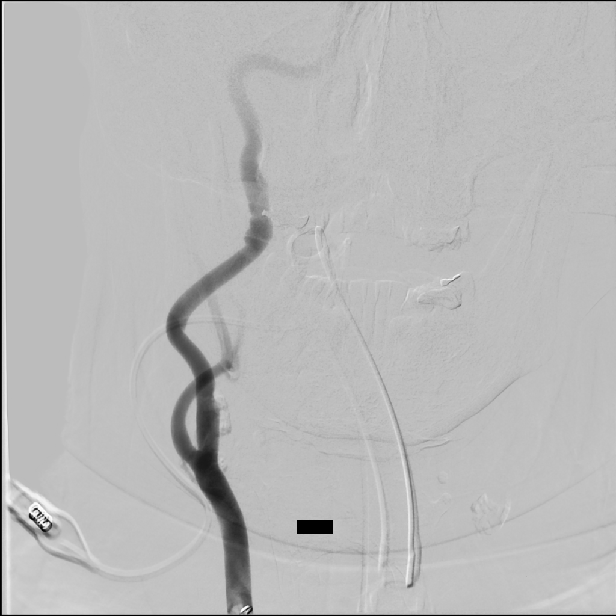
[im 59/453]
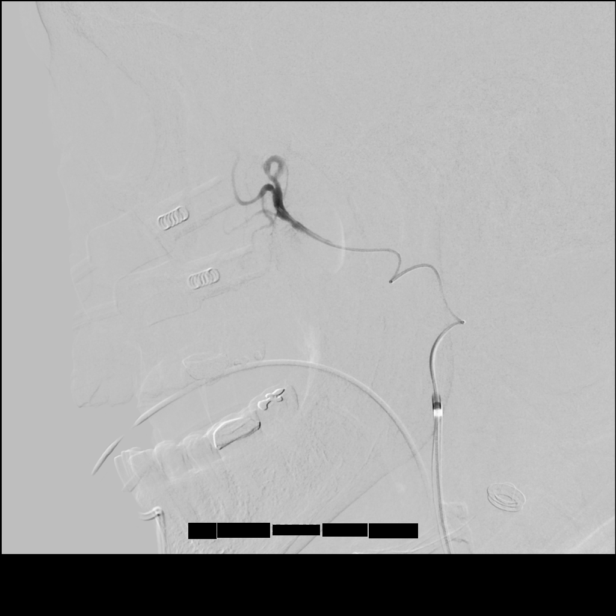
[im 118/453]
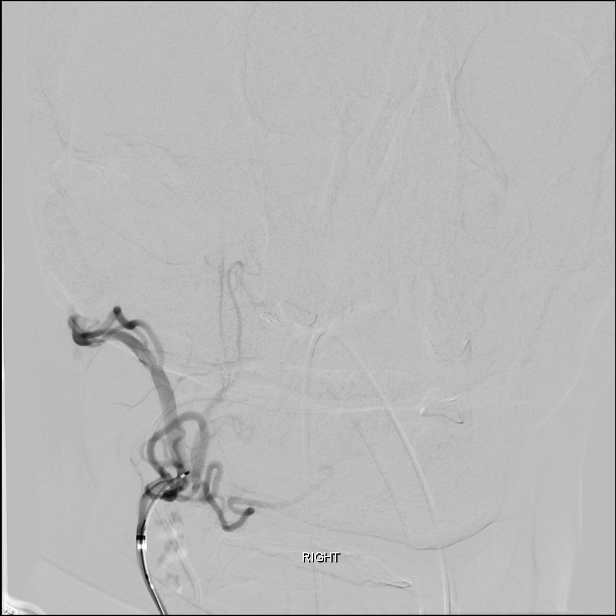
[im 158/453]
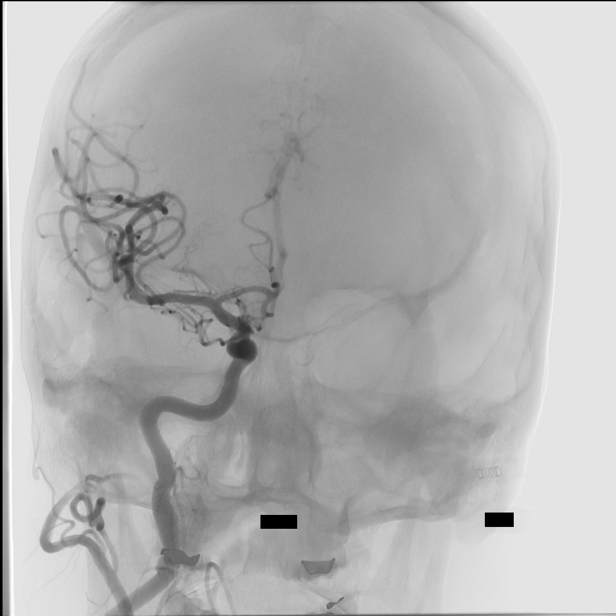
[im 197/453]
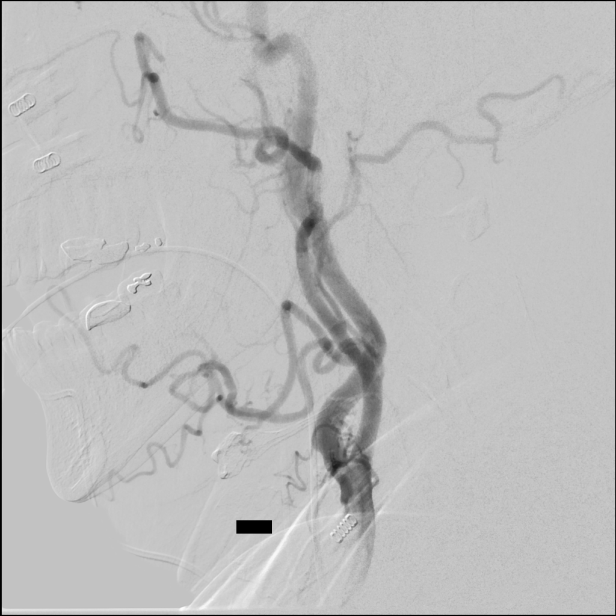
[im 256/453]
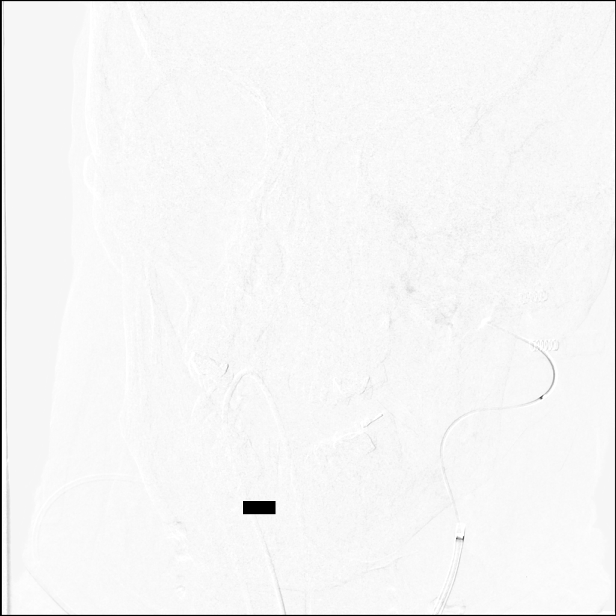
[im 295/453]
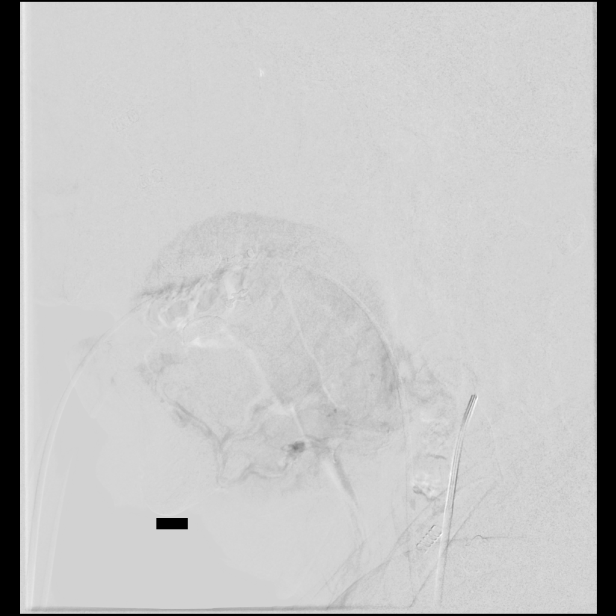
[im 354/453]
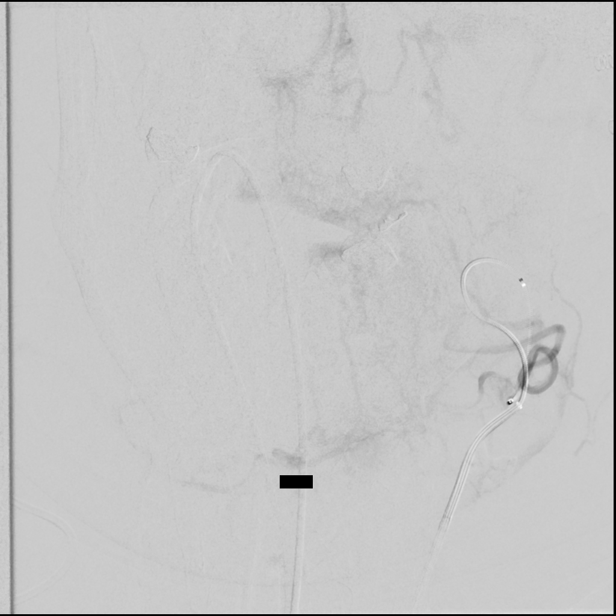
[im 394/453]
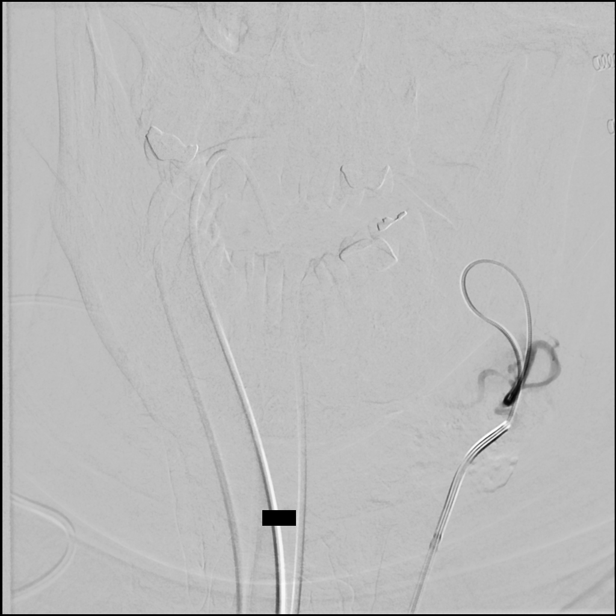
[im 433/453]
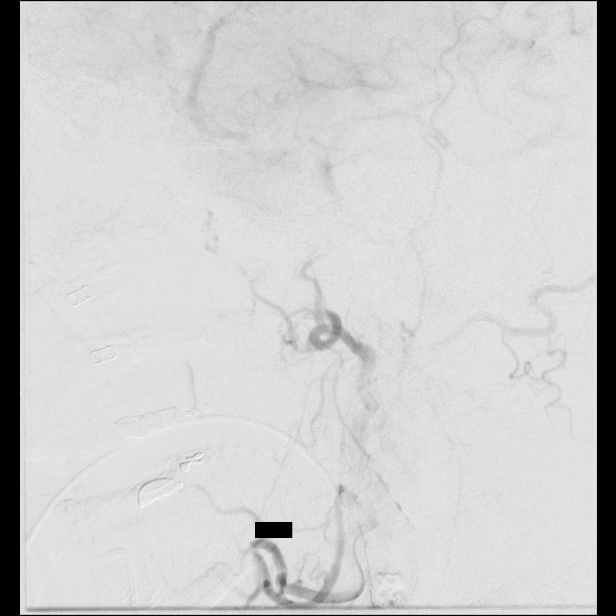

[10 of 24 positions shown; findings below may reference images not displayed]

MEDICATIONS:
Heparin 3,000 units IV. Ancef 2 g IV antibiotic was administered
within 1 hour of the procedure.

ANESTHESIA/SEDATION:
General anesthesia.

CONTRAST:  Omnipaque 300 approximately 95 mL, Omnipaque 350 80 mL.

FLUOROSCOPY TIME:  Fluoroscopy Time: 28 minutes 16 seconds (7699
mGy).

COMPLICATIONS:
None immediate.
The right groin was prepped and draped in the usual sterile fashion.
Thereafter using modified Seldinger technique, transfemoral access
into the right common femoral artery was obtained without
difficulty. Over a 0.035 inch guidewire, an 8 French 25 cm Pinnacle
sheath was inserted. Through this, and also over 0.035 inch
guidewire, a 5 French JB 1 catheter was advanced to the aortic arch
region and selectively positioned in the right common carotid
artery, the right external carotid artery, left common carotid
artery and the left external carotid artery.
FINDINGS: The innominate arteriogram demonstrates mild stenosis of the
proximal right subclavian artery.

The right vertebral artery origin demonstrates patency. The vessel
is seen to ascend normally to the cranial skull base. Patency is
noted of the proximal right common carotid artery.

Right common carotid arteriogram demonstrates the right external
carotid artery and its major branches to be patent.

The right internal carotid artery at the bulb to the cranial skull
base is widely patent with mild FMD changes noted at the junction of
proximal and mid [DATE] the right internal carotid artery.

More distally the petrous, the cavernous and the supraclinoid
segments demonstrate wide patency.

The right middle cerebral artery and the right A2 anterior cerebral
artery opacify into the capillary and venous phases. Transient
opacification of the anterior communicating artery is evident.

The right external carotid arteriogram demonstrates no gross
evidence of extravasation of contrast.

However, there is abnormal prominence of the distal branches of the
right internal maxillary artery.

ENDOVASCULAR SUPRA SELECTIVE EMBOLIZATION OF THE DISTAL RIGHT
INTERNAL MAXILLARY ARTERY BRANCHES WITH PVA PARTICLES SIZES 250-355
MICRONS, AND 355-500 MICRONS TO STASIS

The diagnostic JB 1 catheter in the right common carotid artery was
then exchanged over a 0.035 inch 300 cm Rosen exchange guidewire for
a 95 cm 6 French Envoy MDP catheter, which was advanced into the
proximal right external carotid artery.

The guidewire was removed. Good aspiration obtained from the hub of
the 6 French guide catheter. A gentle control arteriogram performed
through this demonstrated no evidence of spasm, dissection or of
intraluminal filling defects.

Over an 014 inch standard Synchro micro guidewire with a moderate J
configuration, an 027 Rapid Transit 2 tip microcatheter was then
advanced under biplane roadmap control and constant heparinized
saline infusion to the distal right internal maxillary artery just
proximal to the infraorbital artery.

The guidewire was removed. Good aspiration obtained from the hub of
the Rapid Transit microcatheter. A gentle control arteriogram
performed through this demonstrated brisk flow into the branches of
the internal maxillary artery distally. No evidence of dangerous
communications with the internal carotid artery, or opacification of
the ophthalmic artery.

Thereafter, PVA particle sizes 250-355 microns, and 355-500 microns
mixed in with 75% contrast and 25% heparinized saline infusion was
injected intermittently under intermittent fluoroscopy until the
stasis was evident.

This procedure was repeated from distal to proximal fashion. Stasis
was noted within the internal maxillary artery.

The microcatheter was then retrieved more proximally into the
proximal external carotid artery. Control arteriogram performed
through 6 French Envoy guide catheter demonstrated no contrast blush
from branches emanating from the right facial artery.

A control arteriogram performed through the guide catheter in the
right common carotid artery demonstrated no occlusions, or of
intraluminal filling defects in the right middle cerebral artery and
right anterior cerebral artery distributions.

Access into the left common carotid artery was then obtained in the
fashion described above.

Again the 6 French MDP guide catheter was advanced to the left
common carotid artery proximal to the bifurcation.

Control arteriogram performed through this demonstrated the left
external carotid artery and its major branches to be widely patent.

The left internal carotid artery at the bulb to the cranial skull
base demonstrates wide patency.

The petrous, the cavernous and the supraclinoid segments were
patent.

The left middle artery and the left anterior cerebral artery
opacified into the capillary and venous phases.

The 6 French envoy guide catheter was then advanced into the
proximal left external carotid artery. Control arteriogram performed
through this demonstrated prominence of the left internal maxillary
distal branches, specifically the sphenopalatine artery and the
descending palatine and intraorbital branches.

Also demonstrated was extensive blush with corkscrew appearance of
the distal facial branches in the anterior nasal region.

Using biplane roadmap technique and constant fluoroscopic guidance
in a coaxial manner and with constant heparinized saline infusion,
and over a 0.014 inch standard Synchro micro guidewire with a
moderate J configuration, an 027 Rapid Transit microcatheter was
then advanced to the distal internal maxillary artery sphenopalatine
branch. The guidewire was removed. Good aspiration obtained from the
hub of the microcatheter. Gentle control arteriogram performed
through this demonstrated no evidence of dangerous communications.

Super selective embolization was then performed using 250-355 micron
PVA particles, and 355-500 micron PVA particles mixed with 75%
contrast and 25% heparinized saline infusion.

Intermittent injections were performed under biplane intermittent
fluoroscopy to complete stasis in the distal to proximal fashion.

Control arteriogram performed through the 6 French guide catheter in
the left external carotid artery demonstrated complete stasis of the
left internal maxillary artery embolized vessels.

Similarly, access into the left facial artery was obtained over a
0.014 inch standard Synchro micro guidewire followed by advancement
of the Rapid Transit microcatheter using biplane roadmap technique
to the distal portion of the arterial feeder projecting into the
hyperemic nasal region.

A gentle control arteriogram through the microcatheter was performed
to ensure no dangerous communications.

None were observed.

Again PVA particles 250-355 microns, and 355-500 microns were then
injected in a control manner intermittently under biplane
intermittent fluoroscopy, to complete stasis.

A control arteriogram performed through the 6 French guide catheter
in the left external carotid artery demonstrated complete stasis
within the 2 major branches embolized.

The microcatheter was removed. The Envoy guide catheter was
retrieved into the left common carotid artery. Control arteriogram
performed through this demonstrated no change in the left internal
carotid artery extra cranially or intracranially.

No evidence of intraluminal filling defects or of occlusions were
noted in the left MCA and left ACA distributions.

The 6 French guide catheter was removed. The 8 Simon L Dixen
was removed with successful hemostasis at the right groin puncture
site with an 8 French Angio-Seal closure device.

Distal pulses remained Dopplerable in both feet unchanged.

Patient was left intubated because of positive COVID status, and
returned to the neuro ICU in stable condition.
IMPRESSION: Status post endovascular supra selective embolization of the right
internal maxillary artery branches, and of the left internal
maxillary artery branches as above, and also of the left facial
artery distal branches using PVA particles sizes 250-355 microns,
and 355-500 microns with stasis in the embolized branches.

PLAN:
Follow-up as per referring [place].

## 2023-07-16 NOTE — Progress Notes (Signed)
Patient here for retacrit injection today. Hgb 11.2, so she does not not meet parameters. Labs printed and given to patient; she stated understanding.

## 2023-07-18 DIAGNOSIS — K080 Exfoliation of teeth due to systemic causes: Secondary | ICD-10-CM | POA: Diagnosis not present

## 2023-07-23 ENCOUNTER — Other Ambulatory Visit (HOSPITAL_COMMUNITY): Payer: Self-pay | Admitting: Nephrology

## 2023-07-23 DIAGNOSIS — R768 Other specified abnormal immunological findings in serum: Secondary | ICD-10-CM

## 2023-07-23 NOTE — Progress Notes (Unsigned)
Mugweru, Cletis [place], MD  Leodis Rains D PROCEDURE / BIOPSY REVIEW Date: 07/23/23  Requested Biopsy site: Kidney Reason for request: Pos P-ANCA Imaging review: I reviewed all pertinent diagnostic studies  Decision: Approved Imaging modality to perform: Ultrasound Schedule with: Moderate Sedation Schedule for: Any VIR  Additional comments: @Schedulers . [place] NT / Random Renal Bx. CBC, PT/INR on procedure day.  Please contact me with questions, concerns, or if issue pertaining to this request arise.  Roanna Banning, MD [facility]

## 2023-08-06 ENCOUNTER — Other Ambulatory Visit (HOSPITAL_COMMUNITY): Payer: Self-pay | Admitting: Radiology

## 2023-08-06 ENCOUNTER — Other Ambulatory Visit: Payer: Self-pay | Admitting: Radiology

## 2023-08-06 DIAGNOSIS — R809 Proteinuria, unspecified: Secondary | ICD-10-CM

## 2023-08-07 ENCOUNTER — Encounter (HOSPITAL_COMMUNITY): Payer: Self-pay

## 2023-08-07 ENCOUNTER — Ambulatory Visit (HOSPITAL_COMMUNITY)
Admission: RE | Admit: 2023-08-07 | Discharge: 2023-08-07 | Disposition: A | Discharge status: Home / Self Care | Payer: Medicare Other | Source: Ambulatory Visit | Attending: Nephrology | Admitting: Nephrology

## 2023-08-07 ENCOUNTER — Other Ambulatory Visit: Payer: Self-pay

## 2023-08-07 DIAGNOSIS — I10 Essential (primary) hypertension: Secondary | ICD-10-CM | POA: Diagnosis not present

## 2023-08-07 DIAGNOSIS — Z5309 Procedure and treatment not carried out because of other contraindication: Secondary | ICD-10-CM | POA: Diagnosis not present

## 2023-08-07 DIAGNOSIS — E785 Hyperlipidemia, unspecified: Secondary | ICD-10-CM | POA: Diagnosis not present

## 2023-08-07 DIAGNOSIS — Z853 Personal history of malignant neoplasm of breast: Secondary | ICD-10-CM | POA: Diagnosis not present

## 2023-08-07 DIAGNOSIS — Z9889 Other specified postprocedural states: Secondary | ICD-10-CM | POA: Diagnosis not present

## 2023-08-07 DIAGNOSIS — R809 Proteinuria, unspecified: Secondary | ICD-10-CM | POA: Insufficient documentation

## 2023-08-07 DIAGNOSIS — R768 Other specified abnormal immunological findings in serum: Secondary | ICD-10-CM | POA: Insufficient documentation

## 2023-08-07 LAB — CBC
HCT: 32.3 % — ABNORMAL LOW (ref 36.0–46.0)
Hemoglobin: 10.6 g/dL — ABNORMAL LOW (ref 12.0–15.0)
MCH: 32.2 pg (ref 26.0–34.0)
MCHC: 32.8 g/dL (ref 30.0–36.0)
MCV: 98.2 fL (ref 80.0–100.0)
Platelets: 282 10*3/uL (ref 150–400)
RBC: 3.29 MIL/uL — ABNORMAL LOW (ref 3.87–5.11)
RDW: 15.8 % — ABNORMAL HIGH (ref 11.5–15.5)
WBC: 4.8 10*3/uL (ref 4.0–10.5)
nRBC: 0 % (ref 0.0–0.2)

## 2023-08-07 LAB — PROTIME-INR
INR: 0.9 (ref 0.8–1.2)
Prothrombin Time: 12.6 s (ref 11.4–15.2)

## 2023-08-07 MED ORDER — SODIUM CHLORIDE 0.9% FLUSH: 10.0000 mL | Freq: Two times a day (BID) | INTRAVENOUS | Status: DC

## 2023-08-07 NOTE — H&P (Signed)
Chief Complaint: Patient was seen in consultation today for random renal biopsy at the request of Singh,Vikas  Referring Physician(s): Singh,Vikas  Supervising Physician: Roanna Banning  Patient Status: [facility] - Out-pt  History of Present Illness: Karen Mills is a 67 y.o. female   FULL Code status per pt Hx Breast Ca 2020 HTN; HLD  Has followed with Dr Thedore Mins for proteinuria P-ANCA titer pos  Scheduled for random renal biopsy in IR   Past Medical History:  Diagnosis Date   Breast cancer (HCC) 2020   Left Breast Cancer   CKD (chronic kidney disease)    Gout    Hyperlipidemia    Hypertension    Hypothyroidism    Megaloblastic anemia    Obesity    Personal history of radiation therapy 2020   Left Breast Cancer   PONV (postoperative nausea and vomiting)    SCC (squamous cell carcinoma) Keratoacanthoma 10/10/2016   Right Shin (Cx3,5FU)   Squamous cell carcinoma in situ (SCCIS) 11/15/2016   Right Lower Shin (tx p bx)   Varicose veins     Past Surgical History:  Procedure Laterality Date   ABLATION ON ENDOMETRIOSIS     BREAST LUMPECTOMY Left 04/30/2019   BREAST LUMPECTOMY WITH RADIOACTIVE SEED AND SENTINEL LYMPH NODE BIOPSY Left 04/30/2019   Procedure: LEFT BREAST LUMPECTOMY WITH RADIOACTIVE SEED AND SENTINEL LYMPH NODE BIOPSY;  Surgeon: Griselda Miner, MD;  Location: [facility];  Service: General;  Laterality: Left;   FOOT SURGERY     IR ANGIO EXTERNAL CAROTID SEL EXT CAROTID BILAT MOD SED  12/01/2021   IR ANGIO INTRA EXTRACRAN SEL COM CAROTID INNOMINATE BILAT MOD SED  12/01/2021   IR ANGIO VERTEBRAL SEL VERTEBRAL UNI R MOD SED  12/01/2021   IR ANGIOGRAM FOLLOW UP STUDY  12/01/2021   IR ANGIOGRAM FOLLOW UP STUDY  12/01/2021   IR ANGIOGRAM FOLLOW UP STUDY  12/01/2021   IR NEURO EACH ADD'L AFTER BASIC UNI LEFT (MS)  12/01/2021   IR NEURO EACH ADD'L AFTER BASIC UNI RIGHT (MS)  12/01/2021   IR NEURO EACH ADD'L AFTER BASIC UNI RIGHT (MS)  12/01/2021   IR  TRANSCATH/EMBOLIZ  12/01/2021   IR TRANSCATH/EMBOLIZ  12/01/2021   RADIOLOGY WITH ANESTHESIA N/A 12/01/2021   Procedure: RADIOLOGY WITH ANESTHESIA;  Surgeon: Julieanne Cotton, MD;  Location: [facility] OR;  Service: Radiology;  Laterality: N/A;   TONSILLECTOMY     vein removal      Allergies: Accupril [quinapril hcl], Amlodipine besylate, Atacand [candesartan], Atorvastatin, Cozaar [losartan potassium], Hydrochlorothiazide w-triamterene, Tiazac [diltiazem hcl er beads], and Zestril [lisinopril]  Medications: Prior to Admission medications   Medication Sig Start Date End Date Taking? Authorizing Provider  allopurinol (ZYLOPRIM) 300 MG tablet Take 300 mg by mouth every morning. 10/24/21  Yes [provider]  B Complex-C (B-COMPLEX WITH VITAMIN C) tablet Take 1 tablet by mouth daily. 01/04/22  Yes [provider]  Biotin 16109 MCG TABS Take 10,000 mcg by mouth every morning.   Yes [provider]  carvedilol (COREG) 6.25 MG tablet Take 6.25 mg by mouth 2 (two) times daily. 10/24/21  Yes [provider]  cholecalciferol (VITAMIN D3) 25 MCG (1000 UT) tablet Take 1,000 Units by mouth every morning.   Yes [provider]  colchicine 0.6 MG tablet Take 1 tablet (0.6 mg total) by mouth 2 (two) times daily. 05/15/23  Yes Regal, Kirstie Peri, DPM  hydrALAZINE (APRESOLINE) 100 MG tablet Take 100 mg by mouth daily.  Yes [provider]  Providence Lanius 1000 MG CAPS 1 capsule Orally once a day   Yes [provider]  levothyroxine (SYNTHROID) 100 MCG tablet Take 100 mcg by mouth daily before breakfast. 08/08/21  Yes [provider]  Omega-3 Fatty Acids (FISH OIL PO) Take 800 mg by mouth every morning.   Yes [provider]  rosuvastatin (CRESTOR) 20 MG tablet Take 1 tablet (20 mg total) by mouth daily. 11/05/22  Yes Orbie Pyo, MD  tamoxifen (NOLVADEX) 10 MG tablet TAKE ONE TABLET BY MOUTH DAILY 03/11/23  Yes Serena Croissant, MD  Vitamin E 670 MG  (1000 UT) CAPS Take 1,000 Units by mouth every morning.   Yes [provider]  Multiple Vitamins-Minerals (HAIR FORMULA EXTRA STRENGTH PO) 4 tablets Orally once a day    [provider]     Family History  Problem Relation Age of Onset   Heart disease Mother    Heart disease Father    Heart disease Brother    Pancreatic cancer Half-Brother        d. early 50s    Social History   Socioeconomic History   Marital status: Married    Spouse name: Not on file   Number of children: Not on file   Years of education: Not on file   Highest education level: Not on file  Occupational History   Occupation: retired  Tobacco Use   Smoking status: Never   Smokeless tobacco: Never  Vaping Use   Vaping status: Never Used  Substance and Sexual Activity   Alcohol use: Yes    Comment: 2-3x per week   Drug use: No   Sexual activity: Not on file  Other Topics Concern   Not on file  Social History Narrative   Not on file   Social Determinants of Health   Financial Resource Strain: Not on file  Food Insecurity: Not on file  Transportation Needs: No Transportation Needs (04/16/2019)   PRAPARE - Administrator, Civil Service (Medical): No    Lack of Transportation (Non-Medical): No  Physical Activity: Not on file  Stress: Not on file  Social Connections: Not on file    Review of Systems: A 12 point ROS discussed and pertinent positives are indicated in the HPI above.  All other systems are negative.  Review of Systems  Constitutional:  Negative for activity change, fatigue and fever.  Respiratory:  Negative for cough and shortness of breath.   Cardiovascular:  Negative for chest pain.  Gastrointestinal:  Negative for abdominal pain.  Neurological:  Negative for weakness.  Psychiatric/Behavioral:  Negative for behavioral problems and confusion.     Vital Signs: Ht 5\' 8"  (1.727 m)   Wt 205 lb (93 kg)   BMI 31.17 kg/m   Advance Care Plan: The advanced  care plan/surrogate decision maker was discussed at the time of visit and documented in the medical record.    Physical Exam Vitals reviewed.  HENT:     Mouth/Throat:     Mouth: Mucous membranes are moist.  Cardiovascular:     Rate and Rhythm: Normal rate and regular rhythm.     Heart sounds: Murmur heard.  Pulmonary:     Effort: Pulmonary effort is normal.     Breath sounds: Normal breath sounds.  Abdominal:     Palpations: Abdomen is soft.     Tenderness: There is no abdominal tenderness.  Musculoskeletal:        General: Normal  range of motion.     Right lower leg: No edema.     Left lower leg: No edema.  Skin:    General: Skin is warm.  Neurological:     Mental Status: She is alert and oriented to person, place, and time.  Psychiatric:        Behavior: Behavior normal.     Imaging: No results found.  Labs:  CBC: Recent Labs    05/07/23 0921 05/28/23 0836 06/19/23 0711 07/16/23 0853  WBC 3.9* 4.6 4.1 5.3  HGB 10.5* 10.3* 10.4* 11.2*  HCT 30.9* 31.2* 31.7* 33.6*  PLT 288 271 296 256    COAGS: No results for input(s): "INR", "APTT" in the last 8760 hours.  BMP: Recent Labs    05/07/23 0921 05/28/23 0836 06/19/23 0711 07/16/23 0853  NA 129* 135 133* 138  K 3.9 4.0 3.8 3.8  CL 100 107 103 109  CO2 21* 21* 23 21*  GLUCOSE 88 102* 109* 61*  BUN 23 23 23  26*  CALCIUM 9.4 9.2 9.2 9.2  CREATININE 1.07* 1.08* 0.98 1.34*  GFRNONAA 57* 56* >60 43*    LIVER FUNCTION TESTS: Recent Labs    05/07/23 0921 05/28/23 0836 06/19/23 0711 07/16/23 0853  BILITOT 0.6 0.6 0.6 0.6  AST 17 16 15 15   ALT 10 10 9 14   ALKPHOS 75 69 66 66  PROT 6.9 6.9 6.9 6.3*  ALBUMIN 3.7 3.6 3.7 3.5    TUMOR MARKERS: No results for input(s): "AFPTM", "CEA", "CA199", "CHROMGRNA" in the last 8760 hours.  Assessment and Plan:  Scheduled for random renal biopsy in IR Risks and benefits of random renal biopsy was discussed with the patient and/or patient's family including,  but not limited to bleeding, infection, damage to adjacent structures or low yield requiring additional tests.  All of the questions were answered and there is agreement to proceed.  Consent signed and in chart.  Thank you for this interesting consult.  I greatly enjoyed meeting TRISH HETU and look forward to participating in their care.  A copy of this report was sent to the requesting provider on this date.  Electronically Signed: Robet Leu, PA-C 08/07/2023, 6:40 AM   I spent a total of  30 Minutes   in face to face in clinical consultation, greater than 50% of which was counseling/coordinating care for random renal bx

## 2023-08-07 NOTE — Progress Notes (Signed)
   IR Note  Pt was scheduled for Random renal biopsy BP this am 190s/90s She did take morning meds  Discussed with Dr Llana Aliment safely proceed  Please tune up BP and will need to re order when better controlled  Thank you  Pt aware and agreeable to plan

## 2023-08-13 ENCOUNTER — Inpatient Hospital Stay: Payer: Medicare Other | Attending: Hematology and Oncology

## 2023-08-13 ENCOUNTER — Inpatient Hospital Stay: Payer: Medicare Other

## 2023-08-13 DIAGNOSIS — Z170 Estrogen receptor positive status [ER+]: Secondary | ICD-10-CM | POA: Insufficient documentation

## 2023-08-13 DIAGNOSIS — C50512 Malignant neoplasm of lower-outer quadrant of left female breast: Secondary | ICD-10-CM | POA: Diagnosis not present

## 2023-08-13 DIAGNOSIS — D649 Anemia, unspecified: Secondary | ICD-10-CM

## 2023-08-13 DIAGNOSIS — N182 Chronic kidney disease, stage 2 (mild): Secondary | ICD-10-CM

## 2023-08-13 LAB — CMP (CANCER CENTER ONLY)
ALT: 14 U/L (ref 0–44)
AST: 17 U/L (ref 15–41)
Albumin: 3.7 g/dL (ref 3.5–5.0)
Alkaline Phosphatase: 71 U/L (ref 38–126)
Anion gap: 7 (ref 5–15)
BUN: 20 mg/dL (ref 8–23)
CO2: 23 mmol/L (ref 22–32)
Calcium: 9.3 mg/dL (ref 8.9–10.3)
Chloride: 105 mmol/L (ref 98–111)
Creatinine: 1.13 mg/dL — ABNORMAL HIGH (ref 0.44–1.00)
GFR, Estimated: 53 mL/min — ABNORMAL LOW (ref >60)
Glucose, Bld: 107 mg/dL — ABNORMAL HIGH (ref 70–99)
Potassium: 4.2 mmol/L (ref 3.5–5.1)
Sodium: 135 mmol/L (ref 135–145)
Total Bilirubin: 0.7 mg/dL (ref <1.2)
Total Protein: 6.5 g/dL (ref 6.5–8.1)

## 2023-08-13 LAB — CBC WITH DIFFERENTIAL (CANCER CENTER ONLY)
Abs Immature Granulocytes: 0.02 10*3/uL (ref 0.00–0.07)
Basophils Absolute: 0.1 10*3/uL (ref 0.0–0.1)
Basophils Relative: 1 %
Eosinophils Absolute: 0.1 10*3/uL (ref 0.0–0.5)
Eosinophils Relative: 2 %
HCT: 33.7 % — ABNORMAL LOW (ref 36.0–46.0)
Hemoglobin: 11.2 g/dL — ABNORMAL LOW (ref 12.0–15.0)
Immature Granulocytes: 0 %
Lymphocytes Relative: 17 %
Lymphs Abs: 0.9 10*3/uL (ref 0.7–4.0)
MCH: 33.2 pg (ref 26.0–34.0)
MCHC: 33.2 g/dL (ref 30.0–36.0)
MCV: 100 fL (ref 80.0–100.0)
Monocytes Absolute: 0.4 10*3/uL (ref 0.1–1.0)
Monocytes Relative: 8 %
Neutro Abs: 3.6 10*3/uL (ref 1.7–7.7)
Neutrophils Relative %: 72 %
Platelet Count: 275 10*3/uL (ref 150–400)
RBC: 3.37 MIL/uL — ABNORMAL LOW (ref 3.87–5.11)
RDW: 15.7 % — ABNORMAL HIGH (ref 11.5–15.5)
WBC Count: 5 10*3/uL (ref 4.0–10.5)
nRBC: 0 % (ref 0.0–0.2)

## 2023-08-13 NOTE — Progress Notes (Signed)
Patient here for retacrit injection, Hgb 11.2 today so she does not meet parameters. Labs printed and given to patient.

## 2023-08-19 DIAGNOSIS — I10 Essential (primary) hypertension: Secondary | ICD-10-CM | POA: Diagnosis not present

## 2023-08-23 DIAGNOSIS — N1831 Chronic kidney disease, stage 3a: Secondary | ICD-10-CM | POA: Diagnosis not present

## 2023-08-23 DIAGNOSIS — R768 Other specified abnormal immunological findings in serum: Secondary | ICD-10-CM | POA: Diagnosis not present

## 2023-08-23 DIAGNOSIS — R809 Proteinuria, unspecified: Secondary | ICD-10-CM | POA: Diagnosis not present

## 2023-08-23 DIAGNOSIS — I129 Hypertensive chronic kidney disease with stage 1 through stage 4 chronic kidney disease, or unspecified chronic kidney disease: Secondary | ICD-10-CM | POA: Diagnosis not present

## 2023-08-26 DIAGNOSIS — R768 Other specified abnormal immunological findings in serum: Secondary | ICD-10-CM | POA: Diagnosis not present

## 2023-08-26 DIAGNOSIS — N1831 Chronic kidney disease, stage 3a: Secondary | ICD-10-CM | POA: Diagnosis not present

## 2023-08-29 DIAGNOSIS — R768 Other specified abnormal immunological findings in serum: Secondary | ICD-10-CM | POA: Diagnosis not present

## 2023-08-29 DIAGNOSIS — I129 Hypertensive chronic kidney disease with stage 1 through stage 4 chronic kidney disease, or unspecified chronic kidney disease: Secondary | ICD-10-CM | POA: Diagnosis not present

## 2023-09-03 ENCOUNTER — Encounter: Payer: Self-pay | Admitting: Nephrology

## 2023-09-04 DIAGNOSIS — N1831 Chronic kidney disease, stage 3a: Secondary | ICD-10-CM | POA: Diagnosis not present

## 2023-09-04 DIAGNOSIS — I129 Hypertensive chronic kidney disease with stage 1 through stage 4 chronic kidney disease, or unspecified chronic kidney disease: Secondary | ICD-10-CM | POA: Diagnosis not present

## 2023-09-09 NOTE — Assessment & Plan Note (Signed)
Lab review: 12/12/2022: Ferritin 193, iron saturation 13%, hemoglobin 9.5, creatinine 1.42 01/15/2023: Hemoglobin 9.4, MCV 98.3, platelets 317 02/12/2023: Hemoglobin 9.7 03/27/2023: Hemoglobin 10 05/07/2023: Hemoglobin 10.5 (proceed with today's injection) 05/28/2023: Hemoglobin 10.3 06/19/2023: Hemoglobin 10.4   anemia of chronic kidney disease stage II-III. Current treatment: Retacrit 40,000 units every 3 weeks started 01/22/2023 Goal hemoglobin: 10.5 g   Follow-up every 3 weeks for lab and injections and then in 9 weeks for follow-up with me

## 2023-09-09 NOTE — Assessment & Plan Note (Signed)
04/15/2019:Screening mammogram detected 0.4cm mass in the left breast at the 6 o'clock position with no left axillary adenopathy. Biopsy on 04/03/19 showed IDC, grade 1-2, HER-2 - (0), ER +95%, PR+ 95%, Ki67 15%.  T1 a N0 stage Ia clinical stage   Treatment plan: 1.  Breast conserving surgery with sentinel lymph node biopsy 04/30/2019: Grade 1 IDC 0.8 cm, 0/3 lymph nodes, ER 95%, PR 95%, KI 6715%, HER-2 negative, T1BN0 stage Ia 2.  Adjuvant radiation therapy 06/02/2019- 3.  Follow-up adjuvant antiestrogen therapy with anastrozole 1 mg daily x5 years -------------------------------------------------------------------------------------------------------------------- Current treatment: Adjuvant antiestrogen therapy with anastrozole 1 mg daily started 07/09/2019 (discontinued due to severe joint aches and pains) switched to tamoxifen 10 mg started 01/11/2020 .   Tamoxifen toxicities: Tolerating it extremely well We will order BCI to determine if she would need extended endocrine therapy  Breast cancer surveillance: Mammogram 04/10/2023: Benign, breast density category B

## 2023-09-10 ENCOUNTER — Encounter: Payer: Self-pay | Admitting: *Deleted

## 2023-09-10 ENCOUNTER — Inpatient Hospital Stay: Payer: Medicare Other | Admitting: Hematology and Oncology

## 2023-09-10 ENCOUNTER — Ambulatory Visit: Payer: Medicare Other

## 2023-09-10 ENCOUNTER — Inpatient Hospital Stay: Payer: Medicare Other | Attending: Hematology and Oncology

## 2023-09-10 VITALS — BP 181/66 | HR 64 | Temp 97.3°F | Resp 18 | Ht 68.0 in | Wt 211.4 lb

## 2023-09-10 DIAGNOSIS — C50512 Malignant neoplasm of lower-outer quadrant of left female breast: Secondary | ICD-10-CM | POA: Insufficient documentation

## 2023-09-10 DIAGNOSIS — D649 Anemia, unspecified: Secondary | ICD-10-CM | POA: Diagnosis not present

## 2023-09-10 DIAGNOSIS — Z923 Personal history of irradiation: Secondary | ICD-10-CM | POA: Diagnosis not present

## 2023-09-10 DIAGNOSIS — Z79811 Long term (current) use of aromatase inhibitors: Secondary | ICD-10-CM | POA: Insufficient documentation

## 2023-09-10 DIAGNOSIS — D631 Anemia in chronic kidney disease: Secondary | ICD-10-CM | POA: Insufficient documentation

## 2023-09-10 DIAGNOSIS — N1832 Chronic kidney disease, stage 3b: Secondary | ICD-10-CM | POA: Insufficient documentation

## 2023-09-10 DIAGNOSIS — I129 Hypertensive chronic kidney disease with stage 1 through stage 4 chronic kidney disease, or unspecified chronic kidney disease: Secondary | ICD-10-CM | POA: Insufficient documentation

## 2023-09-10 DIAGNOSIS — Z170 Estrogen receptor positive status [ER+]: Secondary | ICD-10-CM | POA: Diagnosis not present

## 2023-09-10 DIAGNOSIS — Z79899 Other long term (current) drug therapy: Secondary | ICD-10-CM | POA: Diagnosis not present

## 2023-09-10 DIAGNOSIS — N182 Chronic kidney disease, stage 2 (mild): Secondary | ICD-10-CM

## 2023-09-10 LAB — CMP (CANCER CENTER ONLY)
ALT: 16 U/L (ref 0–44)
AST: 21 U/L (ref 15–41)
Albumin: 3.8 g/dL (ref 3.5–5.0)
Alkaline Phosphatase: 61 U/L (ref 38–126)
Anion gap: 7 (ref 5–15)
BUN: 29 mg/dL — ABNORMAL HIGH (ref 8–23)
CO2: 25 mmol/L (ref 22–32)
Calcium: 9.5 mg/dL (ref 8.9–10.3)
Chloride: 104 mmol/L (ref 98–111)
Creatinine: 1.28 mg/dL — ABNORMAL HIGH (ref 0.44–1.00)
GFR, Estimated: 46 mL/min — ABNORMAL LOW (ref >60)
Glucose, Bld: 83 mg/dL (ref 70–99)
Potassium: 4.1 mmol/L (ref 3.5–5.1)
Sodium: 136 mmol/L (ref 135–145)
Total Bilirubin: 0.6 mg/dL (ref <1.2)
Total Protein: 6.5 g/dL (ref 6.5–8.1)

## 2023-09-10 LAB — CBC WITH DIFFERENTIAL (CANCER CENTER ONLY)
Abs Immature Granulocytes: 0.06 10*3/uL (ref 0.00–0.07)
Basophils Absolute: 0.1 10*3/uL (ref 0.0–0.1)
Basophils Relative: 1 %
Eosinophils Absolute: 0.2 10*3/uL (ref 0.0–0.5)
Eosinophils Relative: 2 %
HCT: 31.5 % — ABNORMAL LOW (ref 36.0–46.0)
Hemoglobin: 10.7 g/dL — ABNORMAL LOW (ref 12.0–15.0)
Immature Granulocytes: 1 %
Lymphocytes Relative: 20 %
Lymphs Abs: 1.3 10*3/uL (ref 0.7–4.0)
MCH: 34.1 pg — ABNORMAL HIGH (ref 26.0–34.0)
MCHC: 34 g/dL (ref 30.0–36.0)
MCV: 100.3 fL — ABNORMAL HIGH (ref 80.0–100.0)
Monocytes Absolute: 0.7 10*3/uL (ref 0.1–1.0)
Monocytes Relative: 11 %
Neutro Abs: 4.1 10*3/uL (ref 1.7–7.7)
Neutrophils Relative %: 65 %
Platelet Count: 309 10*3/uL (ref 150–400)
RBC: 3.14 MIL/uL — ABNORMAL LOW (ref 3.87–5.11)
RDW: 13.9 % (ref 11.5–15.5)
WBC Count: 6.4 10*3/uL (ref 4.0–10.5)
nRBC: 0 % (ref 0.0–0.2)

## 2023-09-10 MED ORDER — VALSARTAN 40 MG PO TABS: 40.0000 mg | ORAL_TABLET | Freq: Two times a day (BID) | ORAL | Status: DC

## 2023-09-10 NOTE — Progress Notes (Signed)
Per MD request RN successfully faxed BCI request to [phone].

## 2023-09-10 NOTE — Progress Notes (Signed)
Patient Care Team: Daisy Floro, MD as PCP - General (Family Medicine) Orbie Pyo, MD as PCP - Cardiology (Cardiology) Serena Croissant, MD as Consulting Physician (Hematology and Oncology) Dorothy Puffer, MD as Consulting Physician (Radiation Oncology) Griselda Miner, MD as Consulting Physician (General Surgery)  DIAGNOSIS:  Encounter Diagnoses  Name Primary?   Malignant neoplasm of lower-outer quadrant of left breast of female, estrogen receptor positive (HCC) Yes   Normocytic normochromic anemia     SUMMARY OF ONCOLOGIC HISTORY: Oncology History  Malignant neoplasm of lower-outer quadrant of left breast of female, estrogen receptor positive (HCC)  04/03/2019 Cancer Staging   Staging form: Breast, AJCC 8th Edition - Clinical stage from 04/03/2019: Stage IA (cT1a, cN0, cM0, G2, ER+, PR+, HER2-) - Signed by Loa Socks, NP on 04/15/2019   04/15/2019 Initial Diagnosis   Screening mammogram detected 0.4cm mass in the left breast at the 6 o'clock position with no left axillary adenopathy. Biopsy on 04/03/19 showed IDC, grade 1-2, HER-2 - (0), ER +95%, PR+ 95%, Ki67 15%.    04/30/2019 Surgery   Left lumpectomy Carolynne Edouard): IDC, grade 1, 0.8cm, intermediate grade DCIS, lymphovascular invasion present, clear margins. Three left axillary lymph nodes negative for carcinoma.    06/02/2019 -  Radiation Therapy   Adjuvant radiation   08/2019 -  Anti-estrogen oral therapy   Anastrozole daily stopped 10/2019 due to severe joint pain; switched to tamoxifen 10mg  on 01/11/20   10/06/2021 Genetic Testing   Negative hereditary cancer genetic testing: no pathogenic variants detected in [facility] CancerNext-Expanded +RNAinsight Panel.  Variants of uncertain significance detected in TSC1 at  p.L439V (c.1315C>G) and in TSC2 at p.D624N (c.1870G>A).  The report date is 10/06/2021.  The CancerNext-Expanded gene panel offered by [facility] and includes sequencing, rearrangement, and RNA analysis for  the following 77 genes: AIP, ALK, APC, ATM, AXIN2, BAP1, BARD1, BLM, BMPR1A, BRCA1, BRCA2, BRIP1, CDC73, CDH1, CDK4, CDKN1B, CDKN2A, CHEK2, CTNNA1, DICER1, FANCC, FH, FLCN, GALNT12, KIF1B, LZTR1, MAX, MEN1, MET, MLH1, MSH2, MSH3, MSH6, MUTYH, NBN, NF1, NF2, NTHL1, PALB2, PHOX2B, PMS2, POT1, PRKAR1A, PTCH1, PTEN, RAD51C, RAD51D, RB1, RECQL, RET, SDHA, SDHAF2, SDHB, SDHC, SDHD, SMAD4, SMARCA4, SMARCB1, SMARCE1, STK11, SUFU, TMEM127, TP53, TSC1, TSC2, VHL and XRCC2 (sequencing and deletion/duplication); EGFR, EGLN1, HOXB13, KIT, MITF, PDGFRA, POLD1, and POLE (sequencing only); EPCAM and GREM1 (deletion/duplication only).  UPDATE: TSC1 p.L439V VUS has been amended to Benign. Amended report date is 08/13/2022.     CHIEF COMPLIANT: Follow-up of anemia as well as history of breast cancer  HISTORY OF PRESENT ILLNESS:  History of Present Illness   The patient, with a history of stage 1A breast cancer, is currently on tamoxifen and has been for approximately four years. She expresses a desire to complete five years of therapy and then potentially discontinue the medication. She reports experiencing hot flashes, primarily at night, and joint pain, but it is unclear if these symptoms are due to the tamoxifen or age. She also has a history of bursitis in her hips.  The patient also has a history of anemia, for which she has been receiving injections. However, she has not received an injection for the past two visits due to her hemoglobin levels being above 11. She expresses a desire to monitor her hemoglobin levels and potentially discontinue the injections if her levels remain stable.  In addition, the patient has been working with another doctor to manage her blood pressure. She reports that her blood pressure has been high, despite taking medication.  She has recently started taking a new medication, Belsartan, twice a day.         ALLERGIES:  is allergic to accupril [quinapril hcl], amlodipine besylate,  atacand [candesartan], atorvastatin, cozaar [losartan potassium], hydrochlorothiazide w-triamterene, tiazac [diltiazem hcl er beads], and zestril [lisinopril].  MEDICATIONS:  Current Outpatient Medications  Medication Sig Dispense Refill   valsartan (DIOVAN) 40 MG tablet Take 1 tablet (40 mg total) by mouth 2 (two) times daily.     allopurinol (ZYLOPRIM) 300 MG tablet Take 300 mg by mouth every morning.     B Complex-C (B-COMPLEX WITH VITAMIN C) tablet Take 1 tablet by mouth daily.     Biotin 62130 MCG TABS Take 10,000 mcg by mouth every morning.     carvedilol (COREG) 6.25 MG tablet Take 6.25 mg by mouth 2 (two) times daily.     cholecalciferol (VITAMIN D3) 25 MCG (1000 UT) tablet Take 1,000 Units by mouth every morning.     colchicine 0.6 MG tablet Take 1 tablet (0.6 mg total) by mouth 2 (two) times daily. 30 tablet 1   Krill Oil 1000 MG CAPS 1 capsule Orally once a day     levothyroxine (SYNTHROID) 100 MCG tablet Take 100 mcg by mouth daily before breakfast.     Multiple Vitamins-Minerals (HAIR FORMULA EXTRA STRENGTH PO) 4 tablets Orally once a day     Omega-3 Fatty Acids (FISH OIL PO) Take 800 mg by mouth every morning.     rosuvastatin (CRESTOR) 20 MG tablet Take 1 tablet (20 mg total) by mouth daily. 90 tablet 3   tamoxifen (NOLVADEX) 10 MG tablet TAKE ONE TABLET BY MOUTH DAILY 90 tablet 3   Vitamin E 670 MG (1000 UT) CAPS Take 1,000 Units by mouth every morning.     No current facility-administered medications for this visit.    PHYSICAL EXAMINATION: ECOG PERFORMANCE STATUS: 1 - Symptomatic but completely ambulatory  Vitals:   09/10/23 1114  BP: (!) 181/66  Pulse: 64  Resp: 18  Temp: (!) 97.3 F (36.3 C)  SpO2: 99%   Filed Weights   09/10/23 1114  Weight: 211 lb 6.4 oz (95.9 kg)    Physical Exam          (exam performed in the presence of a chaperone)  LABORATORY DATA:  I have reviewed the data as listed    Latest Ref Rng & Units 09/10/2023    9:44 AM 08/13/2023     8:29 AM 07/16/2023    8:53 AM  CMP  Glucose 70 - 99 mg/dL 83  865  61   BUN 8 - 23 mg/dL 29  20  26    Creatinine 0.44 - 1.00 mg/dL 7.84  6.96  2.95   Sodium 135 - 145 mmol/L 136  135  138   Potassium 3.5 - 5.1 mmol/L 4.1  4.2  3.8   Chloride 98 - 111 mmol/L 104  105  109   CO2 22 - 32 mmol/L 25  23  21    Calcium 8.9 - 10.3 mg/dL 9.5  9.3  9.2   Total Protein 6.5 - 8.1 g/dL 6.5  6.5  6.3   Total Bilirubin <1.2 mg/dL 0.6  0.7  0.6   Alkaline Phos 38 - 126 U/L 61  71  66   AST 15 - 41 U/L 21  17  15    ALT 0 - 44 U/L 16  14  14      Lab Results  Component Value Date   WBC  6.4 09/10/2023   HGB 10.7 (L) 09/10/2023   HCT 31.5 (L) 09/10/2023   MCV 100.3 (H) 09/10/2023   PLT 309 09/10/2023   NEUTROABS 4.1 09/10/2023    ASSESSMENT & PLAN:  Malignant neoplasm of lower-outer quadrant of left breast of female, estrogen receptor positive (HCC) 04/15/2019:Screening mammogram detected 0.4cm mass in the left breast at the 6 o'clock position with no left axillary adenopathy. Biopsy on 04/03/19 showed IDC, grade 1-2, HER-2 - (0), ER +95%, PR+ 95%, Ki67 15%.  T1 a N0 stage Ia clinical stage   Treatment plan: 1.  Breast conserving surgery with sentinel lymph node biopsy 04/30/2019: Grade 1 IDC 0.8 cm, 0/3 lymph nodes, ER 95%, PR 95%, KI 6715%, HER-2 negative, T1BN0 stage Ia 2.  Adjuvant radiation therapy 06/02/2019- 3.  Follow-up adjuvant antiestrogen therapy with anastrozole 1 mg daily x5 years -------------------------------------------------------------------------------------------------------------------- Current treatment: Adjuvant antiestrogen therapy with anastrozole 1 mg daily started 07/09/2019 (discontinued due to severe joint aches and pains) switched to tamoxifen 10 mg started 01/11/2020 .   Tamoxifen toxicities: Tolerating it extremely well We will order BCI to determine if she would need extended endocrine therapy  Breast cancer surveillance: Mammogram 04/10/2023: Benign, breast  density category B     Normocytic normochromic anemia Lab review: 12/12/2022: Ferritin 193, iron saturation 13%, hemoglobin 9.5, creatinine 1.42 01/15/2023: Hemoglobin 9.4, MCV 98.3, platelets 317 02/12/2023: Hemoglobin 9.7 03/27/2023: Hemoglobin 10 05/07/2023: Hemoglobin 10.5 (proceed with today's injection) 05/28/2023: Hemoglobin 10.3 06/19/2023: Hemoglobin 10.4 09/10/2023: Hemoglobin 10.7   anemia of chronic kidney disease stage II-III. Current treatment: Retacrit 40,000 units every 3 weeks started 01/22/2023 (last injection was on 06/18/2023) Goal hemoglobin: 10.5 g Patient has not required Retacrit injections in 3 months.  Follow-up in 1 month for lab check and after that she will get labs every 2 months.  Hypertension Patient reports difficulty maintaining stable blood pressure levels. Recently started on Belsartan (lowest dose) by another provider, possibly taking twice daily due to high readings at home. -Continue management with current provider. Follow-up in January 2024.  General Health Maintenance -Continue monitoring liver function annually. No need for frequent testing as current results are normal. -Continue monitoring kidney function, creatinine levels fluctuating likely due to diuretic use.          Orders Placed This Encounter  Procedures   CBC with Differential (Cancer Center Only)    Standing Status:   Standing    Number of Occurrences:   30    Standing Expiration Date:   09/09/2024   The patient has a good understanding of the overall plan. she agrees with it. she will call with any problems that may develop before the next visit here. Total time spent: 30 mins including face to face time and time spent for planning, charting and co-ordination of care   Tamsen Meek, MD 09/10/23

## 2023-09-18 DIAGNOSIS — M7061 Trochanteric bursitis, right hip: Secondary | ICD-10-CM | POA: Diagnosis not present

## 2023-09-18 DIAGNOSIS — M7062 Trochanteric bursitis, left hip: Secondary | ICD-10-CM | POA: Diagnosis not present

## 2023-09-19 DIAGNOSIS — Z170 Estrogen receptor positive status [ER+]: Secondary | ICD-10-CM | POA: Diagnosis not present

## 2023-09-19 DIAGNOSIS — C50512 Malignant neoplasm of lower-outer quadrant of left female breast: Secondary | ICD-10-CM | POA: Diagnosis not present

## 2023-09-20 ENCOUNTER — Telehealth: Payer: Self-pay | Admitting: *Deleted

## 2023-09-20 NOTE — Telephone Encounter (Signed)
Per MD request, RN placed call to pt with BCI results showing pt would benefit from extended endocrine therapy with a 3.1% late distance recurrence rate within 5 years and 1.0-1.3% distant recurrence rate within 10 years.  No answer, LVM for pt to return call to the office.

## 2023-09-24 ENCOUNTER — Encounter (HOSPITAL_COMMUNITY): Payer: Self-pay

## 2023-09-25 ENCOUNTER — Telehealth: Payer: Self-pay | Admitting: Hematology and Oncology

## 2023-09-25 NOTE — Telephone Encounter (Signed)
I informed the patient that the breast cancer index came back as that she would benefit from extended endocrine therapy. If she were to stop the treatment here her risk of recurrence is 3.1% and with continuation is 1%. Since she is asymptomatic with regards to tamoxifen side effects, she is willing to continue the tamoxifen for the 5 more years.

## 2023-09-26 DIAGNOSIS — N1831 Chronic kidney disease, stage 3a: Secondary | ICD-10-CM | POA: Diagnosis not present

## 2023-09-26 DIAGNOSIS — M25552 Pain in left hip: Secondary | ICD-10-CM | POA: Diagnosis not present

## 2023-09-26 DIAGNOSIS — M25551 Pain in right hip: Secondary | ICD-10-CM | POA: Diagnosis not present

## 2023-09-30 ENCOUNTER — Ambulatory Visit: Payer: Medicare Other | Admitting: Hematology and Oncology

## 2023-10-11 DIAGNOSIS — M25552 Pain in left hip: Secondary | ICD-10-CM | POA: Diagnosis not present

## 2023-10-11 DIAGNOSIS — M25551 Pain in right hip: Secondary | ICD-10-CM | POA: Diagnosis not present

## 2023-10-15 ENCOUNTER — Inpatient Hospital Stay: Payer: Medicare Other

## 2023-10-15 ENCOUNTER — Inpatient Hospital Stay: Payer: Medicare Other | Attending: Hematology and Oncology

## 2023-10-15 DIAGNOSIS — Z170 Estrogen receptor positive status [ER+]: Secondary | ICD-10-CM | POA: Diagnosis not present

## 2023-10-15 DIAGNOSIS — C50512 Malignant neoplasm of lower-outer quadrant of left female breast: Secondary | ICD-10-CM | POA: Insufficient documentation

## 2023-10-15 DIAGNOSIS — D649 Anemia, unspecified: Secondary | ICD-10-CM

## 2023-10-15 DIAGNOSIS — N182 Chronic kidney disease, stage 2 (mild): Secondary | ICD-10-CM

## 2023-10-15 LAB — CMP (CANCER CENTER ONLY)
ALT: 14 U/L (ref 0–44)
AST: 17 U/L (ref 15–41)
Albumin: 3.8 g/dL (ref 3.5–5.0)
Alkaline Phosphatase: 75 U/L (ref 38–126)
Anion gap: 9 (ref 5–15)
BUN: 23 mg/dL (ref 8–23)
CO2: 23 mmol/L (ref 22–32)
Calcium: 9.2 mg/dL (ref 8.9–10.3)
Chloride: 91 mmol/L — ABNORMAL LOW (ref 98–111)
Creatinine: 1.12 mg/dL — ABNORMAL HIGH (ref 0.44–1.00)
GFR, Estimated: 54 mL/min — ABNORMAL LOW (ref >60)
Glucose, Bld: 94 mg/dL (ref 70–99)
Potassium: 3.6 mmol/L (ref 3.5–5.1)
Sodium: 123 mmol/L — ABNORMAL LOW (ref 135–145)
Total Bilirubin: 0.8 mg/dL (ref 0.0–1.2)
Total Protein: 6.6 g/dL (ref 6.5–8.1)

## 2023-10-15 LAB — CBC WITH DIFFERENTIAL (CANCER CENTER ONLY)
Abs Immature Granulocytes: 0.21 10*3/uL — ABNORMAL HIGH (ref 0.00–0.07)
Basophils Absolute: 0.1 10*3/uL (ref 0.0–0.1)
Basophils Relative: 1 %
Eosinophils Absolute: 0.2 10*3/uL (ref 0.0–0.5)
Eosinophils Relative: 2 %
HCT: 31.4 % — ABNORMAL LOW (ref 36.0–46.0)
Hemoglobin: 11.2 g/dL — ABNORMAL LOW (ref 12.0–15.0)
Immature Granulocytes: 2 %
Lymphocytes Relative: 19 %
Lymphs Abs: 1.7 10*3/uL (ref 0.7–4.0)
MCH: 33.7 pg (ref 26.0–34.0)
MCHC: 35.7 g/dL (ref 30.0–36.0)
MCV: 94.6 fL (ref 80.0–100.0)
Monocytes Absolute: 0.7 10*3/uL (ref 0.1–1.0)
Monocytes Relative: 8 %
Neutro Abs: 5.9 10*3/uL (ref 1.7–7.7)
Neutrophils Relative %: 68 %
Platelet Count: 303 10*3/uL (ref 150–400)
RBC: 3.32 MIL/uL — ABNORMAL LOW (ref 3.87–5.11)
RDW: 12.1 % (ref 11.5–15.5)
WBC Count: 8.7 10*3/uL (ref 4.0–10.5)
nRBC: 0 % (ref 0.0–0.2)

## 2023-10-15 NOTE — Progress Notes (Signed)
 Pt. Here for Retacrit injection.  HGB 11.2!  No injection needed today.  Labs printed and given to pt.  Pt. Informed.

## 2023-10-18 ENCOUNTER — Inpatient Hospital Stay (HOSPITAL_COMMUNITY)
Admission: EM | Admit: 2023-10-18 | Discharge: 2023-10-21 | DRG: 641 | Disposition: A | Discharge status: Home / Self Care | Payer: Medicare Other | Attending: Family Medicine | Admitting: Family Medicine

## 2023-10-18 ENCOUNTER — Encounter (HOSPITAL_COMMUNITY): Payer: Self-pay

## 2023-10-18 ENCOUNTER — Ambulatory Visit (INDEPENDENT_AMBULATORY_CARE_PROVIDER_SITE_OTHER): Payer: Medicare Other

## 2023-10-18 ENCOUNTER — Other Ambulatory Visit: Payer: Self-pay

## 2023-10-18 DIAGNOSIS — E876 Hypokalemia: Secondary | ICD-10-CM | POA: Diagnosis not present

## 2023-10-18 DIAGNOSIS — Z79810 Long term (current) use of selective estrogen receptor modulators (SERMs): Secondary | ICD-10-CM | POA: Diagnosis not present

## 2023-10-18 DIAGNOSIS — Z6831 Body mass index (BMI) 31.0-31.9, adult: Secondary | ICD-10-CM | POA: Diagnosis not present

## 2023-10-18 DIAGNOSIS — R809 Proteinuria, unspecified: Secondary | ICD-10-CM | POA: Diagnosis not present

## 2023-10-18 DIAGNOSIS — E669 Obesity, unspecified: Secondary | ICD-10-CM | POA: Diagnosis not present

## 2023-10-18 DIAGNOSIS — Z800 Family history of malignant neoplasm of digestive organs: Secondary | ICD-10-CM

## 2023-10-18 DIAGNOSIS — E871 Hypo-osmolality and hyponatremia: Principal | ICD-10-CM | POA: Diagnosis present

## 2023-10-18 DIAGNOSIS — Z85828 Personal history of other malignant neoplasm of skin: Secondary | ICD-10-CM

## 2023-10-18 DIAGNOSIS — E878 Other disorders of electrolyte and fluid balance, not elsewhere classified: Secondary | ICD-10-CM | POA: Diagnosis present

## 2023-10-18 DIAGNOSIS — Z79890 Hormone replacement therapy (postmenopausal): Secondary | ICD-10-CM | POA: Diagnosis not present

## 2023-10-18 DIAGNOSIS — Z8249 Family history of ischemic heart disease and other diseases of the circulatory system: Secondary | ICD-10-CM

## 2023-10-18 DIAGNOSIS — I959 Hypotension, unspecified: Secondary | ICD-10-CM | POA: Diagnosis present

## 2023-10-18 DIAGNOSIS — E1122 Type 2 diabetes mellitus with diabetic chronic kidney disease: Secondary | ICD-10-CM | POA: Diagnosis not present

## 2023-10-18 DIAGNOSIS — I129 Hypertensive chronic kidney disease with stage 1 through stage 4 chronic kidney disease, or unspecified chronic kidney disease: Secondary | ICD-10-CM | POA: Diagnosis present

## 2023-10-18 DIAGNOSIS — E039 Hypothyroidism, unspecified: Secondary | ICD-10-CM | POA: Diagnosis not present

## 2023-10-18 DIAGNOSIS — Z853 Personal history of malignant neoplasm of breast: Secondary | ICD-10-CM | POA: Diagnosis not present

## 2023-10-18 DIAGNOSIS — Z923 Personal history of irradiation: Secondary | ICD-10-CM | POA: Diagnosis not present

## 2023-10-18 DIAGNOSIS — T502X5A Adverse effect of carbonic-anhydrase inhibitors, benzothiadiazides and other diuretics, initial encounter: Secondary | ICD-10-CM | POA: Diagnosis not present

## 2023-10-18 DIAGNOSIS — Z79899 Other long term (current) drug therapy: Secondary | ICD-10-CM

## 2023-10-18 DIAGNOSIS — E785 Hyperlipidemia, unspecified: Secondary | ICD-10-CM | POA: Diagnosis present

## 2023-10-18 DIAGNOSIS — M109 Gout, unspecified: Secondary | ICD-10-CM | POA: Diagnosis not present

## 2023-10-18 DIAGNOSIS — R768 Other specified abnormal immunological findings in serum: Secondary | ICD-10-CM | POA: Diagnosis not present

## 2023-10-18 DIAGNOSIS — Z888 Allergy status to other drugs, medicaments and biological substances status: Secondary | ICD-10-CM

## 2023-10-18 DIAGNOSIS — N1831 Chronic kidney disease, stage 3a: Secondary | ICD-10-CM | POA: Diagnosis present

## 2023-10-18 LAB — FERRITIN: Ferritin: 223 ng/mL (ref 11–307)

## 2023-10-18 LAB — TSH: TSH: 2.177 u[IU]/mL (ref 0.350–4.500)

## 2023-10-18 LAB — URINALYSIS, W/ REFLEX TO CULTURE (INFECTION SUSPECTED)
Bacteria, UA: NONE SEEN
Bilirubin Urine: NEGATIVE
Glucose, UA: NEGATIVE mg/dL
Hgb urine dipstick: NEGATIVE
Ketones, ur: NEGATIVE mg/dL
Leukocytes,Ua: NEGATIVE
Nitrite: NEGATIVE
Protein, ur: 30 mg/dL — AB
Specific Gravity, Urine: 1.003 — ABNORMAL LOW (ref 1.005–1.030)
pH: 7 (ref 5.0–8.0)

## 2023-10-18 LAB — PROTEIN / CREATININE RATIO, URINE
Creatinine, Urine: 20 mg/dL
Protein Creatinine Ratio: 0.95 mg/mg{creat} — ABNORMAL HIGH (ref 0.00–0.15)
Total Protein, Urine: 19 mg/dL

## 2023-10-18 LAB — CBG MONITORING, ED: Glucose-Capillary: 107 mg/dL — ABNORMAL HIGH (ref 70–99)

## 2023-10-18 LAB — IRON AND TIBC
Iron: 63 ug/dL (ref 28–170)
Saturation Ratios: 20 % (ref 10.4–31.8)
TIBC: 323 ug/dL (ref 250–450)
UIBC: 260 ug/dL

## 2023-10-18 LAB — COMPREHENSIVE METABOLIC PANEL
ALT: 17 U/L (ref 0–44)
AST: 21 U/L (ref 15–41)
Albumin: 3.4 g/dL — ABNORMAL LOW (ref 3.5–5.0)
Alkaline Phosphatase: 63 U/L (ref 38–126)
Anion gap: 12 (ref 5–15)
BUN: 21 mg/dL (ref 8–23)
CO2: 23 mmol/L (ref 22–32)
Calcium: 9.1 mg/dL (ref 8.9–10.3)
Chloride: 84 mmol/L — ABNORMAL LOW (ref 98–111)
Creatinine, Ser: 1.15 mg/dL — ABNORMAL HIGH (ref 0.44–1.00)
GFR, Estimated: 52 mL/min — ABNORMAL LOW (ref >60)
Glucose, Bld: 105 mg/dL — ABNORMAL HIGH (ref 70–99)
Potassium: 3.8 mmol/L (ref 3.5–5.1)
Sodium: 119 mmol/L — CL (ref 135–145)
Total Bilirubin: 1 mg/dL (ref 0.0–1.2)
Total Protein: 6.2 g/dL — ABNORMAL LOW (ref 6.5–8.1)

## 2023-10-18 LAB — NA AND K (SODIUM & POTASSIUM), RAND UR
Potassium Urine: 12 mmol/L
Sodium, Ur: 25 mmol/L

## 2023-10-18 LAB — CBC
HCT: 29.5 % — ABNORMAL LOW (ref 36.0–46.0)
Hemoglobin: 10.8 g/dL — ABNORMAL LOW (ref 12.0–15.0)
MCH: 34.4 pg — ABNORMAL HIGH (ref 26.0–34.0)
MCHC: 36.6 g/dL — ABNORMAL HIGH (ref 30.0–36.0)
MCV: 93.9 fL (ref 80.0–100.0)
Platelets: 301 10*3/uL (ref 150–400)
RBC: 3.14 MIL/uL — ABNORMAL LOW (ref 3.87–5.11)
RDW: 12.4 % (ref 11.5–15.5)
WBC: 8.2 10*3/uL (ref 4.0–10.5)
nRBC: 0 % (ref 0.0–0.2)

## 2023-10-18 LAB — MAGNESIUM: Magnesium: 1.6 mg/dL — ABNORMAL LOW (ref 1.7–2.4)

## 2023-10-18 LAB — SODIUM
Sodium: 118 mmol/L — CL (ref 135–145)
Sodium: 121 mmol/L — ABNORMAL LOW (ref 135–145)

## 2023-10-18 LAB — OSMOLALITY, URINE: Osmolality, Ur: 143 mosm/kg — ABNORMAL LOW (ref 300–900)

## 2023-10-18 LAB — HIV ANTIBODY (ROUTINE TESTING W REFLEX): HIV Screen 4th Generation wRfx: NONREACTIVE

## 2023-10-18 LAB — T4, FREE: Free T4: 1.32 ng/dL — ABNORMAL HIGH (ref 0.61–1.12)

## 2023-10-18 LAB — VITAMIN B12: Vitamin B-12: 436 pg/mL (ref 180–914)

## 2023-10-18 LAB — PHOSPHORUS: Phosphorus: 2.8 mg/dL (ref 2.5–4.6)

## 2023-10-18 LAB — CREATININE, URINE, RANDOM: Creatinine, Urine: 20 mg/dL

## 2023-10-18 LAB — OSMOLALITY: Osmolality: 260 mosm/kg — ABNORMAL LOW (ref 275–295)

## 2023-10-18 MED ORDER — ACETAMINOPHEN 650 MG RE SUPP: 650.0000 mg | Freq: Four times a day (QID) | RECTAL | Status: DC | PRN

## 2023-10-18 MED ORDER — ONDANSETRON HCL 4 MG PO TABS: 4.0000 mg | ORAL_TABLET | Freq: Four times a day (QID) | ORAL | Status: DC | PRN

## 2023-10-18 MED ORDER — VITAMIN D 25 MCG (1000 UNIT) PO TABS
1000.0000 [IU] | ORAL_TABLET | Freq: Every morning | ORAL | Status: DC
  Administered 2023-10-19 – 2023-10-21 (×3): 1000 [IU] via ORAL
  Filled 2023-10-18 (×3): qty 1

## 2023-10-18 MED ORDER — BIOTIN 10000 MCG PO TABS: 10.0000 mg | ORAL_TABLET | Freq: Every morning | ORAL | Status: DC

## 2023-10-18 MED ORDER — MENTHOL 3 MG MT LOZG
1.0000 | LOZENGE | OROMUCOSAL | Status: DC | PRN
  Administered 2023-10-19 – 2023-10-20 (×3): 3 mg via ORAL
  Filled 2023-10-18 (×3): qty 9

## 2023-10-18 MED ORDER — ENOXAPARIN SODIUM 40 MG/0.4ML IJ SOSY
40.0000 mg | PREFILLED_SYRINGE | INTRAMUSCULAR | Status: DC
  Administered 2023-10-19 – 2023-10-21 (×3): 40 mg via SUBCUTANEOUS
  Filled 2023-10-18 (×3): qty 0.4

## 2023-10-18 MED ORDER — IRBESARTAN 300 MG PO TABS
300.0000 mg | ORAL_TABLET | Freq: Every day | ORAL | Status: DC
  Administered 2023-10-20: 300 mg via ORAL
  Filled 2023-10-18 (×3): qty 1

## 2023-10-18 MED ORDER — ALLOPURINOL 300 MG PO TABS
300.0000 mg | ORAL_TABLET | Freq: Every morning | ORAL | Status: DC
  Administered 2023-10-19 – 2023-10-21 (×3): 300 mg via ORAL
  Filled 2023-10-18 (×3): qty 1

## 2023-10-18 MED ORDER — TAMOXIFEN CITRATE 10 MG PO TABS
10.0000 mg | ORAL_TABLET | Freq: Every evening | ORAL | Status: DC
  Administered 2023-10-19 – 2023-10-20 (×2): 10 mg via ORAL
  Filled 2023-10-18 (×3): qty 1

## 2023-10-18 MED ORDER — ROSUVASTATIN CALCIUM 20 MG PO TABS
20.0000 mg | ORAL_TABLET | Freq: Every day | ORAL | Status: DC
  Administered 2023-10-19 – 2023-10-21 (×3): 20 mg via ORAL
  Filled 2023-10-18 (×3): qty 1

## 2023-10-18 MED ORDER — LEVOTHYROXINE SODIUM 100 MCG PO TABS
100.0000 ug | ORAL_TABLET | Freq: Every day | ORAL | Status: DC
  Administered 2023-10-19 – 2023-10-21 (×3): 100 ug via ORAL
  Filled 2023-10-18 (×3): qty 1

## 2023-10-18 MED ORDER — MAGNESIUM SULFATE 2 GM/50ML IV SOLN
2.0000 g | Freq: Once | INTRAVENOUS | Status: AC
  Administered 2023-10-18: 2 g via INTRAVENOUS
  Filled 2023-10-18: qty 50

## 2023-10-18 MED ORDER — B COMPLEX-C PO TABS
1.0000 | ORAL_TABLET | Freq: Every day | ORAL | Status: DC
  Administered 2023-10-19 – 2023-10-21 (×3): 1 via ORAL
  Filled 2023-10-18 (×4): qty 1

## 2023-10-18 MED ORDER — CARVEDILOL 6.25 MG PO TABS
6.2500 mg | ORAL_TABLET | Freq: Two times a day (BID) | ORAL | Status: DC
  Administered 2023-10-18 – 2023-10-21 (×5): 6.25 mg via ORAL
  Filled 2023-10-18 (×6): qty 1

## 2023-10-18 MED ORDER — ONDANSETRON HCL 4 MG/2ML IJ SOLN: 4.0000 mg | Freq: Four times a day (QID) | INTRAMUSCULAR | Status: DC | PRN

## 2023-10-18 MED ORDER — SENNOSIDES-DOCUSATE SODIUM 8.6-50 MG PO TABS
1.0000 | ORAL_TABLET | Freq: Every evening | ORAL | Status: DC | PRN
Start: 2023-10-18 — End: 2023-10-21

## 2023-10-18 MED ORDER — ACETAMINOPHEN 325 MG PO TABS: 650.0000 mg | ORAL_TABLET | Freq: Four times a day (QID) | ORAL | Status: DC | PRN

## 2023-10-18 NOTE — ED Notes (Signed)
 Pt transported to 2W via stretcher by [employer]. Pt remained on cardiac monitor, accompanied by spouse. No acute distress noted.

## 2023-10-18 NOTE — ED Provider Notes (Signed)
 I saw and evaluated the patient, reviewed the resident's note and I agree with the findings and plan.  EKG Interpretation Date/Time:  Friday October 18 2023 15:29:56 EST Ventricular Rate:  57 PR Interval:  197 QRS Duration:  134 QT Interval:  502 QTC Calculation: 489 R Axis:   -2  Text Interpretation: Sinus rhythm IVCD, consider atypical LBBB Confirmed by Dasie Faden ([id number]) on 10/18/2023 4:16:41 PM   Patient presents due to low sodium.  Last reported sodium was 116.  She notes some weakness.  Plan will be to admit for hyponatremia.  Patient is EKG per interpretation shows normal sinus rhythm.   Dasie Faden, MD 10/18/23 [phone]

## 2023-10-18 NOTE — Consult Note (Signed)
 [facility] Renal Consultation Note  Requesting MD: Claretta Alderman, MD Indication for Consultation:  Hyponatremia   Chief complaint: sent with abnormal sodium   HPI:  Karen Mills is a 68 y.o. female with a history of hypertension, hypothyroidism, breast cancer, CKD, and gout who presented to the hospital at the direction of her outpatient nephrologist, Dr. Dennise, for hyponatremia.  She was started on chlorthalidone three weeks ago.  She was found to have her sodium that had dropped from 136 on 09/10/23 to 123 on 10/15/23.  She was told today to stop the chlorthalidone and then was sent to the ER per her most recent outpatient labs which per report indicated sodium of 117.  Nephrology is consulted for assistance with management of hyponatremia.  Note that she also does drink six 16.9 oz bottles of water a day.  She feels tired.  She was started on fluid restriction of 1.5 liters per day.  Lasix  is listed as a home med.  She has actually been on lasix  for a while, concurrently with the chlorthalidone.  Last dose of chlorthalidone was this morning before her appointment with Dr. Dennise.  She waits a moment to get her bearings after starting up but doesn't feel this is too out of the ordinary.  She feels alright other than some fatigue - she was surprised to find out about the labs being off and shocked when she found out that she needed to come to the hospital.   Creatinine  Date/Time Value Ref Range Status  10/15/2023 08:27 AM 1.12 (H) 0.44 - 1.00 mg/dL Final  87/96/7975 90:55 AM 1.28 (H) 0.44 - 1.00 mg/dL Final  88/94/7975 91:70 AM 1.13 (H) 0.44 - 1.00 mg/dL Final  89/91/7975 91:46 AM 1.34 (H) 0.44 - 1.00 mg/dL Final  90/88/7975 92:88 AM 0.98 0.44 - 1.00 mg/dL Final  91/79/7975 91:63 AM 1.08 (H) 0.44 - 1.00 mg/dL Final  92/69/7975 90:78 AM 1.07 (H) 0.44 - 1.00 mg/dL Final  92/90/7975 91:66 AM 1.22 (H) 0.44 - 1.00 mg/dL Final  93/80/7975 92:45 AM 1.19 (H) 0.44 - 1.00 mg/dL Final   94/71/7975 91:89 AM 1.10 (H) 0.44 - 1.00 mg/dL Final  94/92/7975 90:90 AM 1.07 (H) 0.44 - 1.00 mg/dL Final  95/83/7975 91:54 AM 1.10 (H) 0.44 - 1.00 mg/dL Final  96/93/7975 89:66 AM 1.42 (H) 0.44 - 1.00 mg/dL Final  97/98/7975 96:63 PM 1.10 (H) 0.44 - 1.00 mg/dL Final   Creatinine, Ser  Date/Time Value Ref Range Status  10/18/2023 02:30 PM 1.15 (H) 0.44 - 1.00 mg/dL Final  94/77/7975 89:58 AM 1.31 (H) 0.44 - 1.00 mg/dL Final  97/72/7976 90:40 AM 1.58 (H) 0.44 - 1.00 mg/dL Final  97/74/7976 96:60 AM 1.66 (H) 0.44 - 1.00 mg/dL Final  97/75/7976 92:47 AM 1.69 (H) 0.44 - 1.00 mg/dL Final  92/77/7979 98:69 PM 1.66 (H) 0.44 - 1.00 mg/dL Final  90/82/7990 90:84 AM 0.72  Final     PMHx:   Past Medical History:  Diagnosis Date   Breast cancer (HCC) 2020   Left Breast Cancer   CKD (chronic kidney disease)    Gout    Hyperlipidemia    Hypertension    Hypothyroidism    Megaloblastic anemia    Obesity    Personal history of radiation therapy 2020   Left Breast Cancer   PONV (postoperative nausea and vomiting)    SCC (squamous cell carcinoma) Keratoacanthoma 10/10/2016   Right Shin (Cx3,5FU)   Squamous cell carcinoma in situ (SCCIS) 11/15/2016  Right Lower Shin (tx p bx)   Varicose veins     Past Surgical History:  Procedure Laterality Date   ABLATION ON ENDOMETRIOSIS     BREAST LUMPECTOMY Left 04/30/2019   BREAST LUMPECTOMY WITH RADIOACTIVE SEED AND SENTINEL LYMPH NODE BIOPSY Left 04/30/2019   Procedure: LEFT BREAST LUMPECTOMY WITH RADIOACTIVE SEED AND SENTINEL LYMPH NODE BIOPSY;  Surgeon: Curvin Deward MOULD, MD;  Location: [facility];  Service: General;  Laterality: Left;   FOOT SURGERY     IR ANGIO EXTERNAL CAROTID SEL EXT CAROTID BILAT MOD SED  12/01/2021   IR ANGIO INTRA EXTRACRAN SEL COM CAROTID INNOMINATE BILAT MOD SED  12/01/2021   IR ANGIO VERTEBRAL SEL VERTEBRAL UNI R MOD SED  12/01/2021   IR ANGIOGRAM FOLLOW UP STUDY  12/01/2021   IR ANGIOGRAM FOLLOW UP STUDY   12/01/2021   IR ANGIOGRAM FOLLOW UP STUDY  12/01/2021   IR NEURO EACH ADD'L AFTER BASIC UNI LEFT (MS)  12/01/2021   IR NEURO EACH ADD'L AFTER BASIC UNI RIGHT (MS)  12/01/2021   IR NEURO EACH ADD'L AFTER BASIC UNI RIGHT (MS)  12/01/2021   IR TRANSCATH/EMBOLIZ  12/01/2021   IR TRANSCATH/EMBOLIZ  12/01/2021   RADIOLOGY WITH ANESTHESIA N/A 12/01/2021   Procedure: RADIOLOGY WITH ANESTHESIA;  Surgeon: Dolphus Carrion, MD;  Location: [facility];  Service: Radiology;  Laterality: N/A;   TONSILLECTOMY     vein removal      Family Hx:  Family History  Problem Relation Age of Onset   Heart disease Mother    Heart disease Father    Heart disease Brother    Pancreatic cancer Half-Brother        d. early 50s    Social History:  reports that she has never smoked. She has never used smokeless tobacco. She reports current alcohol use. She reports that she does not use drugs.  Allergies:  Allergies  Allergen Reactions   Accupril [Quinapril Hcl] Cough   Amlodipine Besylate Swelling    Leg swelling   Atacand [Candesartan] Other (See Comments)    Unknown reaction   Atorvastatin  Other (See Comments)    Joint pain   Cozaar [Losartan Potassium] Other (See Comments)    Stomach upset    Hydrochlorothiazide W-Triamterene Other (See Comments)    Unknown reaction  Other Reaction(s): not helpful   Tiazac [Diltiazem Hcl Er Beads] Other (See Comments)    Unknown reaction   Zestril [Lisinopril] Cough    Medications: Prior to Admission medications   Medication Sig Start Date End Date Taking? Authorizing Provider  allopurinol  (ZYLOPRIM ) 300 MG tablet Take 300 mg by mouth every morning. 10/24/21  Yes [provider]  B Complex-C (B-COMPLEX WITH VITAMIN C) tablet Take 1 tablet by mouth daily. 01/04/22  Yes [provider]  Biotin  10000 MCG TABS Take 10,000 mcg by mouth every morning.   Yes [provider]  carvedilol  (COREG ) 6.25 MG tablet Take 6.25 mg by mouth 2 (two) times daily.  10/24/21  Yes [provider]  cholecalciferol  (VITAMIN D3) 25 MCG (1000 UT) tablet Take 1,000 Units by mouth every morning.   Yes [provider]  furosemide  (LASIX ) 20 MG tablet Take 20 mg by mouth 2 (two) times daily.   Yes [provider]  Anselm Frizzle 1000 MG CAPS 1 capsule Orally once a day   Yes [provider]  levothyroxine  (SYNTHROID ) 100 MCG tablet Take 100 mcg by mouth every evening. 08/08/21  Yes [provider]  Multiple Vitamins-Minerals (HAIR FORMULA EXTRA STRENGTH PO) 4 tablets Orally once a day   Yes [provider]  rosuvastatin  (CRESTOR ) 20 MG tablet Take 1 tablet (20 mg total) by mouth daily. 11/05/22  Yes Thukkani, Arun K, MD  tamoxifen  (NOLVADEX ) 10 MG tablet TAKE ONE TABLET BY MOUTH DAILY Patient taking differently: Take 10 mg by mouth every evening. 03/11/23  Yes Gudena, Vinay, MD  valsartan  (DIOVAN ) 320 MG tablet Take 320 mg by mouth daily.   Yes [provider]  Vitamin E 670 MG (1000 UT) CAPS Take 1,000 Units by mouth every morning.   Yes [provider]    I have reviewed the patient's current and reported prior to admission medications.  Labs:     Latest Ref Rng & Units 10/18/2023    7:25 PM 10/18/2023    2:30 PM 10/15/2023    8:27 AM  BMP  Glucose 70 - 99 mg/dL  894  94   BUN 8 - 23 mg/dL  21  23   Creatinine 9.55 - 1.00 mg/dL  8.84  8.87   Sodium 864 - 145 mmol/L 118  119  123   Potassium 3.5 - 5.1 mmol/L  3.8  3.6   Chloride 98 - 111 mmol/L  84  91   CO2 22 - 32 mmol/L  23  23   Calcium  8.9 - 10.3 mg/dL  9.1  9.2     Urinalysis    Component Value Date/Time   COLORURINE STRAW (A) 10/18/2023 1524   APPEARANCEUR CLEAR 10/18/2023 1524   LABSPEC 1.003 (L) 10/18/2023 1524   PHURINE 7.0 10/18/2023 1524   GLUCOSEU NEGATIVE 10/18/2023 1524   HGBUR NEGATIVE 10/18/2023 1524   BILIRUBINUR NEGATIVE 10/18/2023 1524   KETONESUR NEGATIVE 10/18/2023 1524   PROTEINUR 30 (A) 10/18/2023 1524   NITRITE  NEGATIVE 10/18/2023 1524   LEUKOCYTESUR NEGATIVE 10/18/2023 1524     ROS:  Pertinent items noted in HPI and remainder of comprehensive ROS otherwise negative.  Physical Exam: Vitals:   10/18/23 1936 10/18/23 2042  BP: (!) 151/76 (!) 148/62  Pulse: 63 60  Resp: 15 17  Temp: 98.3 F (36.8 C) 97.7 F (36.5 C)  SpO2: 100% 97%     General: adult female in bed in NAD at rest  HEENT: NCAT Eyes: EOMI sclera anicteric Neck: supple trachea midline  Heart: S1S2 no rub Lungs: clear and unlabored; normal work of breathing at rest on room air  Abdomen: soft/nt/nd Extremities: no pitting edema; no cyanosis or clubbing Skin: no rash on extremities exposed  Neuro: alert and oriented x 3 provides hx and follows commands  Psych normal mood and affect  Assessment/Plan:  # Hyponatremia - Secondary to chlorthalidone.  TSH is acceptable  - We have stopped chlorthalidone - Getting sodium every 4 hours for now - agree - Once the chlorthalidone gets out of her system will assess for need for other measures such as a one-time-only low dose of tolvaptan - Pause lasix   - Please avoid thiazide diuretics such as hydrochlorothiazide or chlorthalidone  - Check cortisol - Discussed to limit fluid intake - see that 1.5 liters is currently set and this is reasonable to try given that she is coming off of two diuretics - Will check orthostatic vital signs to see if she needs a mini-bolus   # HTN  - Please avoid thiazide diuretics such as hydrochlorothiazide or chlorthalidone  - Would list hydrochlorothiazide as allergy  # CKD stage 3a - Mild  -  Note urine protein/cr ratio was checked today at 950 mg/g.  Unsure of prior labs for comparison  Thank you for the consult.  Please do not hesitate to contact me with any questions regarding our patient   Katheryn JAYSON Saba 10/18/2023, 11:11 PM

## 2023-10-18 NOTE — ED Provider Notes (Signed)
 [place] EMERGENCY DEPARTMENT AT [facility] Provider Note  HPI   Karen Mills is a 68 y.o. female patient with a PMHx of breast cancer status postlumpectomy, hypertension, hypothyroidism, who is here today with concern for hyponatremia.  Patient follows with nephrology for blood pressure, says that she had a sodium level checked a few days ago which was 123, she has never really had any sodium problems in the past, she had another one checked today and was 117 and was called and told to come to the hospital  Contrary to what the triage note says, she denies any altered mental status, any symptoms at all, may be some trace fatigue but adamantly denies seizures, altered mental status, syncope, chest pain shortness of breath abdominal pain back pain urinary symptoms  ROS Negative except as per HPI   Medical Decision Making   Upon presentation, the patient is afebrile hemodynamically stable blood pressure in the 140s, appears well nonfocal neuroexam moving all fours sensation intact in all 4 extremities, maybe some trace lower extremity edema, nonpitting, nonasymmetrical.  For this patient, we will check a repeat sodium level, glucose level checked was 107, she has a normal-appearing EKG which is sinus rhythm denies any peaked T waves or any major abnormality.  We will check some basic labs CBC CMP and I have ordered a large panel of laboratory studies, including serum and urine osmolality, protein creatinine magnesium  and thyroid  and a UA.  If the patient sodium is this low, we will have to be admitting her to the hospital for further workup, right now I will just fluid restrict her and do not think she needs any emergent treatment, however if she develops any new acute symptoms, I will give her a hypertonic bolus.  We will keep a close eye on this individual, and plan for admission to the hospital for hyponatremia of unknown origin at this point, I reviewed her medications, she does  take valsartan  which could potentially cause some hyponatremia no recent dose changes or any medication changes, she really denies any major changes in her life over the past couple of weeks.   Clinical Course as of 10/18/23 1731  Fri Oct 18, 2023  1518 Breast cancer s/p lumpectomy, htn, hypothyroidism [JL]    Clinical Course User Index [JL] Gaetano Pac, MD     Patient appears to have hypotonic hyponatremia with a measured serum osmolality of 260, urine shows no signs of infection, urine osmolality is 143, magnesium  is low, I will replete this.   Spoke to the hospitalist team, it does appear that she was started on chlorthalidone recently which is medication that she was referring to me earlier, she is going to be getting fluid restriction right now, and will require additional monitoring and workup in the medicine floor.  She will be admitted as  Again she has been asymptomatic, does not require any emergent hypertonic saline or anything like that, we will be admitted to the medicine service 1. Hyponatremia     @DISPOSITION @  Rx / DC Orders ED Discharge Orders     None        Past Medical History:  Diagnosis Date   Breast cancer (HCC) 2020   Left Breast Cancer   CKD (chronic kidney disease)    Gout    Hyperlipidemia    Hypertension    Hypothyroidism    Megaloblastic anemia    Obesity    Personal history of radiation therapy 2020   Left Breast  Cancer   PONV (postoperative nausea and vomiting)    SCC (squamous cell carcinoma) Keratoacanthoma 10/10/2016   Right Shin (Cx3,5FU)   Squamous cell carcinoma in situ (SCCIS) 11/15/2016   Right Lower Shin (tx p bx)   Varicose veins    Past Surgical History:  Procedure Laterality Date   ABLATION ON ENDOMETRIOSIS     BREAST LUMPECTOMY Left 04/30/2019   BREAST LUMPECTOMY WITH RADIOACTIVE SEED AND SENTINEL LYMPH NODE BIOPSY Left 04/30/2019   Procedure: LEFT BREAST LUMPECTOMY WITH RADIOACTIVE SEED AND SENTINEL LYMPH NODE  BIOPSY;  Surgeon: Curvin Deward MOULD, MD;  Location: [facility];  Service: General;  Laterality: Left;   FOOT SURGERY     IR ANGIO EXTERNAL CAROTID SEL EXT CAROTID BILAT MOD SED  12/01/2021   IR ANGIO INTRA EXTRACRAN SEL COM CAROTID INNOMINATE BILAT MOD SED  12/01/2021   IR ANGIO VERTEBRAL SEL VERTEBRAL UNI R MOD SED  12/01/2021   IR ANGIOGRAM FOLLOW UP STUDY  12/01/2021   IR ANGIOGRAM FOLLOW UP STUDY  12/01/2021   IR ANGIOGRAM FOLLOW UP STUDY  12/01/2021   IR NEURO EACH ADD'L AFTER BASIC UNI LEFT (MS)  12/01/2021   IR NEURO EACH ADD'L AFTER BASIC UNI RIGHT (MS)  12/01/2021   IR NEURO EACH ADD'L AFTER BASIC UNI RIGHT (MS)  12/01/2021   IR TRANSCATH/EMBOLIZ  12/01/2021   IR TRANSCATH/EMBOLIZ  12/01/2021   RADIOLOGY WITH ANESTHESIA N/A 12/01/2021   Procedure: RADIOLOGY WITH ANESTHESIA;  Surgeon: Dolphus Carrion, MD;  Location: [facility];  Service: Radiology;  Laterality: N/A;   TONSILLECTOMY     vein removal     Family History  Problem Relation Age of Onset   Heart disease Mother    Heart disease Father    Heart disease Brother    Pancreatic cancer Half-Brother        d. early 57s   Social History   Socioeconomic History   Marital status: Married    Spouse name: Not on file   Number of children: Not on file   Years of education: Not on file   Highest education level: Not on file  Occupational History   Occupation: retired  Tobacco Use   Smoking status: Never   Smokeless tobacco: Never  Vaping Use   Vaping status: Never Used  Substance and Sexual Activity   Alcohol use: Yes    Comment: 2-3x per week   Drug use: No   Sexual activity: Not on file  Other Topics Concern   Not on file  Social History Narrative   Not on file   Social Drivers of Health   Financial Resource Strain: Not on file  Food Insecurity: Not on file  Transportation Needs: No Transportation Needs (04/16/2019)   PRAPARE - Administrator, Civil Service (Medical): No    Lack of Transportation  (Non-Medical): No  Physical Activity: Not on file  Stress: Not on file  Social Connections: Not on file  Intimate Partner Violence: Not on file     Physical Exam   Vitals:   10/18/23 1413 10/18/23 1425 10/18/23 1500 10/18/23 1515  BP: (!) 184/70  (!) 149/66 (!) 146/80  Pulse: 65  64 61  Resp: 18  (!) 23 17  Temp: 97.6 F (36.4 C)     SpO2: 100%  100% 100%  Weight:  95.3 kg    Height:  5' 8 (1.727 m)      Physical Exam Vitals and nursing note reviewed.  Constitutional:  General: She is not in acute distress.    Appearance: She is well-developed.  HENT:     Head: Normocephalic and atraumatic.     Right Ear: External ear normal.     Left Ear: External ear normal.     Nose: Nose normal.     Mouth/Throat:     Mouth: Mucous membranes are moist.  Eyes:     Conjunctiva/sclera: Conjunctivae normal.  Cardiovascular:     Rate and Rhythm: Normal rate and regular rhythm.     Heart sounds: No murmur heard. Pulmonary:     Effort: Pulmonary effort is normal. No respiratory distress.     Breath sounds: Normal breath sounds.  Abdominal:     Palpations: Abdomen is soft.     Tenderness: There is no abdominal tenderness.  Musculoskeletal:        General: No swelling.     Cervical back: Neck supple.     Comments: Faint lower extremity edema  Skin:    General: Skin is warm and dry.     Capillary Refill: Capillary refill takes less than 2 seconds.  Neurological:     General: No focal deficit present.     Mental Status: She is alert and oriented to person, place, and time.  Psychiatric:        Mood and Affect: Mood normal.      Procedures   If procedures were preformed on this patient, they are listed below:  Procedures  The patient was seen, evaluated, and treated in conjunction with the attending physician, who voiced agreement in the care provided.  Note generated using Dragon voice dictation software and may contain dictation errors. Please contact me for any  clarification or with any questions.   Electronically signed by:  Fairy Kerby Revere, M.D. (PGY-2)    Revere Fairy, MD 10/18/23 1731    Dasie Faden, MD 10/19/23 1535

## 2023-10-18 NOTE — ED Notes (Signed)
 Critical result Na+ 119. EDP and primary RN notified.

## 2023-10-18 NOTE — ED Triage Notes (Signed)
 Patient sent in by nephrology for low sodium, Patient reports it is 117. Patient family reports confusion and fatigue. Patient is A&Ox4 at this time.

## 2023-10-18 NOTE — H&P (Addendum)
 History and Physical    Patient: Karen Mills FMW:[id number] DOB: May 22, 1956 DOA: 10/18/2023 DOS: the patient was seen and examined on 10/18/2023 PCP: Okey Carlin Redbird, MD  Patient coming from: Home  Chief Complaint:  Chief Complaint  Patient presents with   Altered Mental Status   HPI: Karen Mills is a 68 y.o. female with medical history significant of left breast cancer currently on tamoxifen , gout, HTN, HLD, hypothyroidism, varicose vein, proteinuria and CKD 3A who was advised by her nephrologist to present to the ED for evaluation due to sodium of 117.  Patient reports that she drinks significant amount of water daily (6 of the 16.9 fluid ounces bottles per day). She was started on chlorthalidone about 3 weeks ago by her nephrologist to help control her BP.  She was seen at her nephrologist office 3 days ago and found to have sodium 123.  Repeat labs today at their office showed sodium of 117 so they advised patient to present to the ED. She endorsed mild lightheadedness, mild fatigue and mild polyuria but denies any confusion, headache, nausea, vomiting, chest pain, shortness of breath, abdominal pain, weakness, numbness or tingling.  ED course: Initial vitals were temp 97.6, RR 23, HR 64, BP 149/66, SpO2 100% on room air Labs show sodium 119, K+ 3.8, chloride 84, glucose 105, creatinine 1.15, WBC 8.2, Hgb 10.8, platelet 301, TSH 2.18, free T4 1.32, serum osmolality 260, mag 1.6, Phos 2.8, urine osmolality 143, urine protein/creatinine 0.95 Patient was started on fluid restriction TRH was consulted for admission  Review of Systems: As mentioned in the history of present illness. All other systems reviewed and are negative. Past Medical History:  Diagnosis Date   Breast cancer (HCC) 2020   Left Breast Cancer   CKD (chronic kidney disease)    Gout    Hyperlipidemia    Hypertension    Hypothyroidism    Megaloblastic anemia    Obesity    Personal history of radiation  therapy 2020   Left Breast Cancer   PONV (postoperative nausea and vomiting)    SCC (squamous cell carcinoma) Keratoacanthoma 10/10/2016   Right Shin (Cx3,5FU)   Squamous cell carcinoma in situ (SCCIS) 11/15/2016   Right Lower Shin (tx p bx)   Varicose veins    Past Surgical History:  Procedure Laterality Date   ABLATION ON ENDOMETRIOSIS     BREAST LUMPECTOMY Left 04/30/2019   BREAST LUMPECTOMY WITH RADIOACTIVE SEED AND SENTINEL LYMPH NODE BIOPSY Left 04/30/2019   Procedure: LEFT BREAST LUMPECTOMY WITH RADIOACTIVE SEED AND SENTINEL LYMPH NODE BIOPSY;  Surgeon: Curvin Deward MOULD, MD;  Location: [facility];  Service: General;  Laterality: Left;   FOOT SURGERY     IR ANGIO EXTERNAL CAROTID SEL EXT CAROTID BILAT MOD SED  12/01/2021   IR ANGIO INTRA EXTRACRAN SEL COM CAROTID INNOMINATE BILAT MOD SED  12/01/2021   IR ANGIO VERTEBRAL SEL VERTEBRAL UNI R MOD SED  12/01/2021   IR ANGIOGRAM FOLLOW UP STUDY  12/01/2021   IR ANGIOGRAM FOLLOW UP STUDY  12/01/2021   IR ANGIOGRAM FOLLOW UP STUDY  12/01/2021   IR NEURO EACH ADD'L AFTER BASIC UNI LEFT (MS)  12/01/2021   IR NEURO EACH ADD'L AFTER BASIC UNI RIGHT (MS)  12/01/2021   IR NEURO EACH ADD'L AFTER BASIC UNI RIGHT (MS)  12/01/2021   IR TRANSCATH/EMBOLIZ  12/01/2021   IR TRANSCATH/EMBOLIZ  12/01/2021   RADIOLOGY WITH ANESTHESIA N/A 12/01/2021   Procedure: RADIOLOGY WITH ANESTHESIA;  Surgeon:  Dolphus Carrion, MD;  Location: [facility] OR;  Service: Radiology;  Laterality: N/A;   TONSILLECTOMY     vein removal     Social History:  reports that she has never smoked. She has never used smokeless tobacco. She reports current alcohol use. She reports that she does not use drugs.  Allergies  Allergen Reactions   Accupril [Quinapril Hcl] Cough   Amlodipine Besylate Swelling    Leg swelling   Atacand [Candesartan] Other (See Comments)    Unknown reaction   Atorvastatin  Other (See Comments)    Joint pain   Cozaar [Losartan Potassium] Other (See  Comments)    Stomach upset    Hydrochlorothiazide W-Triamterene Other (See Comments)    Unknown reaction  Other Reaction(s): not helpful   Tiazac [Diltiazem Hcl Er Beads] Other (See Comments)    Unknown reaction   Zestril [Lisinopril] Cough    Family History  Problem Relation Age of Onset   Heart disease Mother    Heart disease Father    Heart disease Brother    Pancreatic cancer Half-Brother        d. early 33s    Prior to Admission medications   Medication Sig Start Date End Date Taking? Authorizing Provider  allopurinol  (ZYLOPRIM ) 300 MG tablet Take 300 mg by mouth every morning. 10/24/21   [provider]  B Complex-C (B-COMPLEX WITH VITAMIN C) tablet Take 1 tablet by mouth daily. 01/04/22   [provider]  Biotin  10000 MCG TABS Take 10,000 mcg by mouth every morning.    [provider]  carvedilol  (COREG ) 6.25 MG tablet Take 6.25 mg by mouth 2 (two) times daily. 10/24/21   [provider]  cholecalciferol  (VITAMIN D3) 25 MCG (1000 UT) tablet Take 1,000 Units by mouth every morning.    [provider]  colchicine  0.6 MG tablet Take 1 tablet (0.6 mg total) by mouth 2 (two) times daily. 05/15/23   Magdalen Pasco RAMAN, DPM  Krill Oil 1000 MG CAPS 1 capsule Orally once a day    [provider]  levothyroxine  (SYNTHROID ) 100 MCG tablet Take 100 mcg by mouth daily before breakfast. 08/08/21   [provider]  Multiple Vitamins-Minerals (HAIR FORMULA EXTRA STRENGTH PO) 4 tablets Orally once a day    [provider]  Omega-3 Fatty Acids (FISH OIL PO) Take 800 mg by mouth every morning.    [provider]  rosuvastatin  (CRESTOR ) 20 MG tablet Take 1 tablet (20 mg total) by mouth daily. 11/05/22   Thukkani, Arun K, MD  tamoxifen  (NOLVADEX ) 10 MG tablet TAKE ONE TABLET BY MOUTH DAILY 03/11/23   Gudena, Vinay, MD  valsartan  (DIOVAN ) 40 MG tablet Take 1 tablet (40 mg total) by mouth 2 (two) times daily. 09/10/23   Gudena,  Vinay, MD  Vitamin E 670 MG (1000 UT) CAPS Take 1,000 Units by mouth every morning.    [provider]    Physical Exam: Vitals:   10/18/23 1413 10/18/23 1425 10/18/23 1500 10/18/23 1515  BP: (!) 184/70  (!) 149/66 (!) 146/80  Pulse: 65  64 61  Resp: 18  (!) 23 17  Temp: 97.6 F (36.4 C)     SpO2: 100%  100% 100%  Weight:  95.3 kg    Height:  5' 8 (1.727 m)     General: Pleasant, well-appearing elderly laying in bed. No acute distress. HEENT: [place]/AT. Anicteric sclera CV: RRR. No murmurs, rubs, or gallops. Trace ankle edema Pulmonary: Lungs CTAB. Normal effort.  No wheezing or rales. Abdominal: Soft, nontender, nondistended. Normal bowel sounds. Extremities: Palpable radial and DP pulses. Normal ROM. Skin: Warm and dry. No obvious rash or lesions. Neuro: A&Ox3. Moves all extremities. Normal sensation to light touch. No focal deficit. Psych: Normal mood and affect  Data Reviewed: Labs show sodium 119, K+ 3.8, chloride 84, glucose 105, creatinine 1.15, WBC 8.2, Hgb 10.8, platelet 301, TSH 2.18, free T41.32, serum osmolality 260, mag 1.6, Phos 2.8, urine osmolality 143, urine protein/creatinine 0.95 EKG shows sinus rhythm.  Assessment and Plan: BONITA BRINDISI is a 68 y.o. female with medical history significant of left breast cancer currently on tamoxifen , gout, HTN, HLD, hypothyroidism, varicose vein, proteinuria and CKD 3A who was advised by her nephrologist to present to the ED for evaluation due to sodium of 117 and admitted for hyponatremia.  # Hypotonic hyponatremia Patient with history of CKDA and uncontrolled hypertension recently started on chlorthalidone and found to have hyponatremia with sodium of 117 on outpatient labs by her nephrologist. Sodium of 136 5 weeks ago prior to initiating thiazide diuretic. Sodium 119 on admission. Labs showed low serum osmole, elevated urine osmole, normal TSH alk phos, low mag stable kidney function. Patient euvolemic on exam.  Presentation likely secondary to thiazide-induced hyponatremia. Chlorthalidone has been discontinued. She reports some lightheadedness and fatigue but at baseline mental status. Will admit for fluid restriction and close monitoring of sodium. Repeat sodium down to 118. Will consult nephrology to assist with management. -Admit to telemetry bed -Nephrology consulted, appreciate recs -Every 4 hours sodium checks -Fluid restriction of 1500 cc/24 hours -Follow-up urine sodium, potassium and creatinine -Close monitoring of mental status  # Hypomagnesemia Magnesium  of 1.6.  Received IV mag 2 g in the ED. -Follow-up morning mag  # CKD 3 A Creatinine of 1.15 seems to be around his baseline. -Trend renal function  # HTN BP significantly elevated with SBP in the 140s to 170s. -Continue Coreg  and valsartan   # Normocytic anemia Hemoglobin of 10.8 seems to be around baseline of 10-11. No evidence of active bleeding or bruising.  She does report some mild fatigue. -Check iron panel, ferritin and vitamin B12 -Trend CBC  # HLD -Continue rosuvastatin   # Hx breast cancer Stage Ia breast cancer of the left breast. Currently on tamoxifen  and follows closely with the cancer center. -Continue tamoxifen   # Gout -Continue allopurinol    Advance Care Planning:   Code Status: Full Code   Consults: None  Family Communication: Discussed admission with spouse at bedside  Severity of Illness: The appropriate patient status for this patient is OBSERVATION. Observation status is judged to be reasonable and necessary in order to provide the required intensity of service to ensure the patient's safety. The patient's presenting symptoms, physical exam findings, and initial radiographic and laboratory data in the context of their medical condition is felt to place them at decreased risk for further clinical deterioration. Furthermore, it is anticipated that the patient will be medically stable for discharge from  the hospital within 2 midnights of admission.   Author: Claretta CHRISTELLA Alderman, MD 10/18/2023 5:38 PM  For on call review [email or web address].

## 2023-10-18 NOTE — ED Notes (Signed)
 ED TO INPATIENT HANDOFF REPORT  ED Nurse Name and Phone #: Shelba [phone]  S Name/Age/Gender Karen Mills 68 y.o. female Room/Bed: 045C/045C  Code Status   Code Status: Full Code  Home/SNF/Other Home Patient oriented to: self, place, time, and situation Is this baseline? Yes   Triage Complete: Triage complete  Chief Complaint Hyponatremia [E87.1]  Triage Note Patient sent in by nephrology for low sodium, Patient reports it is 117. Patient family reports confusion and fatigue. Patient is A&Ox4 at this time.    Allergies Allergies  Allergen Reactions   Accupril [Quinapril Hcl] Cough   Amlodipine Besylate Swelling    Leg swelling   Atacand [Candesartan] Other (See Comments)    Unknown reaction   Atorvastatin  Other (See Comments)    Joint pain   Cozaar [Losartan Potassium] Other (See Comments)    Stomach upset    Hydrochlorothiazide W-Triamterene Other (See Comments)    Unknown reaction  Other Reaction(s): not helpful   Tiazac [Diltiazem Hcl Er Beads] Other (See Comments)    Unknown reaction   Zestril [Lisinopril] Cough    Level of Care/Admitting Diagnosis ED Disposition     ED Disposition  Admit   Condition  --   Comment  Hospital [facility] [[id number]]  Level of Care: Telemetry Medical [104]  May place patient in observation at [facility] or [facility] if equivalent level of care is available:: No  Covid Evaluation: Asymptomatic - no recent exposure (last 10 days) testing not required  Diagnosis: Hyponatremia [198519]  Admitting Physician: Karen Mills [[id number]]  Attending Physician: Karen Mills [[id number]]          B Medical/Surgery History Past Medical History:  Diagnosis Date   Breast cancer (HCC) 2020   Left Breast Cancer   CKD (chronic kidney disease)    Gout    Hyperlipidemia    Hypertension    Hypothyroidism    Megaloblastic anemia    Obesity    Personal history of radiation therapy 2020    Left Breast Cancer   PONV (postoperative nausea and vomiting)    SCC (squamous cell carcinoma) Keratoacanthoma 10/10/2016   Right Shin (Cx3,5FU)   Squamous cell carcinoma in situ (SCCIS) 11/15/2016   Right Lower Shin (tx p bx)   Varicose veins    Past Surgical History:  Procedure Laterality Date   ABLATION ON ENDOMETRIOSIS     BREAST LUMPECTOMY Left 04/30/2019   BREAST LUMPECTOMY WITH RADIOACTIVE SEED AND SENTINEL LYMPH NODE BIOPSY Left 04/30/2019   Procedure: LEFT BREAST LUMPECTOMY WITH RADIOACTIVE SEED AND SENTINEL LYMPH NODE BIOPSY;  Surgeon: Curvin Deward MOULD, MD;  Location: [facility];  Service: General;  Laterality: Left;   FOOT SURGERY     IR ANGIO EXTERNAL CAROTID SEL EXT CAROTID BILAT MOD SED  12/01/2021   IR ANGIO INTRA EXTRACRAN SEL COM CAROTID INNOMINATE BILAT MOD SED  12/01/2021   IR ANGIO VERTEBRAL SEL VERTEBRAL UNI R MOD SED  12/01/2021   IR ANGIOGRAM FOLLOW UP STUDY  12/01/2021   IR ANGIOGRAM FOLLOW UP STUDY  12/01/2021   IR ANGIOGRAM FOLLOW UP STUDY  12/01/2021   IR NEURO EACH ADD'L AFTER BASIC UNI LEFT (MS)  12/01/2021   IR NEURO EACH ADD'L AFTER BASIC UNI RIGHT (MS)  12/01/2021   IR NEURO EACH ADD'L AFTER BASIC UNI RIGHT (MS)  12/01/2021   IR TRANSCATH/EMBOLIZ  12/01/2021   IR TRANSCATH/EMBOLIZ  12/01/2021   RADIOLOGY WITH ANESTHESIA N/A 12/01/2021   Procedure:  RADIOLOGY WITH ANESTHESIA;  Surgeon: Dolphus Carrion, MD;  Location: [facility] OR;  Service: Radiology;  Laterality: N/A;   TONSILLECTOMY     vein removal       A IV Location/Drains/Wounds Patient Lines/Drains/Airways Status     Active Line/Drains/Airways     Name Placement date Placement time Site Days   Peripheral IV 10/18/23 22 G Posterior;Right Forearm 10/18/23  --  Forearm  less than 1            Intake/Output Last 24 hours No intake or output data in the 24 hours ending 10/18/23 1939  Labs/Imaging Results for orders placed or performed during the hospital encounter of 10/18/23 (from the  past 48 hours)  Comprehensive metabolic panel     Status: Abnormal   Collection Time: 10/18/23  2:30 PM  Result Value Ref Range   Sodium 119 (LL) 135 - 145 mmol/L    Comment: FIRST ATTEMPT @1613  10/18/23 E,[place] CRITICAL RESULT CALLED TO, READ BACK BY AND VERIFIED WITH C,CARTER RN @1621  10/18/23 E,[place]    Potassium 3.8 3.5 - 5.1 mmol/L   Chloride 84 (L) 98 - 111 mmol/L   CO2 23 22 - 32 mmol/L   Glucose, Bld 105 (H) 70 - 99 mg/dL    Comment: Glucose reference range applies only to samples taken after fasting for at least 8 hours.   BUN 21 8 - 23 mg/dL   Creatinine, Ser 8.84 (H) 0.44 - 1.00 mg/dL   Calcium  9.1 8.9 - 10.3 mg/dL   Total Protein 6.2 (L) 6.5 - 8.1 g/dL   Albumin 3.4 (L) 3.5 - 5.0 g/dL   AST 21 15 - 41 U/L   ALT 17 0 - 44 U/L   Alkaline Phosphatase 63 38 - 126 U/L   Total Bilirubin 1.0 0.0 - 1.2 mg/dL   GFR, Estimated 52 (L) >60 mL/min    Comment: (NOTE) Calculated using the CKD-EPI Creatinine Equation (2021)    Anion gap 12 5 - 15    Comment: Performed at [facility], [address], [place], [place] [address]  CBC     Status: Abnormal   Collection Time: 10/18/23  2:30 PM  Result Value Ref Range   WBC 8.2 4.0 - 10.5 K/uL   RBC 3.14 (L) 3.87 - 5.11 MIL/uL   Hemoglobin 10.8 (L) 12.0 - 15.0 g/dL   HCT 70.4 (L) 63.9 - 53.9 %   MCV 93.9 80.0 - 100.0 fL   MCH 34.4 (H) 26.0 - 34.0 pg   MCHC 36.6 (H) 30.0 - 36.0 g/dL   RDW 87.5 88.4 - 84.4 %   Platelets 301 150 - 400 K/uL   nRBC 0.0 0.0 - 0.2 %    Comment: Performed at [facility], [address], [place], [place] [address]  TSH     Status: None   Collection Time: 10/18/23  2:30 PM  Result Value Ref Range   TSH 2.177 0.350 - 4.500 uIU/mL    Comment: Performed by a 3rd Generation assay with a functional sensitivity of <=0.01 uIU/mL. Performed at [facility], [address], [place], [place] [address]   T4, free     Status: Abnormal   Collection Time: 10/18/23  2:30 PM  Result Value Ref Range    Free T4 1.32 (H) 0.61 - 1.12 ng/dL    Comment: (NOTE) Biotin  ingestion may interfere with free T4 tests. If the results are inconsistent with the TSH level, previous test results, or the  clinical presentation, then consider biotin  interference. If needed, order repeat testing after stopping biotin . Performed at [facility], [address] [place], [place] [address]   Osmolality     Status: Abnormal   Collection Time: 10/18/23  2:30 PM  Result Value Ref Range   Osmolality 260 (L) 275 - 295 mOsm/kg    Comment: REPEATED TO VERIFY Performed at [facility], [address], [place], [place] [address]   Magnesium      Status: Abnormal   Collection Time: 10/18/23  2:30 PM  Result Value Ref Range   Magnesium  1.6 (L) 1.7 - 2.4 mg/dL    Comment: Performed at [facility], [address], [place], [place] [address]  Phosphorus     Status: None   Collection Time: 10/18/23  2:30 PM  Result Value Ref Range   Phosphorus 2.8 2.5 - 4.6 mg/dL    Comment: Performed at [facility], [address] [place], [place] [address]  CBG monitoring, ED     Status: Abnormal   Collection Time: 10/18/23  2:49 PM  Result Value Ref Range   Glucose-Capillary 107 (H) 70 - 99 mg/dL    Comment: Glucose reference range applies only to samples taken after fasting for at least 8 hours.   Comment 1 Notify RN    Comment 2 Document in Chart   Urinalysis, w/ Reflex to Culture (Infection Suspected) -Urine, Clean Catch     Status: Abnormal   Collection Time: 10/18/23  3:24 PM  Result Value Ref Range   Specimen Source URINE, CLEAN CATCH    Color, Urine STRAW (A) YELLOW   APPearance CLEAR CLEAR   Specific Gravity, Urine 1.003 (L) 1.005 - 1.030   pH 7.0 5.0 - 8.0   Glucose, UA NEGATIVE NEGATIVE mg/dL   Hgb urine dipstick NEGATIVE NEGATIVE   Bilirubin Urine NEGATIVE NEGATIVE   Ketones, ur NEGATIVE NEGATIVE mg/dL   Protein, ur 30 (A) NEGATIVE mg/dL   Nitrite NEGATIVE NEGATIVE   Leukocytes,Ua  NEGATIVE NEGATIVE   RBC / HPF 0-5 0 - 5 RBC/hpf   WBC, UA 0-5 0 - 5 WBC/hpf    Comment:        Reflex urine culture not performed if WBC <=10, OR if Squamous epithelial cells >5. If Squamous epithelial cells >5 suggest recollection.    Bacteria, UA NONE SEEN NONE SEEN   Squamous Epithelial / HPF 0-5 0 - 5 /HPF    Comment: Performed at [facility], [address] [place], [place] [address]  Osmolality, urine     Status: Abnormal   Collection Time: 10/18/23  3:24 PM  Result Value Ref Range   Osmolality, Ur 143 (L) 300 - 900 mOsm/kg    Comment: REPEATED TO VERIFY Performed at [facility], [address], [place], [place] [address]   Protein / creatinine ratio, urine     Status: Abnormal   Collection Time: 10/18/23  3:24 PM  Result Value Ref Range   Creatinine, Urine 20 mg/dL   Total Protein, Urine 19 mg/dL    Comment: NO NORMAL RANGE ESTABLISHED FOR THIS TEST   Protein Creatinine Ratio 0.95 (H) 0.00 - 0.15 mg/mg[Cre]    Comment: Performed at [facility] Lab, [address], [place], [place] [address]   No results found.  Pending Labs Unresulted Labs (From admission, onward)     Start     Ordered   10/19/23 0500  Basic metabolic panel  Tomorrow morning,   R        10/18/23 1930   10/19/23 0500  Magnesium   Tomorrow morning,   R        10/18/23 1930   10/18/23 1933  Vitamin B12  Once,   R        10/18/23 1932   10/18/23 1933  Iron and TIBC  Once,   R        10/18/23 1932   10/18/23 1933  Ferritin  Once,   R        10/18/23 1932   10/18/23 1836  HIV Antibody (routine testing w rflx)  (HIV Antibody (Routine testing w reflex) panel)  Once,   R        10/18/23 1838   10/18/23 1834  Sodium  Now then every 4 hours,   R (with STAT occurrences)      10/18/23 1835   10/18/23 1524  Na and K (sodium & potassium), rand urine  Once,   URGENT        10/18/23 1523   10/18/23 1524  Creatinine, urine, random  Once,   URGENT        10/18/23 1523             Vitals/Pain Today's Vitals   10/18/23 1630 10/18/23 1700 10/18/23 1730 10/18/23 1936  BP: (!) 157/67 (!) 172/75 (!) 160/73 (!) 151/76  Pulse: (!) 59 (!) 59 (!) 56 63  Resp: 12 11 12 15   Temp:    98.3 F (36.8 C)  TempSrc:    Oral  SpO2: 100% 100% 100% 100%  Weight:      Height:      PainSc:    0-No pain    Isolation Precautions No active isolations  Medications Medications  enoxaparin  (LOVENOX ) injection 40 mg (has no administration in time range)  acetaminophen  (TYLENOL ) tablet 650 mg (has no administration in time range)    Or  acetaminophen  (TYLENOL ) suppository 650 mg (has no administration in time range)  senna-docusate (Senokot-S) tablet 1 tablet (has no administration in time range)  ondansetron  (ZOFRAN ) tablet 4 mg (has no administration in time range)    Or  ondansetron  (ZOFRAN ) injection 4 mg (has no administration in time range)  allopurinol  (ZYLOPRIM ) tablet 300 mg (has no administration in time range)  B-complex with vitamin C tablet 1 tablet (has no administration in time range)  carvedilol  (COREG ) tablet 6.25 mg (has no administration in time range)  cholecalciferol  (VITAMIN D3) 25 MCG (1000 UNIT) tablet 1,000 Units (has no administration in time range)  levothyroxine  (SYNTHROID ) tablet 100 mcg (has no administration in time range)  rosuvastatin  (CRESTOR ) tablet 20 mg (has no administration in time range)  tamoxifen  (NOLVADEX ) tablet 10 mg (has no administration in time range)  irbesartan  (AVAPRO ) tablet 300 mg (has no administration in time range)  menthol -cetylpyridinium (CEPACOL) lozenge 3 mg (has no administration in time range)  magnesium  sulfate IVPB 2 g 50 mL (2 g Intravenous New Bag/Given 10/18/23 1801)    Mobility walks     Focused Assessments Neuro Assessment Handoff:  Swallow screen pass? Yes          Neuro Assessment: Within Defined Limits Neuro Checks:      Has TPA been given? No If patient is a Neuro Trauma and patient is going  to OR before floor call report to 4N Charge nurse: [phone] or [phone]   R Recommendations: See Admitting Provider Note  Report given to:   Additional  Notes:  Spouse with pt.

## 2023-10-19 DIAGNOSIS — N1831 Chronic kidney disease, stage 3a: Secondary | ICD-10-CM | POA: Diagnosis present

## 2023-10-19 DIAGNOSIS — T502X5A Adverse effect of carbonic-anhydrase inhibitors, benzothiadiazides and other diuretics, initial encounter: Secondary | ICD-10-CM | POA: Diagnosis present

## 2023-10-19 DIAGNOSIS — Z853 Personal history of malignant neoplasm of breast: Secondary | ICD-10-CM | POA: Diagnosis not present

## 2023-10-19 DIAGNOSIS — Z79899 Other long term (current) drug therapy: Secondary | ICD-10-CM | POA: Diagnosis not present

## 2023-10-19 DIAGNOSIS — E871 Hypo-osmolality and hyponatremia: Secondary | ICD-10-CM | POA: Diagnosis present

## 2023-10-19 DIAGNOSIS — Z8249 Family history of ischemic heart disease and other diseases of the circulatory system: Secondary | ICD-10-CM | POA: Diagnosis not present

## 2023-10-19 DIAGNOSIS — Z888 Allergy status to other drugs, medicaments and biological substances status: Secondary | ICD-10-CM | POA: Diagnosis not present

## 2023-10-19 DIAGNOSIS — E1122 Type 2 diabetes mellitus with diabetic chronic kidney disease: Secondary | ICD-10-CM | POA: Diagnosis present

## 2023-10-19 DIAGNOSIS — M109 Gout, unspecified: Secondary | ICD-10-CM | POA: Diagnosis present

## 2023-10-19 DIAGNOSIS — Z800 Family history of malignant neoplasm of digestive organs: Secondary | ICD-10-CM | POA: Diagnosis not present

## 2023-10-19 DIAGNOSIS — Z923 Personal history of irradiation: Secondary | ICD-10-CM | POA: Diagnosis not present

## 2023-10-19 DIAGNOSIS — E878 Other disorders of electrolyte and fluid balance, not elsewhere classified: Secondary | ICD-10-CM | POA: Diagnosis present

## 2023-10-19 DIAGNOSIS — Z85828 Personal history of other malignant neoplasm of skin: Secondary | ICD-10-CM | POA: Diagnosis not present

## 2023-10-19 DIAGNOSIS — Z79810 Long term (current) use of selective estrogen receptor modulators (SERMs): Secondary | ICD-10-CM | POA: Diagnosis not present

## 2023-10-19 DIAGNOSIS — I959 Hypotension, unspecified: Secondary | ICD-10-CM | POA: Diagnosis present

## 2023-10-19 DIAGNOSIS — Z6831 Body mass index (BMI) 31.0-31.9, adult: Secondary | ICD-10-CM | POA: Diagnosis not present

## 2023-10-19 DIAGNOSIS — E669 Obesity, unspecified: Secondary | ICD-10-CM | POA: Diagnosis present

## 2023-10-19 DIAGNOSIS — E039 Hypothyroidism, unspecified: Secondary | ICD-10-CM | POA: Diagnosis present

## 2023-10-19 DIAGNOSIS — Z79890 Hormone replacement therapy (postmenopausal): Secondary | ICD-10-CM | POA: Diagnosis not present

## 2023-10-19 DIAGNOSIS — I129 Hypertensive chronic kidney disease with stage 1 through stage 4 chronic kidney disease, or unspecified chronic kidney disease: Secondary | ICD-10-CM | POA: Diagnosis present

## 2023-10-19 DIAGNOSIS — E876 Hypokalemia: Secondary | ICD-10-CM | POA: Diagnosis present

## 2023-10-19 DIAGNOSIS — E785 Hyperlipidemia, unspecified: Secondary | ICD-10-CM | POA: Diagnosis present

## 2023-10-19 LAB — BASIC METABOLIC PANEL
Anion gap: 7 (ref 5–15)
Anion gap: 9 (ref 5–15)
BUN: 17 mg/dL (ref 8–23)
BUN: 20 mg/dL (ref 8–23)
CO2: 26 mmol/L (ref 22–32)
CO2: 24 mmol/L (ref 22–32)
Calcium: 8.7 mg/dL — ABNORMAL LOW (ref 8.9–10.3)
Calcium: 8.9 mg/dL (ref 8.9–10.3)
Chloride: 88 mmol/L — ABNORMAL LOW (ref 98–111)
Chloride: 92 mmol/L — ABNORMAL LOW (ref 98–111)
Creatinine, Ser: 1.03 mg/dL — ABNORMAL HIGH (ref 0.44–1.00)
Creatinine, Ser: 1.38 mg/dL — ABNORMAL HIGH (ref 0.44–1.00)
GFR, Estimated: 59 mL/min — ABNORMAL LOW (ref >60)
GFR, Estimated: 42 mL/min — ABNORMAL LOW (ref >60)
Glucose, Bld: 110 mg/dL — ABNORMAL HIGH (ref 70–99)
Glucose, Bld: 112 mg/dL — ABNORMAL HIGH (ref 70–99)
Potassium: 3.1 mmol/L — ABNORMAL LOW (ref 3.5–5.1)
Potassium: 4.1 mmol/L (ref 3.5–5.1)
Sodium: 121 mmol/L — ABNORMAL LOW (ref 135–145)
Sodium: 125 mmol/L — ABNORMAL LOW (ref 135–145)

## 2023-10-19 LAB — CBC
HCT: 29.3 % — ABNORMAL LOW (ref 36.0–46.0)
Hemoglobin: 10.6 g/dL — ABNORMAL LOW (ref 12.0–15.0)
MCH: 34 pg (ref 26.0–34.0)
MCHC: 36.2 g/dL — ABNORMAL HIGH (ref 30.0–36.0)
MCV: 93.9 fL (ref 80.0–100.0)
Platelets: 281 10*3/uL (ref 150–400)
RBC: 3.12 MIL/uL — ABNORMAL LOW (ref 3.87–5.11)
RDW: 12.2 % (ref 11.5–15.5)
WBC: 8.4 10*3/uL (ref 4.0–10.5)
nRBC: 0 % (ref 0.0–0.2)

## 2023-10-19 LAB — CORTISOL-AM, BLOOD: Cortisol - AM: 10.5 ug/dL (ref 6.7–22.6)

## 2023-10-19 LAB — MAGNESIUM: Magnesium: 2 mg/dL (ref 1.7–2.4)

## 2023-10-19 LAB — SODIUM
Sodium: 122 mmol/L — ABNORMAL LOW (ref 135–145)
Sodium: 128 mmol/L — ABNORMAL LOW (ref 135–145)

## 2023-10-19 MED ORDER — SODIUM CHLORIDE 1 G PO TABS
1.0000 g | ORAL_TABLET | Freq: Three times a day (TID) | ORAL | Status: DC
  Administered 2023-10-19: 1 g via ORAL
  Filled 2023-10-19: qty 1

## 2023-10-19 MED ORDER — POTASSIUM CHLORIDE CRYS ER 20 MEQ PO TBCR
40.0000 meq | EXTENDED_RELEASE_TABLET | Freq: Two times a day (BID) | ORAL | Status: AC
  Administered 2023-10-19 (×2): 40 meq via ORAL
  Filled 2023-10-19 (×2): qty 2

## 2023-10-19 MED ORDER — SODIUM CHLORIDE 0.9 % IV SOLN: INTRAVENOUS | Status: DC

## 2023-10-19 NOTE — Plan of Care (Signed)

## 2023-10-19 NOTE — Progress Notes (Addendum)
 Progress Note   Patient: Karen Mills FMW:[id number] DOB: Jul 15, 1956 DOA: 10/18/2023     0 DOS: the patient was seen and examined on 10/19/2023   Brief hospital course: HPI: Karen Mills is a 68 y.o. female with medical history significant of left breast cancer currently on tamoxifen , gout, HTN, HLD, hypothyroidism, varicose vein, proteinuria and CKD 3A who was advised by her nephrologist to present to the ED for evaluation due to sodium of 117.  Patient reports that she drinks significant amount of water daily (6 of the 16.9 fluid ounces bottles per day). She was started on chlorthalidone about 3 weeks ago by her nephrologist to help control her BP.  She was seen at her nephrologist office 3 days ago and found to have sodium 123.  Repeat labs today at their office showed sodium of 117 so they advised patient to present to the ED. She endorsed mild lightheadedness, mild fatigue and mild polyuria but denies any confusion, headache, nausea, vomiting, chest pain, shortness of breath, abdominal pain, weakness, numbness or tingling.   ED course: Initial vitals were temp 97.6, RR 23, HR 64, BP 149/66, SpO2 100% on room air Labs show sodium 119, K+ 3.8, chloride 84, glucose 105, creatinine 1.15, WBC 8.2, Hgb 10.8, platelet 301, TSH 2.18, free T4 1.32, serum osmolality 260, mag 1.6, Phos 2.8, urine osmolality 143, urine protein/creatinine 0.95 Patient was started on fluid restriction [facility] was consulted for admission  Assessment and Plan: Hypotonic hyponatremia associated mild hypochloremia Sodium of 136 five weeks ago prior to initiating thiazide diuretic. Sodium 119 on admission. As of 1/11 sodium is in the low 120s will continue to check every 4 hours Low serum osmole, elevated urine osmole, normal TSH alk phos, low mag stable kidney function. Patient euvolemic on exam. Etiology likely secondary to thiazide-induced hyponatremia. Chlorthalidone has been discontinued.  Symptomatic: reported  lightheadedness and fatigue (both of which have improved since admission) but at baseline mental status.  Continue fluid restriction 1500 cc in 24 hours and add sodium chloride  1 g 3 times daily with meals.  Nephrology also considering a one-time dose of tolvaptan Nephrology consulted and recommends avoid thiazide diuretics in the future and recommends listing hydrochlorothiazide as an allergy -Every 4 hours sodium checks -Fluid restriction of 1500 cc/24 hours -Follow-up urine sodium, potassium and creatinine -Close monitoring of mental status   Hypomagnesemia Magnesium  of 1.6.  Received IV mag 2 g in the ED. Repeat magnesium  level 2.0  Hypokalemia Potassium down to 3.1 today so we will replace orally   CKD IIIa Creatinine of 1.15 seems to be around his baseline.   HTN BP elevated with SBP in the 140s to 170s. Continue Coreg  and valsartan  No further thiazide diuretics per nephrology   Normocytic anemia Hemoglobin of 10.8 seems to be around baseline of 10-11.  Anemia panel within normal limits   HLD Continue rosuvastatin    Hx breast cancer Stage Ia breast cancer of the left breast. Currently on tamoxifen  and follows closely with the cancer center. Continue tamoxifen    Gout Continue allopurinol        Subjective: Patient reports previous fatigue and other symptoms have resolved.  She is eager to discharge home but understands rationale as to why she needs to remain in the hospital at least another 24 hours  Physical Exam: Vitals:   10/19/23 0003 10/19/23 0006 10/19/23 0024 10/19/23 0432  BP: (!) 117/51 96/74 (!) 94/56 130/64  Pulse: 63 65 66 68  Resp: 17 18 17  18  Temp: 97.6 F (36.4 C) 98 F (36.7 C) 98.2 F (36.8 C) (!) 97.4 F (36.3 C)  TempSrc: Oral Oral Oral   SpO2: 96% 98% 99% 95%  Weight:      Height:       Constitutional: NAD, calm, comfortable Respiratory: clear to auscultation bilaterally, no wheezing, no crackles. Normal respiratory effort. No  accessory muscle use.  Cardiovascular: Regular rate and rhythm, no murmurs / rubs / gallops. No extremity edema. 2+ pedal pulses  Abdomen: no tenderness, no masses palpated. No hepatosplenomegaly. Bowel sounds positive.  Musculoskeletal: no clubbing / cyanosis. No joint deformity upper and lower extremities. Good ROM, no contractures. Normal muscle tone.  Neurologic: CN 2-12 grossly intact. Sensation intact, . Strength 5/5 x all 4 extremities.  Psychiatric: Normal judgment and insight. Alert and oriented x 3. Normal mood.   Data Reviewed:  Sodium 121, potassium 3.1, chloride 88, BUN 17, creatinine 1.03, magnesium  2.0  Iron 63, 8 UIBC 260, TIBC 323, sat 20, ferritin 223, vitamin B12 436  White count 8400, hemoglobin 10.6, hematocrit 29.3, platelets 281,000  Family Communication: Patient only  Disposition: Status is: Observation The patient remains OBS appropriate and will d/c before 2 midnights.  Planned Discharge Destination: Home    Time spent: 40 minutes   Author: Isaiah Lever, NP 10/19/2023 8:33 AM  For on call review [email or web address].

## 2023-10-19 NOTE — Progress Notes (Signed)
 [facility] Progress Note  Name: Karen Mills MRN: [id number] DOB: 1955/10/17  Chief Complaint:  Abnormal sodium outpatient   Subjective:  Strict ins/outs are not available.  She had 4 unmeasured urine voids over 1/10.  A little hypotension with standing overnight.  She feels ok.  Got to see them plow the parking lot   Review of systems:  Denies shortness of breath or chest pain Denies n/v ---------- Background on consult:  Karen Mills is a 68 y.o. female with a history of hypertension, hypothyroidism, breast cancer, CKD, and gout who presented to the hospital at the direction of her outpatient nephrologist, Dr. Dennise, for hyponatremia.  She was started on chlorthalidone three weeks ago.  She was found to have her sodium that had dropped from 136 on 09/10/23 to 123 on 10/15/23.  She was told today to stop the chlorthalidone and then was sent to the ER per her most recent outpatient labs which per report indicated sodium of 117.  Nephrology is consulted for assistance with management of hyponatremia.  Note that she also does drink six 16.9 oz bottles of water a day.  She feels tired.  She was started on fluid restriction of 1.5 liters per day.  Lasix  is listed as a home med.  She has actually been on lasix  for a while, concurrently with the chlorthalidone.  Last dose of chlorthalidone was this morning before her appointment with Dr. Dennise.  She waits a moment to get her bearings after starting up but doesn't feel this is too out of the ordinary.  She feels alright other than some fatigue - she was surprised to find out about the labs being off and shocked when she found out that she needed to come to the hospital.      Intake/Output Summary (Last 24 hours) at 10/19/2023 1111 Last data filed at 10/19/2023 0958 Gross per 24 hour  Intake 851 ml  Output --  Net 851 ml    Vitals:  Vitals:   10/19/23 0024 10/19/23 0432 10/19/23 0858 10/19/23 0933  BP: (!) 94/56 130/64 (!)  136/53 (!) 128/57  Pulse: 66 68 66 67  Resp: 17 18 18    Temp: 98.2 F (36.8 C) (!) 97.4 F (36.3 C) 98.4 F (36.9 C)   TempSrc: Oral     SpO2: 99% 95% 98%   Weight:      Height:         Physical Exam:  General: adult female in bed in NAD at rest  HEENT: NCAT Eyes: EOMI sclera anicteric Neck: supple trachea midline  Heart: S1S2 no rub Lungs: clear and unlabored; normal work of breathing at rest on room air  Abdomen: soft/nt/nd Extremities: no pitting edema; no cyanosis or clubbing Skin: no rash on extremities exposed  Neuro: alert and oriented x 3 provides hx and follows commands  Psych normal mood and affect  Medications reviewed   Labs:     Latest Ref Rng & Units 10/19/2023    6:49 AM 10/19/2023    3:45 AM 10/18/2023   10:22 PM  BMP  Glucose 70 - 99 mg/dL 889     BUN 8 - 23 mg/dL 17     Creatinine 9.55 - 1.00 mg/dL 8.96     Sodium 864 - 854 mmol/L 121  122  121   Potassium 3.5 - 5.1 mmol/L 3.1     Chloride 98 - 111 mmol/L 88     CO2 22 - 32 mmol/L 26  Calcium  8.9 - 10.3 mg/dL 8.7        Assessment/Plan:   # Hyponatremia - Secondary to chlorthalidone.  TSH is acceptable.  AM cortisol is acceptable - We have stopped chlorthalidone indefinitely and paused lasix   - We are letting chlorthalidone get out of her system then will assess for need for other measures such as a one-time-only low dose of tolvaptan.  Given that she has been on two diuretics and orthostatics are positive I am giving NS at 75 ml/hr x 12 hours prior to likely doing tolvaptan in the next 24 hours - Discontinued fluid restriction for now given orthostatic  - Please avoid thiazide diuretics such as hydrochlorothiazide or chlorthalidone  - See that team has added salt tabs   # HTN  - Please avoid thiazide diuretics such as hydrochlorothiazide or chlorthalidone  - I have listed chlorthalidone as allergy causing severe hyponatremia   # CKD stage 3a - Mild  - Note urine protein/cr ratio was  checked today at 950 mg/g.  Unsure of prior labs for comparison  Disposition - continue inpatient monitoring   Katheryn JAYSON Saba, MD 10/19/2023 11:11 AM

## 2023-10-20 DIAGNOSIS — E871 Hypo-osmolality and hyponatremia: Secondary | ICD-10-CM | POA: Diagnosis not present

## 2023-10-20 LAB — BASIC METABOLIC PANEL
Anion gap: 6 (ref 5–15)
BUN: 19 mg/dL (ref 8–23)
CO2: 25 mmol/L (ref 22–32)
Calcium: 9 mg/dL (ref 8.9–10.3)
Chloride: 97 mmol/L — ABNORMAL LOW (ref 98–111)
Creatinine, Ser: 1.26 mg/dL — ABNORMAL HIGH (ref 0.44–1.00)
GFR, Estimated: 47 mL/min — ABNORMAL LOW (ref >60)
Glucose, Bld: 117 mg/dL — ABNORMAL HIGH (ref 70–99)
Potassium: 3.8 mmol/L (ref 3.5–5.1)
Sodium: 128 mmol/L — ABNORMAL LOW (ref 135–145)

## 2023-10-20 LAB — SODIUM: Sodium: 129 mmol/L — ABNORMAL LOW (ref 135–145)

## 2023-10-20 NOTE — Progress Notes (Signed)
 PROGRESS NOTE  Karen Mills  FMW:[id number] DOB: 05-20-1956 DOA: 10/18/2023 PCP: Okey Carlin Redbird, MD  Consultants  Brief Narrative: 68 y.o. female with medical history significant of left breast cancer currently on tamoxifen , gout, HTN, HLD, hypothyroidism, varicose vein, proteinuria and CKD 3A who was advised by her nephrologist to present to the ED for evaluation due to sodium of 117.  Being followed by nephrology sodium uptrending well.   Assessment & Plan: Hypotonic hyponatremia associated mild hypochloremia Etiology likely secondary to thiazide-induced hyponatremia. Chlorthalidone has been discontinued.  Symptomatic: reported lightheadedness and fatigue (both of which have improved since admission) but at baseline mental status.  Continue fluid restriction 1500 cc in 24 hours  Nephrology consulted and recommends avoid thiazide diuretics in the future and recommends listing hydrochlorothiazide as an allergy -Every 4 hours sodium checks -Fluid restriction of 1500 cc/24 hours -Follow-up urine sodium, potassium and creatinine -Close monitoring of mental status.  Rate of rise acceptable rechecking sodium level this afternoon.   Hypomagnesemia Magnesium  of 1.6.  Received IV mag 2 g in the ED. Repeat magnesium  level 2.0   Hypokalemia Resolved   CKD IIIa Creatinine of 1.15 seems to be around his baseline.   HTN BP elevated with SBP in the 140s to 170s. Continue Coreg  and valsartan  No further thiazide diuretics per nephrology   Normocytic anemia Hemoglobin of 10.8 seems to be around baseline of 10-11.  Anemia panel within normal limits Likely secondary to   HLD Continue rosuvastatin    Hx breast cancer Stage Ia breast cancer of the left breast. Currently on tamoxifen  and follows closely with the cancer center. Continue tamoxifen    Gout Continue allopurinol        DVT prophylaxis:  enoxaparin  (LOVENOX ) injection 40 mg Start: 10/19/23 1000  Code Status:   Code  Status: Full Code Level of care: Telemetry Medical Status is: Inpatient Remains inpatient appropriate because: Telemetry plus ongoing monitoring of sodium   Consults called: Nephrology  Subjective: Patient continues to feel better than she has.  Still very mild lightheadedness when she stands does well walking around the room.  No headaches.  No vision changes.  No lower extremity swelling.  Objective: Vitals:   10/19/23 2011 10/19/23 2358 10/20/23 0421 10/20/23 0840  BP: (!) 141/55 (!) 140/60 (!) 153/59 (!) 120/55  Pulse: 69 61 62 64  Resp: 18 18 18 18   Temp: 98.2 F (36.8 C) 97.9 F (36.6 C) 98 F (36.7 C) 98 F (36.7 C)  TempSrc: Oral Oral Oral Oral  SpO2: 100% 99% 99% 99%  Weight:      Height:        Intake/Output Summary (Last 24 hours) at 10/20/2023 1445 Last data filed at 10/20/2023 1230 Gross per 24 hour  Intake 1344 ml  Output 2600 ml  Net -1256 ml   Filed Weights   10/18/23 1425  Weight: 95.3 kg   Body mass index is 31.93 kg/m.  Gen: 68 y.o. female in no apparent distress.  Nontoxic Pulm: Non-labored breathing.  Clear to auscultation bilaterally.  CV: Regular rate and rhythm.  No JVD.   GI: Abdomen soft, non-tender, non-distended, with normoactive bowel sounds. No organomegaly or masses felt. Ext: Warm, no deformities, no pedal edema Skin: No rashes, lesions no lower extremity ulcers Neuro: Alert and oriented. No focal neurological deficits. Psych: Calm  Judgement and insight appear normal. Mood & affect appropriate.  Pleasant   I have personally reviewed the following labs and images: CBC: Recent Labs  Lab 10/15/23 0827  10/18/23 1430 10/19/23 0649  WBC 8.7 8.2 8.4  NEUTROABS 5.9  --   --   HGB 11.2* 10.8* 10.6*  HCT 31.4* 29.5* 29.3*  MCV 94.6 93.9 93.9  PLT 303 301 281   BMP &GFR Recent Labs  Lab 10/15/23 0827 10/18/23 1430 10/18/23 1925 10/19/23 0345 10/19/23 0649 10/19/23 1635 10/19/23 1832 10/20/23 0934  NA 123* 119*   < > 122*  121* 128* 125* 128*  K 3.6 3.8  --   --  3.1*  --  4.1 3.8  CL 91* 84*  --   --  88*  --  92* 97*  CO2 23 23  --   --  26  --  24 25  GLUCOSE 94 105*  --   --  110*  --  112* 117*  BUN 23 21  --   --  17  --  20 19  CREATININE 1.12* 1.15*  --   --  1.03*  --  1.38* 1.26*  CALCIUM  9.2 9.1  --   --  8.7*  --  8.9 9.0  MG  --  1.6*  --   --  2.0  --   --   --   PHOS  --  2.8  --   --   --   --   --   --    < > = values in this interval not displayed.   Estimated Creatinine Clearance: 51.6 mL/min (A) (by C-G formula based on SCr of 1.26 mg/dL (H)). Liver & Pancreas: Recent Labs  Lab 10/15/23 0827 10/18/23 1430  AST 17 21  ALT 14 17  ALKPHOS 75 63  BILITOT 0.8 1.0  PROT 6.6 6.2*  ALBUMIN 3.8 3.4*   No results for input(s): LIPASE, AMYLASE in the last 168 hours. No results for input(s): AMMONIA in the last 168 hours. Diabetic: No results for input(s): HGBA1C in the last 72 hours. Recent Labs  Lab 10/18/23 1449  GLUCAP 107*   Cardiac Enzymes: No results for input(s): CKTOTAL, CKMB, CKMBINDEX, TROPONINI in the last 168 hours. No results for input(s): PROBNP in the last 8760 hours. Coagulation Profile: No results for input(s): INR, PROTIME in the last 168 hours. Thyroid  Function Tests: Recent Labs    10/18/23 1430  TSH 2.177  FREET4 1.32*   Lipid Profile: No results for input(s): CHOL, HDL, LDLCALC, TRIG, CHOLHDL, LDLDIRECT in the last 72 hours. Anemia Panel: Recent Labs    10/18/23 1925 10/18/23 2012  VITAMINB12  --  436  FERRITIN 223  --   TIBC 323  --   IRON 63  --    Urine analysis:    Component Value Date/Time   COLORURINE STRAW (A) 10/18/2023 1524   APPEARANCEUR CLEAR 10/18/2023 1524   LABSPEC 1.003 (L) 10/18/2023 1524   PHURINE 7.0 10/18/2023 1524   GLUCOSEU NEGATIVE 10/18/2023 1524   HGBUR NEGATIVE 10/18/2023 1524   BILIRUBINUR NEGATIVE 10/18/2023 1524   KETONESUR NEGATIVE 10/18/2023 1524   PROTEINUR 30 (A)  10/18/2023 1524   NITRITE NEGATIVE 10/18/2023 1524   LEUKOCYTESUR NEGATIVE 10/18/2023 1524   Sepsis Labs: Invalid input(s): PROCALCITONIN, LACTICIDVEN  Microbiology: No results found for this or any previous visit (from the past 240 hours).  Radiology Studies: No results found.  Scheduled Meds:  allopurinol   300 mg Oral q morning   B-complex with vitamin C  1 tablet Oral Daily   carvedilol   6.25 mg Oral BID   cholecalciferol   1,000 Units Oral q morning  enoxaparin  (LOVENOX ) injection  40 mg Subcutaneous Q24H   irbesartan   300 mg Oral Daily   levothyroxine   100 mcg Oral Q0600   rosuvastatin   20 mg Oral Daily   tamoxifen   10 mg Oral QPM   Continuous Infusions:   LOS: 1 day   35 minutes with more than 50% spent in reviewing records, counseling patient/family and coordinating care.  Reyes VEAR Gaw, MD [facility] [email or web address] 10/20/2023, 2:45 PM

## 2023-10-20 NOTE — Plan of Care (Signed)

## 2023-10-20 NOTE — Progress Notes (Signed)
 [facility] Progress Note  Name: Karen Mills MRN: [id number] DOB: [birth year]  Chief Complaint:  Abnormal sodium outpatient   Subjective:  She had 1.9 liters UOP over 1/11 as well as one unmeasured urine void.  She was on normal saline at 75 ml/hr for 12 hours on 1/11.  She feels ok this AM.  Spoke with her husband at bedside.  She states that I was addicted to water before - she would drink six of the 16.9 fluid oz bottles of water a day and would take water to bed.  She has an appointment with Dr. Dennise again on 10/31/23 - they are getting her blood pressure sorted out for a possible biopsy per the patient.   Review of systems:   Denies shortness of breath or chest pain Denies n/v ---------- Background on consult:  Karen Mills is a 68 y.o. female with a history of hypertension, hypothyroidism, breast cancer, CKD, and gout who presented to the hospital at the direction of her outpatient nephrologist, Dr. Dennise, for hyponatremia.  She was started on chlorthalidone three weeks ago.  She was found to have her sodium that had dropped from 136 on 09/10/23 to 123 on 10/15/23.  She was told today to stop the chlorthalidone and then was sent to the ER per her most recent outpatient labs which per report indicated sodium of 117.  Nephrology is consulted for assistance with management of hyponatremia.  Note that she also does drink six 16.9 oz bottles of water a day.  She feels tired.  She was started on fluid restriction of 1.5 liters per day.  Lasix  is listed as a home med.  She has actually been on lasix  for a while, concurrently with the chlorthalidone.  Last dose of chlorthalidone was this morning before her appointment with Dr. Dennise.  She waits a moment to get her bearings after starting up but doesn't feel this is too out of the ordinary.  She feels alright other than some fatigue - she was surprised to find out about the labs being off and shocked when she found out that she  needed to come to the hospital.      Intake/Output Summary (Last 24 hours) at 10/20/2023 1224 Last data filed at 10/20/2023 0900 Gross per 24 hour  Intake 1464 ml  Output 2300 ml  Net -836 ml    Vitals:  Vitals:   10/19/23 2011 10/19/23 2358 10/20/23 0421 10/20/23 0840  BP: (!) 141/55 (!) 140/60 (!) 153/59 (!) 120/55  Pulse: 69 61 62 64  Resp: 18 18 18 18   Temp: 98.2 F (36.8 C) 97.9 F (36.6 C) 98 F (36.7 C) 98 F (36.7 C)  TempSrc: Oral Oral Oral Oral  SpO2: 100% 99% 99% 99%  Weight:      Height:         Physical Exam:   General: adult female in bed in NAD at rest  HEENT: NCAT Eyes: EOMI sclera anicteric Neck: supple trachea midline  Heart: S1S2 no rub Lungs: clear and unlabored; normal work of breathing at rest on room air  Abdomen: soft/nt/nd Extremities: no pitting edema; no cyanosis or clubbing Skin: no rash on extremities exposed  Neuro: alert and oriented x 3 provides hx and follows commands  Psych normal mood and affect  Medications reviewed   Labs:     Latest Ref Rng & Units 10/20/2023    9:34 AM 10/19/2023    6:32 PM 10/19/2023    4:35 PM  BMP  Glucose 70 - 99 mg/dL 882  887    BUN 8 - 23 mg/dL 19  20    Creatinine 9.55 - 1.00 mg/dL 8.73  8.61    Sodium 864 - 145 mmol/L 128  125  128   Potassium 3.5 - 5.1 mmol/L 3.8  4.1    Chloride 98 - 111 mmol/L 97  92    CO2 22 - 32 mmol/L 25  24    Calcium  8.9 - 10.3 mg/dL 9.0  8.9       Assessment/Plan:   # Hyponatremia - Secondary to chlorthalidone.  TSH is acceptable.  AM cortisol is acceptable - We have stopped chlorthalidone indefinitely and paused lasix   - Sodium dropped slightly with hydration.  (Was given as s/p two diuretics and orthostatics were positive) - Resume fluid restriction at 1.2 liters   - Defer additional fluids  - Please avoid thiazide diuretics such as hydrochlorothiazide or chlorthalidone    # HTN  - Controlled on current regimen.  - Please avoid thiazide diuretics such as  hydrochlorothiazide or chlorthalidone.  I have listed chlorthalidone as allergy causing severe hyponatremia   # CKD stage 3a - Mild  - Note urine protein/cr ratio was checked today at 950 mg/g.  Unsure of prior labs for comparison and can be reassessed outpatient  Disposition - continue inpatient monitoring.  She would be acceptable for discharge as early as 1/13 from a strictly renal standpoint if her sodium is in the low 130's.  She has a follow-up with nephrology/Dr. Dennise on 10/31/23 already set up  Katheryn JAYSON Saba, MD 10/20/2023 12:46 PM

## 2023-10-21 DIAGNOSIS — E871 Hypo-osmolality and hyponatremia: Secondary | ICD-10-CM | POA: Diagnosis not present

## 2023-10-21 LAB — BASIC METABOLIC PANEL
Anion gap: 7 (ref 5–15)
BUN: 20 mg/dL (ref 8–23)
CO2: 24 mmol/L (ref 22–32)
Calcium: 9.3 mg/dL (ref 8.9–10.3)
Chloride: 99 mmol/L (ref 98–111)
Creatinine, Ser: 1.28 mg/dL — ABNORMAL HIGH (ref 0.44–1.00)
GFR, Estimated: 46 mL/min — ABNORMAL LOW (ref >60)
Glucose, Bld: 104 mg/dL — ABNORMAL HIGH (ref 70–99)
Potassium: 4.6 mmol/L (ref 3.5–5.1)
Sodium: 130 mmol/L — ABNORMAL LOW (ref 135–145)

## 2023-10-21 NOTE — Progress Notes (Signed)
 Karen Mills  Assessment/ Plan: Pt is a 68 y.o. yo female with past medical history significant for hypertension, hypothyroidism, breast cancer, CKD who was presented with hyponatremia.  # Hyponatremia - Secondary to chlorthalidone.  TSH is acceptable.  AM cortisol is acceptable - We have stopped chlorthalidone indefinitely   -The sodium level has gradually improved to 130 today.  May be able to resume furosemide  as needed for lower extremity edema. - Please avoid thiazide diuretics such as hydrochlorothiazide or chlorthalidone    # HTN  - Controlled on current regimen including irbesartan  and carvedilol .  No thiazide as discussed above.   # CKD stage 3a, subnephrotic proteinuria: Follow as outpatient.    The patient follows with Dr. Dennise and has appointment on 1/23.  Okay to discharge from nephrology perspective.  Subjective: Seen and examined at the bedside.  Patient denies nausea, vomiting however some headache after not sleeping last night.  Labs improved and no other new event. Objective Vital signs in last 24 hours: Vitals:   10/20/23 2034 10/20/23 2227 10/21/23 0417 10/21/23 0755  BP: (!) 150/65 (!) 146/60 (!) 158/57 (!) 172/72  Pulse: 64 66 61 62  Resp: 19 16 19 20   Temp: 98.1 F (36.7 C)  97.9 F (36.6 C) 97.9 F (36.6 C)  TempSrc: Oral  Oral Oral  SpO2: 98% 97% 99% 100%  Weight:      Height:       Weight change:   Intake/Output Summary (Last 24 hours) at 10/21/2023 1014 Last data filed at 10/21/2023 0830 Gross per 24 hour  Intake --  Output 2370 ml  Net -2370 ml       Labs: RENAL PANEL Recent Labs  Lab 10/15/23 0827 10/18/23 1430 10/18/23 1925 10/19/23 0649 10/19/23 1635 10/19/23 1832 10/20/23 0934 10/20/23 1506 10/21/23 0509  NA 123* 119*   < > 121* 128* 125* 128* 129* 130*  K 3.6 3.8  --  3.1*  --  4.1 3.8  --  4.6  CL 91* 84*  --  88*  --  92* 97*  --  99  CO2 23 23  --  26  --  24 25  --  24  GLUCOSE  94 105*  --  110*  --  112* 117*  --  104*  BUN 23 21  --  17  --  20 19  --  20  CREATININE 1.12* 1.15*  --  1.03*  --  1.38* 1.26*  --  1.28*  CALCIUM  9.2 9.1  --  8.7*  --  8.9 9.0  --  9.3  MG  --  1.6*  --  2.0  --   --   --   --   --   PHOS  --  2.8  --   --   --   --   --   --   --   ALBUMIN 3.8 3.4*  --   --   --   --   --   --   --    < > = values in this interval not displayed.    Liver Function Tests: Recent Labs  Lab 10/15/23 0827 10/18/23 1430  AST 17 21  ALT 14 17  ALKPHOS 75 63  BILITOT 0.8 1.0  PROT 6.6 6.2*  ALBUMIN 3.8 3.4*   No results for input(s): LIPASE, AMYLASE in the last 168 hours. No results for input(s): AMMONIA in the last 168 hours. CBC: Recent  Labs    11/07/22 0844 11/08/22 1536 12/12/22 1033 01/15/23 0927 01/15/23 0928 04/16/23 9166 04/16/23 0834 05/07/23 0921 08/13/23 0829 09/10/23 0944 10/15/23 0827 10/18/23 1430 10/18/23 1925 10/18/23 2012 10/19/23 0649  HGB 9.6*  --  9.5*  --    < > 10.0*  --    < > 11.2* 10.7* 11.2* 10.8*  --   --  10.6*  MCV 99.3  --  97.9  --    < > 95.2  --    < > 100.0 100.3* 94.6 93.9  --   --  93.9  VITAMINB12  --  410  --   --   --   --   --   --   --   --   --   --   --  436  --   FOLATE  --  >40.0  --   --   --   --   --   --   --   --   --   --   --   --   --   FERRITIN 209  --  193  --   --   --  151  --   --   --   --   --  223  --   --   TIBC 265  --  258  --   --  267  --   --   --   --   --   --  323  --   --   IRON 47  --  34  --   --  45  --   --   --   --   --   --  63  --   --   RETICCTPCT  --  1.3  --  0.9  --   --   --   --   --   --   --   --   --   --   --    < > = values in this interval not displayed.    Cardiac Enzymes: No results for input(s): CKTOTAL, CKMB, CKMBINDEX, TROPONINI in the last 168 hours. CBG: Recent Labs  Lab 10/18/23 1449  GLUCAP 107*    Iron Studies:  Recent Labs    10/18/23 1925  IRON 63  TIBC 323  FERRITIN 223   Studies/Results: No  results found.  Medications: Infusions:   Scheduled Medications:  allopurinol   300 mg Oral q morning   B-complex with vitamin C  1 tablet Oral Daily   carvedilol   6.25 mg Oral BID   cholecalciferol   1,000 Units Oral q morning   enoxaparin  (LOVENOX ) injection  40 mg Subcutaneous Q24H   irbesartan   300 mg Oral Daily   levothyroxine   100 mcg Oral Q0600   rosuvastatin   20 mg Oral Daily   tamoxifen   10 mg Oral QPM    have reviewed scheduled and prn medications.  Physical Exam: General:NAD, comfortable Heart:RRR, s1s2 nl Lungs:clear b/l, no crackle Abdomen:soft, Non-tender, non-distended Extremities:No edema Neurology: Alert, awake and following commands.  Darion Juhasz Prasad Yassmin Binegar 10/21/2023,10:14 AM  LOS: 2 days

## 2023-10-21 NOTE — Discharge Summary (Signed)
 Physician Discharge Summary   Patient: Karen Mills MRN: [id number] DOB: 19-Feb-1956  Admit date:     10/18/2023  Discharge date: 10/21/23  Discharge Physician: Reyes VEAR Gaw   PCP: Okey Carlin Redbird, MD   Recommendations at discharge:   Follow-up with a BMP to ensure that sodium continues to correct well and hyponatremia is completely resolved. BMP ordered for this coming Thursday.  Patient instructed to call nephrologist to ensure that their office will accept an outside BMP from the hospital.  Discharge Diagnoses: Principal Problem:   Hyponatremia Active Problems:   Hypomagnesemia  Resolved Problems:   * No resolved hospital problems. *   Brief Narrative: 68 y.o. female with medical history significant of left breast cancer currently on tamoxifen , gout, HTN, HLD, hypothyroidism, varicose vein, proteinuria and CKD 3A who was advised by her nephrologist to present to the ED for evaluation due to sodium of 117.  Being followed by nephrology sodium uptrending well.  On day of discharge her sodium was 130.  Had been 128 the day before.  Nephrology saw patient and deemed patient ready for discharge.     Assessment & Plan: Hypotonic hyponatremia associated mild hypochloremia Resolved with fluid restriction.  Never received hypertonic saline. Completely asymptomatic on day of discharge.  Her lightheadedness today before has completely resolved. Subjectively very ready to go home. Nephrology has seen patient and agrees she can be discharged today with follow-up.  She already has follow-up with nephrologist in 10 days scheduled. BMP ordered for this coming Thursday to recheck   Hypomagnesemia Magnesium  of 1.6.  Received IV mag 2 g in the ED. Repeat magnesium  level was normal   Hypokalemia Resolved   CKD IIIa Creatinine of 1.15 seems to be around his baseline.   HTN BP elevated with SBP in the 140s to 170s. Continue Coreg  and valsartan  No further thiazide diuretics per  nephrology   Normocytic anemia Hemoglobin of 10.8 seems to be around baseline of 10-11.  Anemia panel within normal limits Likely secondary to   HLD Continue rosuvastatin    Hx breast cancer Stage Ia breast cancer of the left breast. Currently on tamoxifen  and follows closely with the cancer center. Continue tamoxifen    Gout Continue allopurinol       Consultants: Nephrology Procedures performed: None Disposition: Home Diet recommendation:  Discharge Diet Orders (From admission, onward)     Start     Ordered   10/21/23 0000  Diet general        10/21/23 1119           Regular diet DISCHARGE MEDICATION: Allergies as of 10/21/2023       Reactions   Chlorthalidone Other (See Comments)   Severe Hyponatremia   Accupril [quinapril Hcl] Cough   Amlodipine Besylate Swelling   Leg swelling   Atacand [candesartan] Other (See Comments)   Unknown reaction   Atorvastatin  Other (See Comments)   Joint pain   Cozaar [losartan Potassium] Other (See Comments)   Stomach upset   Hydrochlorothiazide W-triamterene Other (See Comments)   Unknown reaction Other Reaction(s): not helpful   Tiazac [diltiazem Hcl Er Beads] Other (See Comments)   Unknown reaction   Zestril [lisinopril] Cough        Medication List     TAKE these medications    allopurinol  300 MG tablet Commonly known as: ZYLOPRIM  Take 300 mg by mouth every morning.   B-complex with vitamin C tablet Take 1 tablet by mouth daily.   Biotin  10000 MCG Tabs Take  10,000 mcg by mouth every morning.   carvedilol  6.25 MG tablet Commonly known as: COREG  Take 6.25 mg by mouth 2 (two) times daily.   cholecalciferol  25 MCG (1000 UNIT) tablet Commonly known as: VITAMIN D3 Take 1,000 Units by mouth every morning.   furosemide  20 MG tablet Commonly known as: LASIX  Take 20 mg by mouth 2 (two) times daily.   HAIR FORMULA EXTRA STRENGTH PO 4 tablets Orally once a day   Krill Oil 1000 MG Caps 1 capsule Orally  once a day   levothyroxine  100 MCG tablet Commonly known as: SYNTHROID  Take 100 mcg by mouth every evening.   rosuvastatin  20 MG tablet Commonly known as: CRESTOR  Take 1 tablet (20 mg total) by mouth daily.   tamoxifen  10 MG tablet Commonly known as: NOLVADEX  TAKE ONE TABLET BY MOUTH DAILY What changed: when to take this   valsartan  320 MG tablet Commonly known as: DIOVAN  Take 320 mg by mouth daily.   Vitamin E 670 MG (1000 UT) Caps Take 1,000 Units by mouth every morning.        Discharge Exam: Filed Weights   10/18/23 1425  Weight: 95.3 kg   Gen: 68 y.o. female in no apparent distress.  Nontoxic Pulm: Non-labored breathing.  Clear to auscultation bilaterally.  CV: Regular rate and rhythm.  No JVD.   GI: Abdomen soft, non-tender, non-distended, with normoactive bowel sounds. No organomegaly or masses felt. Ext: Warm, no deformities, no pedal edema Skin: No rashes, lesions no lower extremity ulcers Neuro: Alert and oriented. No focal neurological deficits. Psych: Calm  Judgement and insight appear normal. Mood & affect appropriate.  Pleasant  Condition at discharge: good  The results of significant diagnostics from this hospitalization (including imaging, microbiology, ancillary and laboratory) are listed below for reference.   Imaging Studies: No results found.  Microbiology: Results for orders placed or performed during the hospital encounter of 12/01/21  Resp Panel by RT-PCR (Flu A&B, Covid) Nasopharyngeal Swab     Status: Abnormal   Collection Time: 12/01/21  8:43 AM   Specimen: Nasopharyngeal Swab; Nasopharyngeal(NP) swabs in vial transport medium  Result Value Ref Range Status   SARS Coronavirus 2 by RT PCR POSITIVE (A) NEGATIVE Final    Comment: (NOTE) SARS-CoV-2 target nucleic acids are DETECTED.  The SARS-CoV-2 RNA is generally detectable in upper respiratory specimens during the acute phase of infection. Positive results are indicative of the  presence of the identified virus, but do not rule out bacterial infection or co-infection with other pathogens not detected by the test. Clinical correlation with patient history and other diagnostic information is necessary to determine patient infection status. The expected result is Negative.  Fact Sheet for Patients: bloggercourse.com  Fact Sheet for Healthcare Providers: seriousbroker.it  This test is not yet approved or cleared by the [place]  FDA and  has been authorized for detection and/or diagnosis of SARS-CoV-2 by FDA under an Emergency Use Authorization (EUA).  This EUA will remain in effect (meaning this test can be used) for the duration of  the COVID-19 declaration under Section 564(b)(1) of the A ct, 21 U.S.C. section 360bbb-3(b)(1), unless the authorization is terminated or revoked sooner.     Influenza A by PCR NEGATIVE NEGATIVE Final   Influenza B by PCR NEGATIVE NEGATIVE Final    Comment: (NOTE) The Xpert Xpress SARS-CoV-2/FLU/RSV plus assay is intended as an aid in the diagnosis of influenza from Nasopharyngeal swab specimens and should not be used as a sole basis for  treatment. Nasal washings and aspirates are unacceptable for Xpert Xpress SARS-CoV-2/FLU/RSV testing.  Fact Sheet for Patients: bloggercourse.com  Fact Sheet for Healthcare Providers: seriousbroker.it  This test is not yet approved or cleared by the [place]  FDA and has been authorized for detection and/or diagnosis of SARS-CoV-2 by FDA under an Emergency Use Authorization (EUA). This EUA will remain in effect (meaning this test can be used) for the duration of the COVID-19 declaration under Section 564(b)(1) of the Act, 21 U.S.C. section 360bbb-3(b)(1), unless the authorization is terminated or revoked.  Performed at [facility], [address], [place], [place] [address]    MRSA Next Gen by PCR, Nasal     Status: None   Collection Time: 12/02/21 12:30 AM   Specimen: Nasal Mucosa; Nasal Swab  Result Value Ref Range Status   MRSA by PCR Next Gen NOT DETECTED NOT DETECTED Final    Comment: (NOTE) The GeneXpert MRSA Assay (FDA approved for NASAL specimens only), is one component of a comprehensive MRSA colonization surveillance program. It is not intended to diagnose MRSA infection nor to guide or monitor treatment for MRSA infections. Test performance is not FDA approved in patients less than 46 years old. Performed at [facility], [address], [place], [place] [address]     Labs: CBC: Recent Labs  Lab 10/15/23 0827 10/18/23 1430 10/19/23 0649  WBC 8.7 8.2 8.4  NEUTROABS 5.9  --   --   HGB 11.2* 10.8* 10.6*  HCT 31.4* 29.5* 29.3*  MCV 94.6 93.9 93.9  PLT 303 301 281   Basic Metabolic Panel: Recent Labs  Lab 10/18/23 1430 10/18/23 1925 10/19/23 0649 10/19/23 1635 10/19/23 1832 10/20/23 0934 10/20/23 1506 10/21/23 0509  NA 119*   < > 121* 128* 125* 128* 129* 130*  K 3.8  --  3.1*  --  4.1 3.8  --  4.6  CL 84*  --  88*  --  92* 97*  --  99  CO2 23  --  26  --  24 25  --  24  GLUCOSE 105*  --  110*  --  112* 117*  --  104*  BUN 21  --  17  --  20 19  --  20  CREATININE 1.15*  --  1.03*  --  1.38* 1.26*  --  1.28*  CALCIUM  9.1  --  8.7*  --  8.9 9.0  --  9.3  MG 1.6*  --  2.0  --   --   --   --   --   PHOS 2.8  --   --   --   --   --   --   --    < > = values in this interval not displayed.   Liver Function Tests: Recent Labs  Lab 10/15/23 0827 10/18/23 1430  AST 17 21  ALT 14 17  ALKPHOS 75 63  BILITOT 0.8 1.0  PROT 6.6 6.2*  ALBUMIN 3.8 3.4*   CBG: Recent Labs  Lab 10/18/23 1449  GLUCAP 107*    Discharge time spent: less than 30 minutes.  Signed: Reyes VEAR Gaw, MD [facility] 10/21/2023

## 2023-10-21 NOTE — Plan of Care (Signed)

## 2023-10-22 ENCOUNTER — Ambulatory Visit (INDEPENDENT_AMBULATORY_CARE_PROVIDER_SITE_OTHER): Payer: Medicare Other

## 2023-10-24 ENCOUNTER — Inpatient Hospital Stay (HOSPITAL_BASED_OUTPATIENT_CLINIC_OR_DEPARTMENT_OTHER): Payer: Medicare Other | Admitting: Hematology and Oncology

## 2023-10-24 DIAGNOSIS — N1831 Chronic kidney disease, stage 3a: Secondary | ICD-10-CM | POA: Diagnosis not present

## 2023-10-24 DIAGNOSIS — Z170 Estrogen receptor positive status [ER+]: Secondary | ICD-10-CM

## 2023-10-24 DIAGNOSIS — D649 Anemia, unspecified: Secondary | ICD-10-CM | POA: Diagnosis not present

## 2023-10-24 DIAGNOSIS — C50512 Malignant neoplasm of lower-outer quadrant of left female breast: Secondary | ICD-10-CM | POA: Diagnosis not present

## 2023-10-24 NOTE — Assessment & Plan Note (Addendum)
Lab review: 12/12/2022: Ferritin 193, iron saturation 13%, hemoglobin 9.5, creatinine 1.42 01/15/2023: Hemoglobin 9.4, MCV 98.3, platelets 317 02/12/2023: Hemoglobin 9.7 03/27/2023: Hemoglobin 10 05/07/2023: Hemoglobin 10.5 (proceed with today's injection) 05/28/2023: Hemoglobin 10.3 06/19/2023: Hemoglobin 10.4 09/10/2023: Hemoglobin 10.7   anemia of chronic kidney disease stage II-III. Current treatment: Retacrit 40,000 units every 3 weeks started 01/22/2023 (last injection was on 06/18/2023) Goal hemoglobin: 10.5 g Patient has not required Retacrit injections since 06/19/2023  Hospitalization 10/18/2023-10/21/2023: Severe hyponatremia: Resolved with fluid restriction

## 2023-10-24 NOTE — Assessment & Plan Note (Addendum)
04/15/2019:Screening mammogram detected 0.4cm mass in the left breast at the 6 o'clock position with no left axillary adenopathy. Biopsy on 04/03/19 showed IDC, grade 1-2, HER-2 - (0), ER +95%, PR+ 95%, Ki67 15%.  T1 a N0 stage Ia clinical stage   Treatment plan: 1.  Breast conserving surgery with sentinel lymph node biopsy 04/30/2019: Grade 1 IDC 0.8 cm, 0/3 lymph nodes, ER 95%, PR 95%, KI 6715%, HER-2 negative, T1BN0 stage Ia 2.  Adjuvant radiation therapy 06/02/2019- 3.  Follow-up adjuvant antiestrogen therapy with anastrozole 1 mg daily x5 years -------------------------------------------------------------------------------------------------------------------- Current treatment: Adjuvant antiestrogen therapy with anastrozole 1 mg daily started 07/09/2019 (discontinued due to severe joint aches and pains) switched to tamoxifen 10 mg started 01/11/2020 (planned for 10 years)   Tamoxifen toxicities: Tolerating it extremely well BCI predictive for extended endocrine therapy benefit.   Breast cancer surveillance: Mammogram 04/10/2023: Benign, breast density category B

## 2023-10-24 NOTE — Progress Notes (Signed)
HEMATOLOGY-ONCOLOGY TELEPHONE VISIT PROGRESS NOTE  I connected with our patient on 10/24/23 at 11:45 AM EST by telephone and verified that I am speaking with the correct person using two identifiers.  I discussed the limitations, risks, security and privacy concerns of performing an evaluation and management service by telephone and the availability of in person appointments.  I also discussed with the patient that there may be a patient responsible charge related to this service. The patient expressed understanding and agreed to proceed.   History of Present Illness:  History of Present Illness   The patient, with a history of anemia and low sodium, presents for a follow-up after a recent hospitalization. She reports feeling better since her discharge. During her hospital stay, she had multiple lab checks, primarily related to her sodium levels. She reports that her sodium level has improved to 130, and she had additional blood work done today to monitor it. She is currently limiting her intake to manage her sodium levels.  Her hemoglobin levels during the hospital stay ranged between 10.7 and 11.2, indicating that her anemia is currently stable. She has not required any injections for anemia since September. She is scheduled for regular lab appointments throughout the year to monitor her condition.  She is currently on tamoxifen for breast cancer prevention, with enough refills until the middle of the year. She reports no other issues or needs at this time.        Oncology History  Malignant neoplasm of lower-outer quadrant of left breast of female, estrogen receptor positive (HCC)  04/03/2019 Cancer Staging   Staging form: Breast, AJCC 8th Edition - Clinical stage from 04/03/2019: Stage IA (cT1a, cN0, cM0, G2, ER+, PR+, HER2-) - Signed by Loa Socks, NP on 04/15/2019   04/15/2019 Initial Diagnosis   Screening mammogram detected 0.4cm mass in the left breast at the 6 o'clock position  with no left axillary adenopathy. Biopsy on 04/03/19 showed IDC, grade 1-2, HER-2 - (0), ER +95%, PR+ 95%, Ki67 15%.    04/30/2019 Surgery   Left lumpectomy Carolynne Edouard): IDC, grade 1, 0.8cm, intermediate grade DCIS, lymphovascular invasion present, clear margins. Three left axillary lymph nodes negative for carcinoma.    06/02/2019 -  Radiation Therapy   Adjuvant radiation   08/2019 -  Anti-estrogen oral therapy   Anastrozole daily stopped 10/2019 due to severe joint pain; switched to tamoxifen 10mg  on 01/11/20   10/06/2021 Genetic Testing   Negative hereditary cancer genetic testing: no pathogenic variants detected in [facility] CancerNext-Expanded +RNAinsight Panel.  Variants of uncertain significance detected in TSC1 at  p.L439V (c.1315C>G) and in TSC2 at p.D624N (c.1870G>A).  The report date is 10/06/2021.  The CancerNext-Expanded gene panel offered by [facility] and includes sequencing, rearrangement, and RNA analysis for the following 77 genes: AIP, ALK, APC, ATM, AXIN2, BAP1, BARD1, BLM, BMPR1A, BRCA1, BRCA2, BRIP1, CDC73, CDH1, CDK4, CDKN1B, CDKN2A, CHEK2, CTNNA1, DICER1, FANCC, FH, FLCN, GALNT12, KIF1B, LZTR1, MAX, MEN1, MET, MLH1, MSH2, MSH3, MSH6, MUTYH, NBN, NF1, NF2, NTHL1, PALB2, PHOX2B, PMS2, POT1, PRKAR1A, PTCH1, PTEN, RAD51C, RAD51D, RB1, RECQL, RET, SDHA, SDHAF2, SDHB, SDHC, SDHD, SMAD4, SMARCA4, SMARCB1, SMARCE1, STK11, SUFU, TMEM127, TP53, TSC1, TSC2, VHL and XRCC2 (sequencing and deletion/duplication); EGFR, EGLN1, HOXB13, KIT, MITF, PDGFRA, POLD1, and POLE (sequencing only); EPCAM and GREM1 (deletion/duplication only).  UPDATE: TSC1 p.L439V VUS has been amended to Benign. Amended report date is 08/13/2022.     REVIEW OF SYSTEMS:   Constitutional: Denies fevers, chills or abnormal weight loss All other systems  were reviewed with the patient and are negative. Observations/Objective:     Assessment Plan:  Malignant neoplasm of lower-outer quadrant of left breast of female, estrogen  receptor positive (HCC) 04/15/2019:Screening mammogram detected 0.4cm mass in the left breast at the 6 o'clock position with no left axillary adenopathy. Biopsy on 04/03/19 showed IDC, grade 1-2, HER-2 - (0), ER +95%, PR+ 95%, Ki67 15%.  T1 a N0 stage Ia clinical stage   Treatment plan: 1.  Breast conserving surgery with sentinel lymph node biopsy 04/30/2019: Grade 1 IDC 0.8 cm, 0/3 lymph nodes, ER 95%, PR 95%, KI 6715%, HER-2 negative, T1BN0 stage Ia 2.  Adjuvant radiation therapy 06/02/2019- 3.  Follow-up adjuvant antiestrogen therapy started 07/09/2019 -------------------------------------------------------------------------------------------------------------------- Current treatment: Adjuvant antiestrogen therapy with anastrozole 1 mg daily started 07/09/2019 (discontinued due to severe joint aches and pains) switched to tamoxifen 10 mg started 01/11/2020 (planned for 10 years)   Tamoxifen toxicities: Tolerating it extremely well BCI predictive for extended endocrine therapy benefit.   Breast cancer surveillance: Mammogram 04/10/2023: Benign, breast density category B  Normocytic normochromic anemia Lab review: 12/12/2022: Ferritin 193, iron saturation 13%, hemoglobin 9.5, creatinine 1.42 01/15/2023: Hemoglobin 9.4, MCV 98.3, platelets 317 02/12/2023: Hemoglobin 9.7 03/27/2023: Hemoglobin 10 05/07/2023: Hemoglobin 10.5 (proceed with today's injection) 05/28/2023: Hemoglobin 10.3 06/19/2023: Hemoglobin 10.4 09/10/2023: Hemoglobin 10.7 10/18/23: Hemoglobin 10.8   anemia of chronic kidney disease stage II-III. Current treatment: Retacrit 40,000 units every 3 weeks started 01/22/2023 (last injection was on 06/18/2023) Goal hemoglobin: 10.5 g Patient has not required Retacrit injections since 06/19/2023  Hospitalization 10/18/2023-10/21/2023: Severe hyponatremia: Resolved with fluid restriction --------------------------------- Assessment and Plan    Hyponatremia Recent hospitalization for low sodium.  Sodium improved to 130. Awaiting results of recent lab work. -Continue current sodium management plan. -Check sodium level with next scheduled lab work.  Anemia Hemoglobin stable between 10.7 and 11.2 during recent hospitalization. No recent need for injections to boost hemoglobin. -Continue current anemia management plan. -Check hemoglobin level with next scheduled lab work.  Breast Cancer On Tamoxifen with enough refills until mid-year. -Continue Tamoxifen as prescribed.  Follow-up Scheduled lab appointments every two months with a follow-up appointment in December. -Attend scheduled lab appointments. -Follow-up appointment in December.      I discussed the assessment and treatment plan with the patient. The patient was provided an opportunity to ask questions and all were answered. The patient agreed with the plan and demonstrated an understanding of the instructions. The patient was advised to call back or seek an in-person evaluation if the symptoms worsen or if the condition fails to improve as anticipated.   I provided 20 minutes of non-face-to-face time during this encounter.  This includes time for charting and coordination of care   Tamsen Meek, MD

## 2023-10-25 ENCOUNTER — Telehealth: Payer: Medicare Other | Admitting: Hematology and Oncology

## 2023-10-31 DIAGNOSIS — E871 Hypo-osmolality and hyponatremia: Secondary | ICD-10-CM | POA: Diagnosis not present

## 2023-10-31 DIAGNOSIS — R768 Other specified abnormal immunological findings in serum: Secondary | ICD-10-CM | POA: Diagnosis not present

## 2023-10-31 DIAGNOSIS — N1831 Chronic kidney disease, stage 3a: Secondary | ICD-10-CM | POA: Diagnosis not present

## 2023-10-31 DIAGNOSIS — I129 Hypertensive chronic kidney disease with stage 1 through stage 4 chronic kidney disease, or unspecified chronic kidney disease: Secondary | ICD-10-CM | POA: Diagnosis not present

## 2023-10-31 DIAGNOSIS — R809 Proteinuria, unspecified: Secondary | ICD-10-CM | POA: Diagnosis not present

## 2023-11-04 ENCOUNTER — Telehealth: Payer: Self-pay | Admitting: Internal Medicine

## 2023-11-04 NOTE — Telephone Encounter (Signed)
Error

## 2023-11-04 NOTE — Telephone Encounter (Signed)
Patient was referred to the [facility]N [facility] with Dr. Duke Salvia, but she would like to see Dr. Duke Salvia for HTN and Gen Card. Patient states it would make it easier for her to have one doctor at one facility. Would both of you approve the switch?

## 2023-11-20 ENCOUNTER — Encounter (HOSPITAL_BASED_OUTPATIENT_CLINIC_OR_DEPARTMENT_OTHER): Payer: Self-pay | Admitting: Cardiovascular Disease

## 2023-11-20 ENCOUNTER — Ambulatory Visit (HOSPITAL_BASED_OUTPATIENT_CLINIC_OR_DEPARTMENT_OTHER): Payer: Medicare Other | Admitting: Cardiovascular Disease

## 2023-11-20 VITALS — BP 174/93 | HR 67 | Ht 68.0 in | Wt 221.4 lb

## 2023-11-20 DIAGNOSIS — E7800 Pure hypercholesterolemia, unspecified: Secondary | ICD-10-CM

## 2023-11-20 DIAGNOSIS — I10 Essential (primary) hypertension: Secondary | ICD-10-CM

## 2023-11-20 MED ORDER — CLONIDINE 0.1 MG/24HR TD PTWK: 0.1000 mg | MEDICATED_PATCH | TRANSDERMAL | 12 refills | Status: DC

## 2023-11-20 NOTE — Patient Instructions (Addendum)
Medication Instructions:  START CLONIDINE 0.1 MG PATCH WEEKLY   Labwork: NONE  Testing/Procedures: NONE  Follow-Up: MONTHLY FOR 4 VISITS, FIRST 3 WILL BE WITH PHARM D AND 4TH VISIT WITH DR [facility]    Any Other Special Instructions Will Be Listed Below (If Applicable). MONITOR YOUR BLOOD PRESSURE TWICE A DAY, LOG IN THE BOOK PROVIDED. BRING THE BOOK AND YOUR BLOOD PRESSURE MACHINE TO YOUR FOLLOW UP IN 1 MONTH   [email or web address] for an accurate blood pressure cuff.   YOU HAVE BEEN REFERRED TO PREP PROGRAM

## 2023-11-20 NOTE — Progress Notes (Signed)
 [facility] Initial Assessment:    Date:  11/20/2023   ID:  Karen Mills, DOB 16-Apr-1956, MRN [id number]  PCP:  Daisy Floro, MD  Cardiologist:  Chilton Si, MD   Referring MD: Anthony Sar, MD   CC: Hypertension  History of Present Illness:    Karen Mills is a 68 y.o. female with a hx of CAD, hypertension, hyperlipidemia, breast cancer on tamoxifen, gout, hypothyroidism,and chronic kidney disease 3a here to establish care in the [facility].   She is struggled with multiple medications to try and get her blood pressure under control.  She last saw Karen Favre, PA-C 05/2023 and her blood pressure was 132/62 on carvedilol and hydralazine.  She was admitted 10/2023 for hyponatremia that was thought to be due to chlorthalidone.  This resolved with fluid restriction.  BP during that hospitalization was in the 140-170s while on irbesartan and carvedilol.   Karen Mills experiences difficulty regulating her blood pressure, with fluctuations between high and low readings. Despite taking valsartan at bedtime, she wakes up with high blood pressure readings, such as 190/90 mmHg. During the day, her blood pressure is generally stable, but there are instances of hypotension. She is currently on carvedilol and valsartan for blood pressure management and takes furosemide for occasional ankle swelling. She was previously prescribed chlorthalidone, which resulted in hospitalization due to adverse effects.  She has a history of breast cancer diagnosed in 2020 and is currently on tamoxifen. She notes that tamoxifen may have contributed to joint pain and night sweats. She is uncertain if her blood pressure issues began before or after starting tamoxifen.  Her family history is significant for cardiovascular issues, including heart attacks and open-heart surgeries in her father and brother, and congestive heart failure in her mother. Heart problems are prevalent  in her family, with her mother experiencing a heart attack at a young age.  Her social history includes limited physical activity due to knee and hip pain, for which she occasionally receives injections. She used to walk on a treadmill but has stopped due to joint discomfort. She has a free membership to a gym but is unsure which exercises would not exacerbate her joint pain. She cooks at home most of the time, with occasional dining out on weekends. She acknowledges being a 'salt person' and has reduced salt intake after a hospitalization for low sodium levels. She drinks a small amount of caffeine daily and consumes alcohol occasionally on weekends.  No excessive snoring and she feels rested upon waking. She sometimes feels sleepy during the day, depending on the weather and her activities. No recent changes in stress or anxiety levels, although she describes herself as generally anxious. She is diligent about taking her medications.     Previous antihypertensives: Quinipiril Amlodipine Candesartan Losartan Hydrochlorothiazide-triamterene Diltiazem  Lisinopril Chlorthalidone- hyponatremia Hydralazine Spironolactone clonidine  Past Medical History:  Diagnosis Date   Breast cancer (HCC) 2020   Left Breast Cancer   CKD (chronic kidney disease)    Gout    Hyperlipidemia    Hypertension    Hypothyroidism    Megaloblastic anemia    Obesity    Personal history of radiation therapy 2020   Left Breast Cancer   PONV (postoperative nausea and vomiting)    SCC (squamous cell carcinoma) Keratoacanthoma 10/10/2016   Right Shin (Cx3,5FU)   Squamous cell carcinoma in situ (SCCIS) 11/15/2016   Right Lower Shin (tx p bx)   Varicose veins  Past Surgical History:  Procedure Laterality Date   ABLATION ON ENDOMETRIOSIS     BREAST LUMPECTOMY Left 04/30/2019   BREAST LUMPECTOMY WITH RADIOACTIVE SEED AND SENTINEL LYMPH NODE BIOPSY Left 04/30/2019   Procedure: LEFT BREAST LUMPECTOMY WITH  RADIOACTIVE SEED AND SENTINEL LYMPH NODE BIOPSY;  Surgeon: Griselda Miner, MD;  Location: [facility];  Service: General;  Laterality: Left;   FOOT SURGERY     IR ANGIO EXTERNAL CAROTID SEL EXT CAROTID BILAT MOD SED  12/01/2021   IR ANGIO INTRA EXTRACRAN SEL COM CAROTID INNOMINATE BILAT MOD SED  12/01/2021   IR ANGIO VERTEBRAL SEL VERTEBRAL UNI R MOD SED  12/01/2021   IR ANGIOGRAM FOLLOW UP STUDY  12/01/2021   IR ANGIOGRAM FOLLOW UP STUDY  12/01/2021   IR ANGIOGRAM FOLLOW UP STUDY  12/01/2021   IR NEURO EACH ADD'L AFTER BASIC UNI LEFT (MS)  12/01/2021   IR NEURO EACH ADD'L AFTER BASIC UNI RIGHT (MS)  12/01/2021   IR NEURO EACH ADD'L AFTER BASIC UNI RIGHT (MS)  12/01/2021   IR TRANSCATH/EMBOLIZ  12/01/2021   IR TRANSCATH/EMBOLIZ  12/01/2021   RADIOLOGY WITH ANESTHESIA N/A 12/01/2021   Procedure: RADIOLOGY WITH ANESTHESIA;  Surgeon: Julieanne Cotton, MD;  Location: [facility];  Service: Radiology;  Laterality: N/A;   TONSILLECTOMY     vein removal      Current Medications: Current Meds  Medication Sig   allopurinol (ZYLOPRIM) 300 MG tablet Take 300 mg by mouth every morning.   B Complex-C (B-COMPLEX WITH VITAMIN C) tablet Take 1 tablet by mouth daily.   Biotin 29562 MCG TABS Take 10,000 mcg by mouth every morning.   carvedilol (COREG) 6.25 MG tablet Take 6.25 mg by mouth 2 (two) times daily.   cholecalciferol (VITAMIN D3) 25 MCG (1000 UT) tablet Take 1,000 Units by mouth every morning.   cloNIDine (CATAPRES - DOSED IN MG/24 HR) 0.1 mg/24hr patch Place 1 patch (0.1 mg total) onto the skin once a week.   Collagen-Vitamin C-Biotin (COLLAGEN PO) Take 1,000 mg by mouth daily.   furosemide (LASIX) 20 MG tablet Take 20 mg by mouth 2 (two) times daily.   Krill Oil 1000 MG CAPS 1 capsule Orally once a day   levothyroxine (SYNTHROID) 100 MCG tablet Take 100 mcg by mouth every evening.   Multiple Vitamins-Minerals (HAIR FORMULA EXTRA STRENGTH PO) 4 tablets Orally once a day   rosuvastatin  (CRESTOR) 20 MG tablet Take 1 tablet (20 mg total) by mouth daily.   tamoxifen (NOLVADEX) 10 MG tablet TAKE ONE TABLET BY MOUTH DAILY (Patient taking differently: Take 10 mg by mouth every evening.)   valsartan (DIOVAN) 320 MG tablet Take 320 mg by mouth daily.   Vitamin E 670 MG (1000 UT) CAPS Take 1,000 Units by mouth every morning.     Allergies:   Chlorthalidone, Accupril [quinapril hcl], Amlodipine besylate, Atacand [candesartan], Atorvastatin, Cozaar [losartan potassium], Hydrochlorothiazide w-triamterene, Tiazac [diltiazem hcl er beads], and Zestril [lisinopril]   Social History   Socioeconomic History   Marital status: Married    Spouse name: Not on file   Number of children: Not on file   Years of education: Not on file   Highest education level: Not on file  Occupational History   Occupation: retired  Tobacco Use   Smoking status: Never   Smokeless tobacco: Never  Vaping Use   Vaping status: Never Used  Substance and Sexual Activity   Alcohol use: Yes    Comment: 2-3x per week  Drug use: No   Sexual activity: Not on file  Other Topics Concern   Not on file  Social History Narrative   Not on file   Social Drivers of Health   Financial Resource Strain: Not on file  Food Insecurity: No Food Insecurity (10/18/2023)   Hunger Vital Sign    Worried About Running Out of Food in the Last Year: Never true    Ran Out of Food in the Last Year: Never true  Transportation Needs: No Transportation Needs (10/18/2023)   PRAPARE - Administrator, Civil Service (Medical): No    Lack of Transportation (Non-Medical): No  Physical Activity: Not on file  Stress: Not on file  Social Connections: Moderately Integrated (10/18/2023)   Social Connection and Isolation Panel [NHANES]    Frequency of Communication with Friends and Family: More than three times a week    Frequency of Social Gatherings with Friends and Family: Twice a week    Attends Religious Services: 1 to 4  times per year    Active Member of [employer] or Organizations: No    Attends Engineer, structural: Never    Marital Status: Married     Family History: The patient's family history includes Heart disease in her brother, father, and mother; Heart failure in her mother; Pancreatic cancer in her half-brother.  ROS:   Please see the history of present illness.    All other systems reviewed and are negative.  EKGs/Labs/Other Studies Reviewed:    EKG:  EKG is ordered today.    Recent Labs: 10/18/2023: ALT 17; TSH 2.177 10/19/2023: Hemoglobin 10.6; Magnesium 2.0; Platelets 281 10/21/2023: BUN 20; Creatinine, Ser 1.28; Potassium 4.6; Sodium 130   Recent Lipid Panel    Component Value Date/Time   CHOL 110 12/17/2022 0952   TRIG 99 12/17/2022 0952   HDL 35 (L) 12/17/2022 0952   CHOLHDL 3.1 12/17/2022 0952   LDLCALC 56 12/17/2022 0952    Physical Exam:   VS:  BP (!) 174/93   Pulse 67   Ht 5\' 8"  (1.727 m)   Wt 221 lb 6.4 oz (100.4 kg)   SpO2 98%   BMI 33.66 kg/m  , BMI Body mass index is 33.66 kg/m. GENERAL:  Well appearing HEENT: Pupils equal round and reactive, fundi not visualized, oral mucosa unremarkable NECK:  No jugular venous distention, waveform within normal limits, carotid upstroke brisk and symmetric, no bruits, no thyromegaly LUNGS:  Clear to auscultation bilaterally HEART:  RRR.  PMI not displaced or sustained,S1 and S2 within normal limits, no S3, no S4, no clicks, no rubs, no murmurs ABD:  Flat, positive bowel sounds normal in frequency in pitch, no bruits, no rebound, no guarding, no midline pulsatile mass, no hepatomegaly, no splenomegaly EXT:  2 plus pulses throughout, no edema, no cyanosis no clubbing SKIN:  No rashes no nodules NEURO:  Cranial nerves II through XII grossly intact, motor grossly intact throughout PSYCH:  Cognitively intact, oriented to person place and time   ASSESSMENT/PLAN:    # Hypertension Difficult to control with significant  variability. Currently on Valsartan and Carvedilol. Previously tried Chlorthalidone, Hydralazine, and Spironolactone. Noted good response to Clonidine but experienced rebound hypertension.  Tamoxifen could be contributing but is necessary. -Add Clonidine 0.1mg  patch weekly. -Continue Valsartan and Carvedilol. -Check blood pressure regularly and record readings. -Follow up in 1 month to assess response to treatment.   # Non-obstructive CAD:  # Hyperlipidemia:  Continue carvedilol and rosuvastatin.  She  has no ischemic symptoms and lipids are controlled.   # Edema Occasional ankle swelling. Currently taking Furosemide as needed. -Continue Furosemide as needed for ankle swelling.  # Breast Cancer On Tamoxifen for treatment. Recent genetic testing indicates need for continued therapy for 2 more years. -Continue Tamoxifen as directed by oncologist.  # Family History of Heart Disease Noted family history of heart attacks, angina, and congestive heart failure. Patient has aortic valve calcification causing a murmur but no obstruction. -Continue aggressive management of hypertension and cholesterol to reduce cardiovascular risk.   #Sedentary Lifestyle Limited physical activity due to joint pain. Has access to a pool and a free membership to [employer]. -Encourage water activities and use of recumbent bike at the gym to increase physical activity without exacerbating joint pain. -Refer to [facility] Prep program for additional support with diet and exercise.  Diet Cooks at home, but may be consuming too much sodium. -Advise to limit added salt in diet.     Screening for Secondary Hypertension:     11/20/2023    3:45 PM  Causes  Drugs/Herbals Screened     - Comments high sodium diet.  limits caffaine.  rare EtOH.  No supplements  Renovascular HTN N/A  Sleep Apnea Screened     - Comments no symptoms  Thyroid Disease Screened  Hyperaldosteronism N/A  Pheochromocytoma N/A  Cushing's  Syndrome N/A  Hyperparathyroidism N/A  Coarctation of the Aorta Not Screened     - Comments unable to check bilateral BP  Compliance Screened    Relevant Labs/Studies:    Latest Ref Rng & Units 10/21/2023    5:09 AM 10/20/2023    3:06 PM 10/20/2023    9:34 AM  Basic Labs  Sodium 135 - 145 mmol/L 130  129  128   Potassium 3.5 - 5.1 mmol/L 4.6   3.8   Creatinine 0.44 - 1.00 mg/dL 9.14   7.82        Latest Ref Rng & Units 10/18/2023    2:30 PM 11/08/2022    3:36 PM  Thyroid   TSH 0.350 - 4.500 uIU/mL 2.177  3.180      Disposition:    FU with MD/PharmD in 1 month    Medication Adjustments/Labs and Tests Ordered: Current medicines are reviewed at length with the patient today.  Concerns regarding medicines are outlined above.  Orders Placed This Encounter  Procedures   Amb Referral To Provider Referral Exercise Program (P.R.E.P)   Meds ordered this encounter  Medications   cloNIDine (CATAPRES - DOSED IN MG/24 HR) 0.1 mg/24hr patch    Sig: Place 1 patch (0.1 mg total) onto the skin once a week.    Dispense:  4 patch    Refill:  12     Signed, Chilton Si, MD  11/20/2023 5:26 PM    [facility]

## 2023-11-22 ENCOUNTER — Telehealth: Payer: Self-pay

## 2023-11-22 NOTE — Telephone Encounter (Signed)
Called [facility] program referral, she would like to attend next class at [facility] on Feb 25, every T/Th 10-11:15; assessment visit scheduled for 2/24 at [facility]

## 2023-12-02 NOTE — Progress Notes (Signed)
 [facility] Evaluation  Patient Details  Name: Karen Mills MRN: [id number] Date of Birth: 1956/06/21 Age: 68 y.o. PCP: Daisy Floro, MD  Vitals:   12/02/23 0914  BP: (!) 180/84  Pulse: 79  SpO2: 98%  Weight: 224 lb 9.6 oz (101.9 kg)     [facility] Eval - 12/02/23 0900       [facility] "PREP" Location   [facility]PREP[facility] Location [facility]      Referral    Referring Provider Duke Salvia    Reason for referral Hypertension;High Cholesterol;Inactivity;Obesitity/Overweight;Cancer    Program Start Date 12/03/23      Measurement   Waist Circumference 44 inches    Hip Circumference 52.5 inches    Body fat 46.3 percent      Information for Trainer   Goals --   Better BP control; establish exercise routine; lose 10-15 pounds by end of program   Current Exercise none    Orthopedic Concerns --   OA bips and knees   Pertinent Medical History --   HTN, hyperlipidemia, L breast CA, CKD   Medications that affect exercise Beta blocker      Timed Up and Go (TUGS)   Timed Up and Go Low risk <9 seconds      Mobility and Daily Activities   I find it easy to walk up or down two or more flights of stairs. 1    I have no trouble taking out the trash. 4    I do housework such as vacuuming and dusting on my own without difficulty. 4    I can easily lift a gallon of milk (8lbs). 4    I can easily walk a mile. 2    I have no trouble reaching into high cupboards or reaching down to pick up something from the floor. 4    I do not have trouble doing out-door work such as Loss adjuster, chartered, raking leaves, or gardening. 4      Mobility and Daily Activities   I feel younger than my age. 2    I feel independent. 4    I feel energetic. 1    I live an active life.  1    I feel strong. 2    I feel healthy. 2    I feel active as other people my age. 1      How fit and strong are you.   Fit and Strong Total Score 36            Past Medical History:  Diagnosis Date   Breast cancer (HCC) 2020    Left Breast Cancer   CKD (chronic kidney disease)    Gout    Hyperlipidemia    Hypertension    Hypothyroidism    Megaloblastic anemia    Obesity    Personal history of radiation therapy 2020   Left Breast Cancer   PONV (postoperative nausea and vomiting)    SCC (squamous cell carcinoma) Keratoacanthoma 10/10/2016   Right Shin (Cx3,5FU)   Squamous cell carcinoma in situ (SCCIS) 11/15/2016   Right Lower Shin (tx p bx)   Varicose veins    Past Surgical History:  Procedure Laterality Date   ABLATION ON ENDOMETRIOSIS     BREAST LUMPECTOMY Left 04/30/2019   BREAST LUMPECTOMY WITH RADIOACTIVE SEED AND SENTINEL LYMPH NODE BIOPSY Left 04/30/2019   Procedure: LEFT BREAST LUMPECTOMY WITH RADIOACTIVE SEED AND SENTINEL LYMPH NODE BIOPSY;  Surgeon: Griselda Miner, MD;  Location: [facility];  Service: General;  Laterality: Left;   FOOT SURGERY     IR ANGIO EXTERNAL CAROTID SEL EXT CAROTID BILAT MOD SED  12/01/2021   IR ANGIO INTRA EXTRACRAN SEL COM CAROTID INNOMINATE BILAT MOD SED  12/01/2021   IR ANGIO VERTEBRAL SEL VERTEBRAL UNI R MOD SED  12/01/2021   IR ANGIOGRAM FOLLOW UP STUDY  12/01/2021   IR ANGIOGRAM FOLLOW UP STUDY  12/01/2021   IR ANGIOGRAM FOLLOW UP STUDY  12/01/2021   IR NEURO EACH ADD'L AFTER BASIC UNI LEFT (MS)  12/01/2021   IR NEURO EACH ADD'L AFTER BASIC UNI RIGHT (MS)  12/01/2021   IR NEURO EACH ADD'L AFTER BASIC UNI RIGHT (MS)  12/01/2021   IR TRANSCATH/EMBOLIZ  12/01/2021   IR TRANSCATH/EMBOLIZ  12/01/2021   RADIOLOGY WITH ANESTHESIA N/A 12/01/2021   Procedure: RADIOLOGY WITH ANESTHESIA;  Surgeon: Julieanne Cotton, MD;  Location: [facility];  Service: Radiology;  Laterality: N/A;   TONSILLECTOMY     vein removal     Social History   Tobacco Use  Smoking Status Never  Smokeless Tobacco Never  To begin PREP program at [facility] on 2/25, every T/Th  07-1114  Dolan Xia B Elmire Amrein 12/02/2023, 9:17 AM

## 2023-12-03 NOTE — Progress Notes (Signed)
 [facility] PRE[facility] Weekly Session  Patient Details  Name: Karen Mills MRN: [id number] Date of Birth: [birth year] Age: 68 y.o. PCP: Daisy Floro, MD  There were no vitals filed for this visit.   [facility] Weekly seesion - 12/03/23 1300       [facility] "PREP" Location   [facility] "PREP[facility] Location [facility]      Weekly Session   Topic Discussed Goal setting and welcome to the program   Introductions, review of notebook, tour of facility; offered option for short warm up on cardio machine   Classes attended to date 1             Karen Mills 12/03/2023, 1:36 PM

## 2023-12-06 ENCOUNTER — Encounter (HOSPITAL_BASED_OUTPATIENT_CLINIC_OR_DEPARTMENT_OTHER): Payer: Self-pay | Admitting: Cardiovascular Disease

## 2023-12-06 NOTE — Telephone Encounter (Signed)
 Please review BP and advise

## 2023-12-09 ENCOUNTER — Inpatient Hospital Stay: Payer: Medicare Other | Attending: Hematology and Oncology

## 2023-12-09 DIAGNOSIS — Z1721 Progesterone receptor positive status: Secondary | ICD-10-CM | POA: Diagnosis not present

## 2023-12-09 DIAGNOSIS — N182 Chronic kidney disease, stage 2 (mild): Secondary | ICD-10-CM

## 2023-12-09 DIAGNOSIS — D649 Anemia, unspecified: Secondary | ICD-10-CM

## 2023-12-09 DIAGNOSIS — Z170 Estrogen receptor positive status [ER+]: Secondary | ICD-10-CM | POA: Insufficient documentation

## 2023-12-09 DIAGNOSIS — C50512 Malignant neoplasm of lower-outer quadrant of left female breast: Secondary | ICD-10-CM | POA: Insufficient documentation

## 2023-12-09 LAB — CMP (CANCER CENTER ONLY)
ALT: 17 U/L (ref 0–44)
AST: 22 U/L (ref 15–41)
Albumin: 3.8 g/dL (ref 3.5–5.0)
Alkaline Phosphatase: 74 U/L (ref 38–126)
Anion gap: 6 (ref 5–15)
BUN: 18 mg/dL (ref 8–23)
CO2: 26 mmol/L (ref 22–32)
Calcium: 9.2 mg/dL (ref 8.9–10.3)
Chloride: 101 mmol/L (ref 98–111)
Creatinine: 1.07 mg/dL — ABNORMAL HIGH (ref 0.44–1.00)
GFR, Estimated: 57 mL/min — ABNORMAL LOW (ref >60)
Glucose, Bld: 109 mg/dL — ABNORMAL HIGH (ref 70–99)
Potassium: 4.1 mmol/L (ref 3.5–5.1)
Sodium: 133 mmol/L — ABNORMAL LOW (ref 135–145)
Total Bilirubin: 0.7 mg/dL (ref 0.0–1.2)
Total Protein: 6.3 g/dL — ABNORMAL LOW (ref 6.5–8.1)

## 2023-12-09 LAB — CBC WITH DIFFERENTIAL (CANCER CENTER ONLY)
Abs Immature Granulocytes: 0.02 10*3/uL (ref 0.00–0.07)
Basophils Absolute: 0 10*3/uL (ref 0.0–0.1)
Basophils Relative: 0 %
Eosinophils Absolute: 0.2 10*3/uL (ref 0.0–0.5)
Eosinophils Relative: 5 %
HCT: 31.5 % — ABNORMAL LOW (ref 36.0–46.0)
Hemoglobin: 10.8 g/dL — ABNORMAL LOW (ref 12.0–15.0)
Immature Granulocytes: 0 %
Lymphocytes Relative: 23 %
Lymphs Abs: 1.1 10*3/uL (ref 0.7–4.0)
MCH: 35 pg — ABNORMAL HIGH (ref 26.0–34.0)
MCHC: 34.3 g/dL (ref 30.0–36.0)
MCV: 101.9 fL — ABNORMAL HIGH (ref 80.0–100.0)
Monocytes Absolute: 0.5 10*3/uL (ref 0.1–1.0)
Monocytes Relative: 10 %
Neutro Abs: 3 10*3/uL (ref 1.7–7.7)
Neutrophils Relative %: 62 %
Platelet Count: 287 10*3/uL (ref 150–400)
RBC: 3.09 MIL/uL — ABNORMAL LOW (ref 3.87–5.11)
RDW: 13.1 % (ref 11.5–15.5)
WBC Count: 4.9 10*3/uL (ref 4.0–10.5)
nRBC: 0 % (ref 0.0–0.2)

## 2023-12-10 MED ORDER — CLONIDINE 0.2 MG/24HR TD PTWK: 0.2000 mg | MEDICATED_PATCH | TRANSDERMAL | 3 refills | Status: DC

## 2023-12-10 MED ORDER — CARVEDILOL 12.5 MG PO TABS: 12.5000 mg | ORAL_TABLET | Freq: Two times a day (BID) | ORAL | 3 refills | Status: AC

## 2023-12-10 NOTE — Progress Notes (Signed)
 [facility] PRE[facility] Weekly Session  Patient Details  Name: Karen Mills MRN: [id number] Date of Birth: 04-Feb-1956 Age: 68 y.o. PCP: Daisy Floro, MD  There were no vitals filed for this visit.   [facility] Weekly seesion - 12/10/23 1200       [facility] "PREP" Location   [facility] "PREP[facility] Location [facility]      Weekly Session   Topic Discussed Importance of resistance training;Other ways to be active   Goal by the end of the program: 150 min/wk of cardio; strength training 2-3 times/wk for 20-40 minutes   Classes attended to date 3             Leshea Jaggers B Dasja Brase 12/10/2023, 12:26 PM

## 2023-12-10 NOTE — Telephone Encounter (Signed)
 Printed also

## 2023-12-12 DIAGNOSIS — E871 Hypo-osmolality and hyponatremia: Secondary | ICD-10-CM | POA: Diagnosis not present

## 2023-12-12 DIAGNOSIS — R768 Other specified abnormal immunological findings in serum: Secondary | ICD-10-CM | POA: Diagnosis not present

## 2023-12-12 DIAGNOSIS — N1831 Chronic kidney disease, stage 3a: Secondary | ICD-10-CM | POA: Diagnosis not present

## 2023-12-12 DIAGNOSIS — I129 Hypertensive chronic kidney disease with stage 1 through stage 4 chronic kidney disease, or unspecified chronic kidney disease: Secondary | ICD-10-CM | POA: Diagnosis not present

## 2023-12-12 DIAGNOSIS — R809 Proteinuria, unspecified: Secondary | ICD-10-CM | POA: Diagnosis not present

## 2023-12-16 LAB — LAB REPORT - SCANNED: Creatinine, POC: 20.8 mg/dL

## 2023-12-17 NOTE — Progress Notes (Signed)
 [facility] PRE[facility] Weekly Session  Patient Details  Name: Karen Mills MRN: [id number] Date of Birth: 1956/09/03 Age: 68 y.o. PCP: Daisy Floro, MD  Vitals:   12/17/23 1140  Weight: 227 lb (103 kg)     [facility] Weekly seesion - 12/17/23 1100       [facility] "PREP" Location   [facility] "PREP[facility] Location [facility]      Weekly Session   Topic Discussed Healthy eating tips   Foods to decrease, foods to increase; eat the rainbow of colors; introduced to [employer]   Minutes exercised this week 140 minutes    Classes attended to date 4             Laruth Hanger B Jhaniya Briski 12/17/2023, 11:40 AM

## 2023-12-23 DIAGNOSIS — M7061 Trochanteric bursitis, right hip: Secondary | ICD-10-CM | POA: Diagnosis not present

## 2023-12-23 DIAGNOSIS — M1711 Unilateral primary osteoarthritis, right knee: Secondary | ICD-10-CM | POA: Diagnosis not present

## 2023-12-24 NOTE — Progress Notes (Signed)
 [facility] PRE[facility] Weekly Session  Patient Details  Name: Karen Mills MRN: [id number] Date of Birth: 11-30-1955 Age: 68 y.o. PCP: Daisy Floro, MD  There were no vitals filed for this visit.   [facility] Weekly seesion - 12/24/23 1100       [facility] "PREP" Location   [facility] "PREP[facility] Location [facility]      Weekly Session   Topic Discussed Health habits   Sugar Demo   Minutes exercised this week 100 minutes    Classes attended to date 52             Karen Mills B Casey Maxfield 12/24/2023, 11:37 AM

## 2023-12-31 NOTE — Progress Notes (Signed)
 [facility] PRE[facility] Weekly Session  Patient Details  Name: Karen Mills MRN: [id number] Date of Birth: 1955-11-17 Age: 68 y.o. PCP: Daisy Floro, MD  Vitals:   12/31/23 1137  Weight: 221 lb 9.6 oz (100.5 kg)     [facility] Weekly seesion - 12/31/23 1100       [facility] "PREP" Location   [facility] "PREP[facility] Location [facility]      Weekly Session   Topic Discussed Restaurant Eating   Salt demo; review of low sodium flavoring tips   Minutes exercised this week 90 minutes    Classes attended to date 8             [name] 12/31/2023, 11:38 AM

## 2024-01-01 ENCOUNTER — Ambulatory Visit (HOSPITAL_BASED_OUTPATIENT_CLINIC_OR_DEPARTMENT_OTHER): Payer: Medicare Other | Admitting: Pharmacist Clinician (PhC)/ Clinical Pharmacy Specialist

## 2024-01-01 ENCOUNTER — Encounter (HOSPITAL_BASED_OUTPATIENT_CLINIC_OR_DEPARTMENT_OTHER): Payer: Self-pay | Admitting: Pharmacist Clinician (PhC)/ Clinical Pharmacy Specialist

## 2024-01-01 VITALS — BP 153/84 | HR 58

## 2024-01-01 DIAGNOSIS — I10 Essential (primary) hypertension: Secondary | ICD-10-CM | POA: Diagnosis not present

## 2024-01-01 MED ORDER — NIFEDIPINE ER OSMOTIC RELEASE 30 MG PO TB24
ORAL_TABLET | ORAL | 3 refills | Status: DC
Start: 2024-01-01 — End: 2024-04-23

## 2024-01-01 NOTE — Patient Instructions (Addendum)
 Follow up appointment: April 14  Take your BP meds as follows:   START NIFEDIPINE 30 MG ONCE DAILY IN THE MORNINGS  CONTINUE WITH YOUR OTHER MEDICATIONS.    OKAY TO USE EXTRA 6.25 MG OF CARVEDILOL FOR THE NEXT WEEK, THEN STOP FOR NOW  Check your blood pressure at home daily (if able) and keep record of the readings.  Your blood pressure goal is < 160/80  To check your pressure at home you will need to:  1. Sit up in a chair, with feet flat on the floor and back supported. Do not cross your ankles or legs. 2. Rest your left arm so that the cuff is about heart level. If the cuff goes on your upper arm,  then just relax the arm on the table, arm of the chair or your lap. If you have a wrist cuff, we  suggest relaxing your wrist against your chest (think of it as Pledging the Flag with the  wrong arm).  3. Place the cuff snugly around your arm, about 1 inch above the crook of your elbow. The  cords should be inside the groove of your elbow.  4. Sit quietly, with the cuff in place, for about 5 minutes. After that 5 minutes press the power  button to start a reading. 5. Do not talk or move while the reading is taking place.  6. Record your readings on a sheet of paper. Although most cuffs have a memory, it is often  easier to see a pattern developing when the numbers are all in front of you.  7. You can repeat the reading after 1-3 minutes if it is recommended  Make sure your bladder is empty and you have not had caffeine or tobacco within the last 30 min  Always bring your blood pressure log with you to your appointments. If you have not brought your monitor in to be double checked for accuracy, please bring it to your next appointment.  You can find a list of quality blood pressure cuffs at [email or web address]  Important lifestyle changes to control high blood pressure  Intervention  Effect on the BP  Lose extra pounds and watch your waistline Weight loss is one of the most effective  lifestyle changes for controlling blood pressure. If you're overweight or obese, losing even a small amount of weight can help reduce blood pressure. Blood pressure might go down by about 1 millimeter of mercury (mm Hg) with each kilogram (about 2.2 pounds) of weight lost.  Exercise regularly As a general goal, aim for at least 30 minutes of moderate physical activity every day. Regular physical activity can lower high blood pressure by about 5 to 8 mm Hg.  Eat a healthy diet Eating a diet rich in whole grains, fruits, vegetables, and low-fat dairy products and low in saturated fat and cholesterol. A healthy diet can lower high blood pressure by up to 11 mm Hg.  Reduce salt (sodium) in your diet Even a small reduction of sodium in the diet can improve heart health and reduce high blood pressure by about 5 to 6 mm Hg.  Limit alcohol One drink equals 12 ounces of beer, 5 ounces of wine, or 1.5 ounces of 80-proof liquor.  Limiting alcohol to less than one drink a day for women or two drinks a day for men can help lower blood pressure by about 4 mm Hg.   If you have any questions or concerns please use My Chart to send questions  or call the office at [phone]

## 2024-01-01 NOTE — Assessment & Plan Note (Signed)
 Assessment: BP is uncontrolled in office BP 153/84 mmHg;  above the goal (<130/80). Has multiple intolerances as well as chronic hyponatremia, limiting options for [place] Tolerates valsartan, carvedilol and clonidine patches well, without any side effects Denies SOB, palpitation, chest pain, headaches,or swelling Reiterated the importance of regular exercise and low salt diet   Plan:  Start taking nifedipine xl 30 mg once daily in the mornings Continue taking carvedilol 12.5 mg twice daily, valsartan 320 mg each night and clonidine patch 0.2 mg each Tuesday.  You may take an extra 6.25 mg of carvedilol in the night if needed, but just for another week. Patient to keep record of BP readings with heart rate and report to [place] at the next visit Patient to follow up with Belenda Cruise in 3 weeks  Labs ordered today:  none

## 2024-01-01 NOTE — Progress Notes (Signed)
 Office Visit    Patient Name: Karen Mills Date of Encounter: 01/01/2024  Primary Care Provider:  Daisy Floro, MD Primary Cardiologist:  Chilton Si, MD  Chief Complaint    Hypertension - Advanced hypertension clinic  Past Medical History   CAD 2023 CAC 342 (92 nd percentile)  HLD 3/24 LDL 56 on rosuvastatin 20  CKD IIIa, followed by Dr. Thedore Mins at nephrology  Breast cancer On tamoxifen and epoetin - both can raise BP    Allergies  Allergen Reactions   Chlorthalidone Other (See Comments)    Severe Hyponatremia   Accupril [Quinapril Hcl] Cough   Amlodipine Besylate Swelling    Leg swelling   Atacand [Candesartan] Other (See Comments)    Unknown reaction   Atorvastatin Other (See Comments)    Joint pain   Cozaar [Losartan Potassium] Other (See Comments)    Stomach upset    Hydrochlorothiazide W-Triamterene Other (See Comments)    Unknown reaction  Other Reaction(s): not helpful   Tiazac [Diltiazem Hcl Er Beads] Other (See Comments)    Unknown reaction   Zestril [Lisinopril] Cough    History of Present Illness    Karen Mills is a 68 y.o. female patient who was referred to the [facility] by Anthony Sar MD.  When she saw Dr. Duke Salvia in February her BP was noted to be 174/93.  She added clonidine 0.1 mg patch.  Patient then called into the office 2 weeks later to report home readings still elevated (180-190's/90-100's).  The clonidine was increased to 0.2 mg and carvedilol to 12.5 mg.    Currently she takes tamoxifen, which could potentially be causing an increase in BP.  She started this 5 years ago and was told by oncology that she would take for 5-7 years.  She also has used epoetin when her hemoglobin is low, but notes she has not had a dose for several months. She admits that with her BP so high she has taken the occasional extra 6.25 mg of carvedilol in the middle of the night.  On most of those occasions her morning pressure was  ~20 points lower systolic.    Blood Pressure Goal:  130/80  Current Medications: carvedilol 12.5 mg bid, clonidine 0.2 mg patch, furosemide 20 mg bid, valsartan 320 mg every day  (pm)   Previously tried:   chlorthalidone - hyponatremia  Amlodipine - lee  ACEI - cough  Candesartan - unknown  Losartan - upset stomach  HCTZ/triamterene - not helpful  Diltiazem - unknown  Hydralazine - stopped by nephrology  Spironolactone - unknown, says nephrology was considering re-trial  Family Hx:   both parents had hypertension; mother had MI in her late 8's, CHF later; father had CABG in 60's, died from complications; brother (58 yrs younger) has CABG in his late 81's; daughter healthy  Social Hx:      Tobacco: no  Alcohol: occasionally  Caffeine:  8 oz Coke in the morning, otherwise water all day, occasional un-sweet tea  Diet:  mostly home cooked meals , more breakfast and lunch - oatmeal with walnuts, or egg with english muffin; lunch salads, sandwiches, dinner is more finger food - nuts, grapes, cheeses, berries  Exercise: limited by knee and hip pain, has gym membership but unsure what exercises would not exacerbate joint pain  Home BP readings: checked twice daily - looking at past 3 weeks  AM 21 readings average 181/92  HR 57  PM 22 readings average  162/81  HR 59   Accessory Clinical Findings    Lab Results  Component Value Date   CREATININE 1.07 (H) 12/09/2023   BUN 18 12/09/2023   NA 133 (L) 12/09/2023   K 4.1 12/09/2023   CL 101 12/09/2023   CO2 26 12/09/2023   Lab Results  Component Value Date   ALT 17 12/09/2023   AST 22 12/09/2023   ALKPHOS 74 12/09/2023   BILITOT 0.7 12/09/2023   No results found for: "HGBA1C"  Screening for Secondary Hypertension:      11/20/2023    3:45 PM  Causes  Drugs/Herbals Screened     - Comments high sodium diet.  limits caffaine.  rare EtOH.  No supplements  Renovascular HTN N/A  Sleep Apnea Screened     - Comments no symptoms   Thyroid Disease Screened  Hyperaldosteronism N/A  Pheochromocytoma N/A  Cushing's Syndrome N/A  Hyperparathyroidism N/A  Coarctation of the Aorta Not Screened     - Comments unable to check bilateral BP  Compliance Screened    Relevant Labs/Studies:    Latest Ref Rng & Units 12/09/2023    8:40 AM 10/21/2023    5:09 AM 10/20/2023    3:06 PM  Basic Labs  Sodium 135 - 145 mmol/L 133  130  129   Potassium 3.5 - 5.1 mmol/L 4.1  4.6    Creatinine 0.44 - 1.00 mg/dL 1.19  1.47         Latest Ref Rng & Units 10/18/2023    2:30 PM 11/08/2022    3:36 PM  Thyroid   TSH 0.350 - 4.500 uIU/mL 2.177  3.180                 Home Medications    Current Outpatient Medications  Medication Sig Dispense Refill   NIFEdipine (PROCARDIA-XL/NIFEDICAL-XL) 30 MG 24 hr tablet Take 1 tablet by mouth each morning. 30 tablet 3   allopurinol (ZYLOPRIM) 300 MG tablet Take 300 mg by mouth every morning.     B Complex-C (B-COMPLEX WITH VITAMIN C) tablet Take 1 tablet by mouth daily.     Biotin 82956 MCG TABS Take 10,000 mcg by mouth every morning.     carvedilol (COREG) 12.5 MG tablet Take 1 tablet (12.5 mg total) by mouth 2 (two) times daily. 180 tablet 3   cholecalciferol (VITAMIN D3) 25 MCG (1000 UT) tablet Take 1,000 Units by mouth every morning.     cloNIDine (CATAPRES - DOSED IN MG/24 HR) 0.2 mg/24hr patch Place 1 patch (0.2 mg total) onto the skin once a week. 12 patch 3   Collagen-Vitamin C-Biotin (COLLAGEN PO) Take 1,000 mg by mouth daily.     furosemide (LASIX) 20 MG tablet Take 20 mg by mouth 2 (two) times daily.     Krill Oil 1000 MG CAPS 1 capsule Orally once a day     levothyroxine (SYNTHROID) 100 MCG tablet Take 100 mcg by mouth every evening.     Multiple Vitamins-Minerals (HAIR FORMULA EXTRA STRENGTH PO) 4 tablets Orally once a day     rosuvastatin (CRESTOR) 20 MG tablet Take 1 tablet (20 mg total) by mouth daily. 90 tablet 3   tamoxifen (NOLVADEX) 10 MG tablet TAKE ONE TABLET BY MOUTH DAILY  (Patient taking differently: Take 10 mg by mouth every evening.) 90 tablet 3   valsartan (DIOVAN) 320 MG tablet Take 320 mg by mouth daily.     Vitamin E 670 MG (1000 UT) CAPS Take 1,000 Units by mouth every  morning.     No current facility-administered medications for this visit.     Assessment & Plan   HYPERTENSION CONTROL Vitals:   01/01/24 0908 01/01/24 0914  BP: (!) 150/82 (!) 153/84    The patient's blood pressure is elevated above target today.  In order to address the patient's elevated BP: A new medication was prescribed today.     Hypertension Assessment: BP is uncontrolled in office BP 153/84 mmHg;  above the goal (<130/80). Has multiple intolerances as well as chronic hyponatremia, limiting options for [place] Tolerates valsartan, carvedilol and clonidine patches well, without any side effects Denies SOB, palpitation, chest pain, headaches,or swelling Reiterated the importance of regular exercise and low salt diet   Plan:  Start taking nifedipine xl 30 mg once daily in the mornings Continue taking carvedilol 12.5 mg twice daily, valsartan 320 mg each night and clonidine patch 0.2 mg each Tuesday.  You may take an extra 6.25 mg of carvedilol in the night if needed, but just for another week. Patient to keep record of BP readings with heart rate and report to [place] at the next visit Patient to follow up with Belenda Cruise in 3 weeks  Labs ordered today:  none   Phillips Hay PharmD CPP [facility]  [address] [place], [place] [address] [phone]

## 2024-01-07 NOTE — Progress Notes (Signed)
 [facility] PRE[facility] Weekly Session  Patient Details  Name: Karen Mills MRN: [id number] Date of Birth: 02/11/1956 Age: 68 y.o. PCP: Daisy Floro, MD  Vitals:   01/07/24 1117  Weight: 222 lb (100.7 kg)     [facility] Weekly seesion - 01/07/24 1100       [facility] "PREP" Location   [facility]PREP[facility] Location [facility]      Weekly Session   Topic Discussed Stress management and problem solving   importance of 7-9 hrs/night of sleep; finger tip mudra breathwork; sleep guidelines   Minutes exercised this week 100 minutes    Classes attended to date 10             Chandi Nicklin B Gigi Onstad 01/07/2024, 11:17 AM

## 2024-01-14 NOTE — Progress Notes (Signed)
 [facility] PRE[facility] Weekly Session  Patient Details  Name: Karen Mills MRN: [id number] Date of Birth: 1955/10/19 Age: 68 y.o. PCP: Daisy Floro, MD  Vitals:   01/14/24 1116  Weight: 225 lb (102.1 kg)     [facility] Weekly seesion - 01/14/24 1100       [facility] "PREP" Location   [facility]PREP[facility] Location [facility]      Weekly Session   Topic Discussed Expectations and non-scale victories   Halfway thru program, review/revisit/restate goals; staying positive   Minutes exercised this week 100 minutes    Classes attended to date 76             Karen Mills 01/14/2024, 11:18 AM

## 2024-01-20 ENCOUNTER — Encounter (HOSPITAL_BASED_OUTPATIENT_CLINIC_OR_DEPARTMENT_OTHER): Payer: Self-pay | Admitting: Pharmacist Clinician (PhC)/ Clinical Pharmacy Specialist

## 2024-01-20 ENCOUNTER — Ambulatory Visit (HOSPITAL_BASED_OUTPATIENT_CLINIC_OR_DEPARTMENT_OTHER): Payer: Medicare Other | Admitting: Pharmacist Clinician (PhC)/ Clinical Pharmacy Specialist

## 2024-01-20 VITALS — BP 99/62 | HR 62

## 2024-01-20 DIAGNOSIS — I10 Essential (primary) hypertension: Secondary | ICD-10-CM

## 2024-01-20 NOTE — Patient Instructions (Addendum)
 Follow up appointment: May 23 at 8 am  Take your BP meds as follows:  Continue with your current medications.  Check your blood pressure at home at least 3 days per week and keep record of the readings.  Your blood pressure goal is < 130/80  To check your pressure at home you will need to:  1. Sit up in a chair, with feet flat on the floor and back supported. Do not cross your ankles or legs. 2. Rest your left arm so that the cuff is about heart level. If the cuff goes on your upper arm,  then just relax the arm on the table, arm of the chair or your lap. If you have a wrist cuff, we  suggest relaxing your wrist against your chest (think of it as Pledging the Flag with the  wrong arm).  3. Place the cuff snugly around your arm, about 1 inch above the crook of your elbow. The  cords should be inside the groove of your elbow.  4. Sit quietly, with the cuff in place, for about 5 minutes. After that 5 minutes press the power  button to start a reading. 5. Do not talk or move while the reading is taking place.  6. Record your readings on a sheet of paper. Although most cuffs have a memory, it is often  easier to see a pattern developing when the numbers are all in front of you.  7. You can repeat the reading after 1-3 minutes if it is recommended  Make sure your bladder is empty and you have not had caffeine or tobacco within the last 30 min  Always bring your blood pressure log with you to your appointments. If you have not brought your monitor in to be double checked for accuracy, please bring it to your next appointment.  You can find a list of quality blood pressure cuffs at [email or web address]  Important lifestyle changes to control high blood pressure  Intervention  Effect on the BP  Lose extra pounds and watch your waistline Weight loss is one of the most effective lifestyle changes for controlling blood pressure. If you're overweight or obese, losing even a small amount of weight can  help reduce blood pressure. Blood pressure might go down by about 1 millimeter of mercury (mm Hg) with each kilogram (about 2.2 pounds) of weight lost.  Exercise regularly As a general goal, aim for at least 30 minutes of moderate physical activity every day. Regular physical activity can lower high blood pressure by about 5 to 8 mm Hg.  Eat a healthy diet Eating a diet rich in whole grains, fruits, vegetables, and low-fat dairy products and low in saturated fat and cholesterol. A healthy diet can lower high blood pressure by up to 11 mm Hg.  Reduce salt (sodium) in your diet Even a small reduction of sodium in the diet can improve heart health and reduce high blood pressure by about 5 to 6 mm Hg.  Limit alcohol One drink equals 12 ounces of beer, 5 ounces of wine, or 1.5 ounces of 80-proof liquor.  Limiting alcohol to less than one drink a day for women or two drinks a day for men can help lower blood pressure by about 4 mm Hg.   If you have any questions or concerns please use My Chart to send questions or call the office at [phone]

## 2024-01-20 NOTE — Assessment & Plan Note (Signed)
 Assessment: BP is controlled in office BP 99/62 mmHg;  above the goal (<130/80). Home readings average 143/78 in am and 132/68 in evenings Tolerates carvedilol 12.5 mg bid, clonidine 0.2 mg patch tue hs, furosemide 20 mg bid, valsartan 320 mg every day  (pm) ,nifedipine xl 30 mg qam well without any side effects Denies SOB, palpitation, chest pain, headaches,or swelling No change in LEE (has been on furosemide for some time) since adding in nifedipine Reiterated the importance of regular exercise and low salt diet   Plan:  Continue taking carvedilol 12.5 mg bid, clonidine 0.2 mg patch tue hs, furosemide 20 mg bid, valsartan 320 mg every day  (pm) ,nifedipine xl 30 mg qam Patient to keep record of BP readings with heart rate and report to us  at the next visit Patient to follow up with me in May  Labs ordered today:  none

## 2024-01-20 NOTE — Progress Notes (Signed)
 Office Visit    Patient Name: Karen Mills Date of Encounter: 01/20/2024  Primary Care Provider:  Daisy Floro, MD Primary Cardiologist:  Chilton Si, MD  Chief Complaint    Hypertension - Advanced hypertension clinic  Past Medical History   CAD 2023 CAC 342 (92 nd percentile)  HLD 3/24 LDL 56 on rosuvastatin 20  CKD IIIa, followed by Dr. Thedore Mins at nephrology  Breast cancer On tamoxifen and epoetin - both can raise BP    Allergies  Allergen Reactions   Chlorthalidone Other (See Comments)    Severe Hyponatremia   Accupril [Quinapril Hcl] Cough   Amlodipine Besylate Swelling    Leg swelling   Atacand [Candesartan] Other (See Comments)    Unknown reaction   Atorvastatin Other (See Comments)    Joint pain   Cozaar [Losartan Potassium] Other (See Comments)    Stomach upset    Hydrochlorothiazide-Triamterene Other (See Comments)    Unknown reaction  Other Reaction(s): not helpful   Tiazac [Diltiazem Hcl Er Beads] Other (See Comments)    Unknown reaction   Zestril [Lisinopril] Cough    History of Present Illness    Karen TIFFAY Mills is a 68 y.o. female patient who was referred to the Advanced Hypertension Clinic by Anthony Sar MD.  When she saw Dr. Duke Salvia in February her BP was noted to be 174/93.  She added clonidine 0.1 mg patch.  Patient then called into the office 2 weeks later to report home readings still elevated (180-190's/90-100's).  The clonidine was increased to 0.2 mg and carvedilol to 12.5 mg.  At her last visit we noted she takes tamoxifen, which could potentially be causing an increase in BP.  She started this 5 years ago and was told by oncology that she would take for 5-7 years.  She also has used epoetin when her hemoglobin is low, but noted she has not had a dose for several months. Also noted that with her BP so high she has taken the occasional extra 6.25 mg of carvedilol in the middle of the night.  On most of those occasions her morning  pressure was ~20 points lower systolic.  When I saw her last month, pressure was still elevated at 153/84 and options were somewhat limited.  I started her on nifedipine xl 30 mg daily.  Today she returns for follow up.  She is doing well today and has not noticed any increased edema in her lower extremities.  She does have a baseline edema that is treated with furosemide.  Her blood pressures have dropped significantly with the addition of nifedipine, with home averages dropping 30-40 points systolic and 10-12 points diastolic.     Blood Pressure Goal:  130/80  Current Medications: carvedilol 12.5 mg bid, clonidine 0.2 mg patch tue hs, furosemide 20 mg bid, valsartan 320 mg every day  (pm) ,nifedipine xl 30 mg qam   Previously tried:   chlorthalidone - hyponatremia  Amlodipine - lee  ACEI - cough  Candesartan - unknown  Losartan - upset stomach  HCTZ/triamterene - not helpful  Diltiazem - unknown  Hydralazine - stopped by nephrology  Spironolactone - unknown, says nephrology was considering re-trial  Family Hx:   both parents had hypertension; mother had MI in her late 86's, CHF later; father had CABG in 22's, died from complications; brother (58 yrs younger) has CABG in his late 17's; daughter healthy  Social Hx:      Tobacco: no  Alcohol: occasionally  Caffeine:  8 oz Coke in the morning, otherwise water all day, occasional un-sweet tea  Diet:  mostly home cooked meals , more breakfast and lunch - oatmeal with walnuts, or egg with english muffin; lunch salads, sandwiches, dinner is more finger food - nuts, grapes, cheeses, berries  Exercise: limited by knee and hip pain, but now doing PREP class  Home BP readings: checked twice daily, home Omron cuff, read 12/8 points higher than office reading.   AM 12 readings average 143/78 HR 60 - Previous average 181/92    PM 9 readings average 132/68 HR 62 - Previous average 162/81   Accessory Clinical Findings    Lab Results   Component Value Date   CREATININE 1.07 (H) 12/09/2023   BUN 18 12/09/2023   NA 133 (L) 12/09/2023   K 4.1 12/09/2023   CL 101 12/09/2023   CO2 26 12/09/2023   Lab Results  Component Value Date   ALT 17 12/09/2023   AST 22 12/09/2023   ALKPHOS 74 12/09/2023   BILITOT 0.7 12/09/2023   No results found for: "HGBA1C"  Screening for Secondary Hypertension:      11/20/2023    3:45 PM  Causes  Drugs/Herbals Screened     - Comments high sodium diet.  limits caffaine.  rare EtOH.  No supplements  Renovascular HTN N/A  Sleep Apnea Screened     - Comments no symptoms  Thyroid Disease Screened  Hyperaldosteronism N/A  Pheochromocytoma N/A  Cushing's Syndrome N/A  Hyperparathyroidism N/A  Coarctation of the Aorta Not Screened     - Comments unable to check bilateral BP  Compliance Screened    Relevant Labs/Studies:    Latest Ref Rng & Units 12/09/2023    8:40 AM 10/21/2023    5:09 AM 10/20/2023    3:06 PM  Basic Labs  Sodium 135 - 145 mmol/L 133  130  129   Potassium 3.5 - 5.1 mmol/L 4.1  4.6    Creatinine 0.44 - 1.00 mg/dL 1.61  0.96         Latest Ref Rng & Units 10/18/2023    2:30 PM 11/08/2022    3:36 PM  Thyroid   TSH 0.350 - 4.500 uIU/mL 2.177  3.180                 Home Medications    Current Outpatient Medications  Medication Sig Dispense Refill   allopurinol (ZYLOPRIM) 300 MG tablet Take 300 mg by mouth every morning.     B Complex-C (B-COMPLEX WITH VITAMIN C) tablet Take 1 tablet by mouth daily.     Biotin 04540 MCG TABS Take 10,000 mcg by mouth every morning.     carvedilol (COREG) 12.5 MG tablet Take 1 tablet (12.5 mg total) by mouth 2 (two) times daily. 180 tablet 3   cholecalciferol (VITAMIN D3) 25 MCG (1000 UT) tablet Take 1,000 Units by mouth every morning.     cloNIDine (CATAPRES - DOSED IN MG/24 HR) 0.2 mg/24hr patch Place 1 patch (0.2 mg total) onto the skin once a week. 12 patch 3   Collagen-Vitamin C-Biotin (COLLAGEN PO) Take 1,000 mg by mouth  daily.     furosemide (LASIX) 20 MG tablet Take 20 mg by mouth 2 (two) times daily.     Krill Oil 1000 MG CAPS 1 capsule Orally once a day     levothyroxine (SYNTHROID) 100 MCG tablet Take 100 mcg by mouth every evening.     Multiple Vitamins-Minerals (HAIR FORMULA EXTRA STRENGTH PO)  4 tablets Orally once a day     NIFEdipine (PROCARDIA-XL/NIFEDICAL-XL) 30 MG 24 hr tablet Take 1 tablet by mouth each morning. 30 tablet 3   rosuvastatin (CRESTOR) 20 MG tablet Take 1 tablet (20 mg total) by mouth daily. 90 tablet 3   tamoxifen (NOLVADEX) 10 MG tablet TAKE ONE TABLET BY MOUTH DAILY (Patient taking differently: Take 10 mg by mouth every evening.) 90 tablet 3   valsartan (DIOVAN) 320 MG tablet Take 320 mg by mouth daily.     Vitamin E 670 MG (1000 UT) CAPS Take 1,000 Units by mouth every morning.     No current facility-administered medications for this visit.     Assessment & Plan      Hypertension Assessment: BP is controlled in office BP 99/62 mmHg;  above the goal (<130/80). Home readings average 143/78 in am and 132/68 in evenings Tolerates carvedilol 12.5 mg bid, clonidine 0.2 mg patch tue hs, furosemide 20 mg bid, valsartan 320 mg every day  (pm) ,nifedipine xl 30 mg qam well without any side effects Denies SOB, palpitation, chest pain, headaches,or swelling No change in LEE (has been on furosemide for some time) since adding in nifedipine Reiterated the importance of regular exercise and low salt diet   Plan:  Continue taking carvedilol 12.5 mg bid, clonidine 0.2 mg patch tue hs, furosemide 20 mg bid, valsartan 320 mg every day  (pm) ,nifedipine xl 30 mg qam Patient to keep record of BP readings with heart rate and report to us  at the next visit Patient to follow up with me in May  Labs ordered today:  none   Donivan Furry PharmD CPP [facility]  [address] [place], [place] [address] [phone]

## 2024-01-21 NOTE — Progress Notes (Signed)
 [facility] PRE[facility] Weekly Session  Patient Details  Name: Karen Mills MRN: [id number] Date of Birth: [birth year] Age: 68 y.o. PCP: Jimmey Mould, MD  Vitals:   01/21/24 1119  Weight: 226 lb (102.5 kg)     [facility] Weekly seesion - 01/21/24 1100       [facility] "PREP" Location   [facility]PREP[facility] Location [facility]      Weekly Session   Topic Discussed Other   Portion control; visualize your portion size demo; review of Red. sugar craisins food label.   Minutes exercised this week 100 minutes    Classes attended to date 23             Karen Mills 01/21/2024, 11:21 AM

## 2024-01-28 DIAGNOSIS — Z0000 Encounter for general adult medical examination without abnormal findings: Secondary | ICD-10-CM | POA: Diagnosis not present

## 2024-01-28 DIAGNOSIS — E782 Mixed hyperlipidemia: Secondary | ICD-10-CM | POA: Diagnosis not present

## 2024-01-28 DIAGNOSIS — I10 Essential (primary) hypertension: Secondary | ICD-10-CM | POA: Diagnosis not present

## 2024-01-28 DIAGNOSIS — M109 Gout, unspecified: Secondary | ICD-10-CM | POA: Diagnosis not present

## 2024-01-28 DIAGNOSIS — E039 Hypothyroidism, unspecified: Secondary | ICD-10-CM | POA: Diagnosis not present

## 2024-02-04 NOTE — Progress Notes (Signed)
 [facility] PREP Weekly Session  Patient Details  Name: Karen Mills MRN: [id number] Date of Birth: Dec 25, 1955 Age: 68 y.o. PCP: Jimmey Mould, MD  There were no vitals filed for this visit.   [facility] Weekly seesion - 02/04/24 1100       [facility] "PREP" Location   [facility] "PREP[facility] Location [facility]      Weekly Session   Topic Discussed Calorie breakdown   Carbs, fats and proteins; simple vs. complex carbs; nutritional supplement trustworthy organizations; membership talk w/Nick   Minutes exercised this week 100 minutes    Classes attended to date 41             Arriana Lohmann B Jerran Tappan 02/04/2024, 11:55 AM

## 2024-02-10 ENCOUNTER — Inpatient Hospital Stay: Payer: Medicare Other | Attending: Hematology and Oncology

## 2024-02-10 DIAGNOSIS — Z170 Estrogen receptor positive status [ER+]: Secondary | ICD-10-CM | POA: Diagnosis not present

## 2024-02-10 DIAGNOSIS — D649 Anemia, unspecified: Secondary | ICD-10-CM

## 2024-02-10 DIAGNOSIS — Z1732 Human epidermal growth factor receptor 2 negative status: Secondary | ICD-10-CM | POA: Diagnosis not present

## 2024-02-10 DIAGNOSIS — I129 Hypertensive chronic kidney disease with stage 1 through stage 4 chronic kidney disease, or unspecified chronic kidney disease: Secondary | ICD-10-CM | POA: Diagnosis not present

## 2024-02-10 DIAGNOSIS — Z1721 Progesterone receptor positive status: Secondary | ICD-10-CM | POA: Diagnosis not present

## 2024-02-10 DIAGNOSIS — C50512 Malignant neoplasm of lower-outer quadrant of left female breast: Secondary | ICD-10-CM | POA: Diagnosis not present

## 2024-02-10 DIAGNOSIS — N1831 Chronic kidney disease, stage 3a: Secondary | ICD-10-CM | POA: Diagnosis not present

## 2024-02-10 LAB — CBC WITH DIFFERENTIAL (CANCER CENTER ONLY)
Abs Immature Granulocytes: 0.06 10*3/uL (ref 0.00–0.07)
Basophils Absolute: 0.1 10*3/uL (ref 0.0–0.1)
Basophils Relative: 1 %
Eosinophils Absolute: 0.3 10*3/uL (ref 0.0–0.5)
Eosinophils Relative: 4 %
HCT: 29 % — ABNORMAL LOW (ref 36.0–46.0)
Hemoglobin: 10.3 g/dL — ABNORMAL LOW (ref 12.0–15.0)
Immature Granulocytes: 1 %
Lymphocytes Relative: 25 %
Lymphs Abs: 1.7 10*3/uL (ref 0.7–4.0)
MCH: 34.3 pg — ABNORMAL HIGH (ref 26.0–34.0)
MCHC: 35.5 g/dL (ref 30.0–36.0)
MCV: 96.7 fL (ref 80.0–100.0)
Monocytes Absolute: 0.7 10*3/uL (ref 0.1–1.0)
Monocytes Relative: 10 %
Neutro Abs: 4 10*3/uL (ref 1.7–7.7)
Neutrophils Relative %: 59 %
Platelet Count: 278 10*3/uL (ref 150–400)
RBC: 3 MIL/uL — ABNORMAL LOW (ref 3.87–5.11)
RDW: 12.5 % (ref 11.5–15.5)
WBC Count: 6.7 10*3/uL (ref 4.0–10.5)
nRBC: 0 % (ref 0.0–0.2)

## 2024-02-11 NOTE — Progress Notes (Signed)
 [facility] PREP Weekly Session  Patient Details  Name: Karen Mills MRN: [id number] Date of Birth: 10/30/1955 Age: 68 y.o. PCP: Jimmey Mould, MD  Vitals:   02/11/24 1148  Weight: 228 lb (103.4 kg)     [facility] Weekly seesion - 02/11/24 1100       [facility] "PREP" Location   [facility]PREP[facility] Location [facility]      Weekly Session   Topic Discussed Hitting roadblocks   Reviewed end of program documentation, reviewed 100 calorie food labels.   Classes attended to date 53             Caera Enwright B Lawonda Pretlow 02/11/2024, 11:48 AM

## 2024-02-17 ENCOUNTER — Telehealth: Payer: Self-pay

## 2024-02-17 ENCOUNTER — Ambulatory Visit (HOSPITAL_BASED_OUTPATIENT_CLINIC_OR_DEPARTMENT_OTHER): Payer: Medicare Other

## 2024-02-17 NOTE — Telephone Encounter (Signed)
 Pt called with concern regarding 5/5 labs, asking if she should have retacrit . Pt was offered phone visit with MD to further discuss. She is agreeable to 02/24/24 at 0945. Message sent to scheduler.

## 2024-02-18 DIAGNOSIS — L814 Other melanin hyperpigmentation: Secondary | ICD-10-CM | POA: Diagnosis not present

## 2024-02-18 DIAGNOSIS — D225 Melanocytic nevi of trunk: Secondary | ICD-10-CM | POA: Diagnosis not present

## 2024-02-18 DIAGNOSIS — L570 Actinic keratosis: Secondary | ICD-10-CM | POA: Diagnosis not present

## 2024-02-18 DIAGNOSIS — L821 Other seborrheic keratosis: Secondary | ICD-10-CM | POA: Diagnosis not present

## 2024-02-18 NOTE — Progress Notes (Signed)
 [facility] PREP Weekly Session  Patient Details  Name: Karen Mills MRN: [id number] Date of Birth: [birth year] Age: 68 y.o. PCP: Jimmey Mould, MD  Vitals:   02/18/24 1119  Weight: 230 lb (104.3 kg)     [facility] Weekly seesion - 02/18/24 1100       [facility] "PREP" Location   [facility] "PREP[facility] Location [facility]      Weekly Session   Topic Discussed Other   How fit and strong survey completed; fit testing completed; final documents reviewed, final assessment visit scheduled for Thursday   Minutes exercised this week 100 minutes    Classes attended to date 89             Marysa Wessner B Tran Randle 02/18/2024, 11:19 AM

## 2024-02-19 ENCOUNTER — Telehealth: Payer: Self-pay | Admitting: Hematology and Oncology

## 2024-02-20 NOTE — Progress Notes (Signed)
 [facility] Evaluation  Patient Details  Name: Karen Mills MRN: [id number] Date of Birth: 1956-01-01 Age: 68 y.o. PCP: Jimmey Mould, MD  Vitals:   02/20/24 0917  BP: 124/74  Pulse: 67  SpO2: 98%  Weight: 229 lb 6.4 oz (104.1 kg)     [facility] Eval - 02/20/24 0900       [facility] "PREP" Location   [facility] "PREP" Location [facility]      Referral    Program Start Date 12/03/23    Program End Date 02/20/24      Measurement   Waist Circumference 44 inches    Waist Circumference End Program 44 inches    Hip Circumference 52.5 inches    Hip Circumference End Program 52 inches    Body fat 47.6 percent      Mobility and Daily Activities   I find it easy to walk up or down two or more flights of stairs. 1    I have no trouble taking out the trash. 4    I do housework such as vacuuming and dusting on my own without difficulty. 4    I can easily lift a gallon of milk (8lbs). 4    I can easily walk a mile. 4    I have no trouble reaching into high cupboards or reaching down to pick up something from the floor. 4    I do not have trouble doing out-door work such as Loss adjuster, chartered, raking leaves, or gardening. 4      Mobility and Daily Activities   I feel younger than my age. 3    I feel independent. 4    I feel energetic. 3    I live an active life.  4    I feel strong. 3    I feel healthy. 3    I feel active as other people my age. 3      How fit and strong are you.   Fit and Strong Total Score 48            Past Medical History:  Diagnosis Date   Breast cancer (HCC) 2020   Left Breast Cancer   CKD (chronic kidney disease)    Gout    Hyperlipidemia    Hypertension    Hypothyroidism    Megaloblastic anemia    Obesity    Personal history of radiation therapy 2020   Left Breast Cancer   PONV (postoperative nausea and vomiting)    SCC (squamous cell carcinoma) Keratoacanthoma 10/10/2016   Right Shin (Cx3,5FU)   Squamous cell carcinoma in situ (SCCIS)  11/15/2016   Right Lower Shin (tx p bx)   Varicose veins    Past Surgical History:  Procedure Laterality Date   ABLATION ON ENDOMETRIOSIS     BREAST LUMPECTOMY Left 04/30/2019   BREAST LUMPECTOMY WITH RADIOACTIVE SEED AND SENTINEL LYMPH NODE BIOPSY Left 04/30/2019   Procedure: LEFT BREAST LUMPECTOMY WITH RADIOACTIVE SEED AND SENTINEL LYMPH NODE BIOPSY;  Surgeon: Caralyn Chandler, MD;  Location: [facility];  Service: General;  Laterality: Left;   FOOT SURGERY     IR ANGIO EXTERNAL CAROTID SEL EXT CAROTID BILAT MOD SED  12/01/2021   IR ANGIO INTRA EXTRACRAN SEL COM CAROTID INNOMINATE BILAT MOD SED  12/01/2021   IR ANGIO VERTEBRAL SEL VERTEBRAL UNI R MOD SED  12/01/2021   IR ANGIOGRAM FOLLOW UP STUDY  12/01/2021   IR ANGIOGRAM FOLLOW UP STUDY  12/01/2021  IR ANGIOGRAM FOLLOW UP STUDY  12/01/2021   IR NEURO EACH ADD'L AFTER BASIC UNI LEFT (MS)  12/01/2021   IR NEURO EACH ADD'L AFTER BASIC UNI RIGHT (MS)  12/01/2021   IR NEURO EACH ADD'L AFTER BASIC UNI RIGHT (MS)  12/01/2021   IR TRANSCATH/EMBOLIZ  12/01/2021   IR TRANSCATH/EMBOLIZ  12/01/2021   RADIOLOGY WITH ANESTHESIA N/A 12/01/2021   Procedure: RADIOLOGY WITH ANESTHESIA;  Surgeon: Luellen Sages, MD;  Location: [facility];  Service: Radiology;  Laterality: N/A;   TONSILLECTOMY     vein removal     Social History   Tobacco Use  Smoking Status Never  Smokeless Tobacco Never  How fit and strong survey: 12/02/23: 36 02/18/24: 48 Bp at Start: 180/84 BP at end: 124/74 Inches lost: 0.5 inches Education sessions completed: 9 Workout sessions completed: 11    Alayziah Tangeman B Malie Kashani 02/20/2024, 9:21 AM

## 2024-02-21 DIAGNOSIS — N1831 Chronic kidney disease, stage 3a: Secondary | ICD-10-CM | POA: Diagnosis not present

## 2024-02-21 DIAGNOSIS — R768 Other specified abnormal immunological findings in serum: Secondary | ICD-10-CM | POA: Diagnosis not present

## 2024-02-21 DIAGNOSIS — R809 Proteinuria, unspecified: Secondary | ICD-10-CM | POA: Diagnosis not present

## 2024-02-21 DIAGNOSIS — E871 Hypo-osmolality and hyponatremia: Secondary | ICD-10-CM | POA: Diagnosis not present

## 2024-02-24 ENCOUNTER — Inpatient Hospital Stay: Admitting: Adult Health

## 2024-02-24 DIAGNOSIS — D649 Anemia, unspecified: Secondary | ICD-10-CM

## 2024-02-24 DIAGNOSIS — Z1721 Progesterone receptor positive status: Secondary | ICD-10-CM

## 2024-02-24 DIAGNOSIS — Z1732 Human epidermal growth factor receptor 2 negative status: Secondary | ICD-10-CM

## 2024-02-24 DIAGNOSIS — Z170 Estrogen receptor positive status [ER+]: Secondary | ICD-10-CM

## 2024-02-24 DIAGNOSIS — C50512 Malignant neoplasm of lower-outer quadrant of left female breast: Secondary | ICD-10-CM

## 2024-02-24 DIAGNOSIS — N182 Chronic kidney disease, stage 2 (mild): Secondary | ICD-10-CM

## 2024-02-24 NOTE — Progress Notes (Signed)
 [facility] Cancer Follow up:    Karen Mould, MD [address] [place] [address]   DIAGNOSIS:  Cancer Staging  Malignant neoplasm of lower-outer quadrant of left breast of female, estrogen receptor positive (HCC) Staging form: Breast, AJCC 8th Edition - Clinical stage from 04/03/2019: Stage IA (cT1a, cN0, cM0, G2, ER+, PR+, HER2-) - Signed by Percival Brace, NP on 04/15/2019 Stage prefix: Initial diagnosis Histologic grading system: 3 grade system - Pathologic stage from 05/19/2019: Stage IA (pT1b, pN0(sn), cM0, G1, ER+, PR+, HER2-) - Unsigned Stage prefix: Initial diagnosis Method of lymph node assessment: Sentinel lymph node biopsy Multigene prognostic tests performed: None Histologic grading system: 3 grade system  I connected with Karen Mills on 02/24/24 at  9:00 AM EDT by telephone and verified that I am speaking with the correct person using two identifiers.  I discussed the limitations, risks, security and privacy concerns of performing an evaluation and management service by telephone and the availability of in person appointments.  I also discussed with the patient that there may be a patient responsible charge related to this service. The patient expressed understanding and agreed to proceed.    SUMMARY OF ONCOLOGIC HISTORY: Oncology History  Malignant neoplasm of lower-outer quadrant of left breast of female, estrogen receptor positive (HCC)  04/03/2019 Cancer Staging   Staging form: Breast, AJCC 8th Edition - Clinical stage from 04/03/2019: Stage IA (cT1a, cN0, cM0, G2, ER+, PR+, HER2-) - Signed by Percival Brace, NP on 04/15/2019   04/15/2019 Initial Diagnosis   Screening mammogram detected 0.4cm mass in the left breast at the 6 o'clock position with no left axillary adenopathy. Biopsy on 04/03/19 showed IDC, grade 1-2, HER-2 - (0), ER +95%, PR+ 95%, Ki67 15%.    04/30/2019 Surgery   Left lumpectomy Alethea Andes): IDC, grade 1,  0.8cm, intermediate grade DCIS, lymphovascular invasion present, clear margins. Three left axillary lymph nodes negative for carcinoma.    06/02/2019 -  Radiation Therapy   Adjuvant radiation   08/2019 -  Anti-estrogen oral therapy   Anastrozole  daily stopped 10/2019 due to severe joint pain; switched to tamoxifen  10mg  on 01/11/20   10/06/2021 Genetic Testing   Negative hereditary cancer genetic testing: no pathogenic variants detected in [facility] CancerNext-Expanded +RNAinsight Panel.  Variants of uncertain significance detected in TSC1 at  p.L439V (c.1315C>G) and in TSC2 at p.D624N (c.1870G>A).  The report date is 10/06/2021.  The CancerNext-Expanded gene panel offered by [facility] and includes sequencing, rearrangement, and RNA analysis for the following 77 genes: AIP, ALK, APC, ATM, AXIN2, BAP1, BARD1, BLM, BMPR1A, BRCA1, BRCA2, BRIP1, CDC73, CDH1, CDK4, CDKN1B, CDKN2A, CHEK2, CTNNA1, DICER1, FANCC, FH, FLCN, GALNT12, KIF1B, LZTR1, MAX, MEN1, MET, MLH1, MSH2, MSH3, MSH6, MUTYH, NBN, NF1, NF2, NTHL1, PALB2, PHOX2B, PMS2, POT1, PRKAR1A, PTCH1, PTEN, RAD51C, RAD51D, RB1, RECQL, RET, SDHA, SDHAF2, SDHB, SDHC, SDHD, SMAD4, SMARCA4, SMARCB1, SMARCE1, STK11, SUFU, TMEM127, TP53, TSC1, TSC2, VHL and XRCC2 (sequencing and deletion/duplication); EGFR, EGLN1, HOXB13, KIT, MITF, PDGFRA, POLD1, and POLE (sequencing only); EPCAM and GREM1 (deletion/duplication only).  UPDATE: TSC1 p.L439V VUS has been amended to Benign. Amended report date is 08/13/2022.     CURRENT THERAPY: Retacrit , Tamoxifen   INTERVAL HISTORY:  Karen Mills 68 y.o. female returns for f/u of her anemia.  She has noted a decreased energy level and her hemoglobin on May 5 was 10.3.  She wonders if she could receive an injection.  She notes that she always feels better when her hemoglobin  is a little bit higher and is concerned it may have dropped down further from 10.3.  She tells me that she continues to take tamoxifen  with good  tolerance.   Patient Active Problem List   Diagnosis Date Noted   Hyponatremia 10/18/2023   Hypomagnesemia 10/18/2023   ABLA (acute blood loss anemia)    Endotracheally intubated    Normocytic normochromic anemia 12/01/2021   Acute posterior epistaxis 12/01/2021   Hyperlipidemia 12/01/2021   Hypertension 12/01/2021   Hypothyroidism 12/01/2021   COVID-19 virus infection 12/01/2021   Epistaxis 12/01/2021   Genetic testing 10/10/2021   Family history of pancreatic cancer 09/13/2021   Malignant neoplasm of lower-outer quadrant of left breast of female, estrogen receptor positive (HCC) 04/15/2019   Varicose veins of bilateral lower extremities with other complications 12/21/2013    is allergic to chlorthalidone, accupril [quinapril hcl], amlodipine besylate, atacand [candesartan], atorvastatin , cozaar [losartan potassium], hydrochlorothiazide-triamterene, tiazac [diltiazem hcl er beads], and zestril [lisinopril].  MEDICAL HISTORY: Past Medical History:  Diagnosis Date   Breast cancer (HCC) 2020   Left Breast Cancer   CKD (chronic kidney disease)    Gout    Hyperlipidemia    Hypertension    Hypothyroidism    Megaloblastic anemia    Obesity    Personal history of radiation therapy 2020   Left Breast Cancer   PONV (postoperative nausea and vomiting)    SCC (squamous cell carcinoma) Keratoacanthoma 10/10/2016   Right Shin (Cx3,5FU)   Squamous cell carcinoma in situ (SCCIS) 11/15/2016   Right Lower Shin (tx p bx)   Varicose veins     SURGICAL HISTORY: Past Surgical History:  Procedure Laterality Date   ABLATION ON ENDOMETRIOSIS     BREAST LUMPECTOMY Left 04/30/2019   BREAST LUMPECTOMY WITH RADIOACTIVE SEED AND SENTINEL LYMPH NODE BIOPSY Left 04/30/2019   Procedure: LEFT BREAST LUMPECTOMY WITH RADIOACTIVE SEED AND SENTINEL LYMPH NODE BIOPSY;  Surgeon: Caralyn Chandler, MD;  Location: [facility];  Service: General;  Laterality: Left;   FOOT SURGERY     IR ANGIO  EXTERNAL CAROTID SEL EXT CAROTID BILAT MOD SED  12/01/2021   IR ANGIO INTRA EXTRACRAN SEL COM CAROTID INNOMINATE BILAT MOD SED  12/01/2021   IR ANGIO VERTEBRAL SEL VERTEBRAL UNI R MOD SED  12/01/2021   IR ANGIOGRAM FOLLOW UP STUDY  12/01/2021   IR ANGIOGRAM FOLLOW UP STUDY  12/01/2021   IR ANGIOGRAM FOLLOW UP STUDY  12/01/2021   IR NEURO EACH ADD'L AFTER BASIC UNI LEFT (MS)  12/01/2021   IR NEURO EACH ADD'L AFTER BASIC UNI RIGHT (MS)  12/01/2021   IR NEURO EACH ADD'L AFTER BASIC UNI RIGHT (MS)  12/01/2021   IR TRANSCATH/EMBOLIZ  12/01/2021   IR TRANSCATH/EMBOLIZ  12/01/2021   RADIOLOGY WITH ANESTHESIA N/A 12/01/2021   Procedure: RADIOLOGY WITH ANESTHESIA;  Surgeon: Luellen Sages, MD;  Location: [facility];  Service: Radiology;  Laterality: N/A;   TONSILLECTOMY     vein removal      SOCIAL HISTORY: Social History   Socioeconomic History   Marital status: Married    Spouse name: Not on file   Number of children: Not on file   Years of education: Not on file   Highest education level: Not on file  Occupational History   Occupation: retired  Tobacco Use   Smoking status: Never   Smokeless tobacco: Never  Vaping Use   Vaping status: Never Used  Substance and Sexual Activity   Alcohol use: Yes  Comment: 2-3x per week   Drug use: No   Sexual activity: Not on file  Other Topics Concern   Not on file  Social History Narrative   Not on file   Social Drivers of Health   Financial Resource Strain: Not on file  Food Insecurity: No Food Insecurity (10/18/2023)   Hunger Vital Sign    Worried About Running Out of Food in the Last Year: Never true    Ran Out of Food in the Last Year: Never true  Transportation Needs: No Transportation Needs (10/18/2023)   PRAPARE - Administrator, Civil Service (Medical): No    Lack of Transportation (Non-Medical): No  Physical Activity: Not on file  Stress: Not on file  Social Connections: Moderately Integrated (10/18/2023)   Social Connection  and Isolation Panel [NHANES]    Frequency of Communication with Friends and Family: More than three times a week    Frequency of Social Gatherings with Friends and Family: Twice a week    Attends Religious Services: 1 to 4 times per year    Active Member of [employer] or Organizations: No    Attends Banker Meetings: Never    Marital Status: Married  Catering manager Violence: Not At Risk (10/18/2023)   Humiliation, Afraid, Rape, and Kick questionnaire    Fear of Current or Ex-Partner: No    Emotionally Abused: No    Physically Abused: No    Sexually Abused: No    FAMILY HISTORY: Family History  Problem Relation Age of Onset   Heart failure Mother    Heart disease Mother    Heart disease Father    Heart disease Brother    Pancreatic cancer Half-Brother        d. early 90s    Review of Systems  Constitutional:  Positive for fatigue. Negative for appetite change, chills, fever and unexpected weight change.  HENT:   Negative for hearing loss, lump/mass and trouble swallowing.   Eyes:  Negative for eye problems and icterus.  Respiratory:  Negative for chest tightness, cough and shortness of breath.   Cardiovascular:  Negative for chest pain, leg swelling and palpitations.  Gastrointestinal:  Negative for abdominal distention, abdominal pain, constipation, diarrhea, nausea and vomiting.  Endocrine: Negative for hot flashes.  Genitourinary:  Negative for difficulty urinating.   Musculoskeletal:  Negative for arthralgias.  Skin:  Negative for itching and rash.  Neurological:  Negative for dizziness, extremity weakness, headaches and numbness.  Hematological:  Negative for adenopathy. Does not bruise/bleed easily.  Psychiatric/Behavioral:  Negative for depression. The patient is not nervous/anxious.       PHYSICAL EXAMINATION  Patient sounds well.  She is in no apparent distress.  Mood and behavior are normal.  Breathing is nonlabored.   LABORATORY DATA:  CBC     Component Value Date/Time   WBC 6.7 02/10/2024 0759   WBC 8.4 10/19/2023 0649   RBC 3.00 (L) 02/10/2024 0759   HGB 10.3 (L) 02/10/2024 0759   HGB 9.5 (L) 11/02/2022 1100   HCT 29.0 (L) 02/10/2024 0759   HCT 28.4 (L) 11/02/2022 1100   PLT 278 02/10/2024 0759   PLT 334 11/02/2022 1100   MCV 96.7 02/10/2024 0759   MCV 102 (H) 11/02/2022 1100   MCH 34.3 (H) 02/10/2024 0759   MCHC 35.5 02/10/2024 0759   RDW 12.5 02/10/2024 0759   RDW 14.0 11/02/2022 1100   LYMPHSABS 1.7 02/10/2024 0759   MONOABS 0.7 02/10/2024 0759   EOSABS  0.3 02/10/2024 0759   BASOSABS 0.1 02/10/2024 0759      ASSESSMENT and THERAPY PLAN:   Malignant neoplasm of lower-outer quadrant of left breast of female, estrogen receptor positive (HCC) 04/15/2019:Screening mammogram detected 0.4cm mass in the left breast at the 6 o'clock position with no left axillary adenopathy. Biopsy on 04/03/19 showed IDC, grade 1-2, HER-2 - (0), ER +95%, PR+ 95%, Ki67 15%.  T1 a N0 stage Ia clinical stage   Treatment plan: 1.  Breast conserving surgery with sentinel lymph node biopsy 04/30/2019: Grade 1 IDC 0.8 cm, 0/3 lymph nodes, ER 95%, PR 95%, KI 6715%, HER-2 negative, T1BN0 stage Ia 2.  Adjuvant radiation therapy 06/02/2019- 3.  Follow-up adjuvant antiestrogen therapy with anastrozole  1 mg daily x5 years/BCI predictive for extended endocrine therapy benefit--unable to tolerate anastrozole  therefore plan is tamoxifen  x 10 years -------------------------------------------------------------------------------------------------------------------- Current treatment: Adjuvant antiestrogen therapy with anastrozole  1 mg daily started 07/09/2019 (discontinued due to severe joint aches and pains) switched to tamoxifen  10 mg started 01/11/2020 (planned for 10 years)   Tamoxifen  toxicities: Tolerating it extremely well--will continue taking this medication daily.    Breast cancer surveillance: Mammogram 04/10/2023: Benign, breast density category  B  Normocytic normochromic anemia Hospitalization 10/18/2023-10/21/2023: Severe hyponatremia: Resolved with fluid restriction Lab review: 12/12/2022: Ferritin 193, iron saturation 13%, hemoglobin 9.5, creatinine 1.42 01/15/2023: Hemoglobin 9.4, MCV 98.3, platelets 317 02/12/2023: Hemoglobin 9.7 03/27/2023: Hemoglobin 10 05/07/2023: Hemoglobin 10.5 (proceed with today's injection) 05/28/2023: Hemoglobin 10.3-Retacrit  06/19/2023: Hemoglobin 10.4-Retacrit  09/10/2023: Hemoglobin 10.7 02/10/2024: Hemoglobin 10.3   Anemia of chronic kidney disease stage II-III. Current treatment: Retacrit  40,000 units every 3 weeks started 01/22/2023 (last injection was on 06/18/2023) Goal hemoglobin: 10.5 g Considering her most recent hemoglobin of 10.3 and increased fatigue she will return for lab check.  If hemoglobin is 10.5 or lower we will proceed with Retacrit  injection. - Continue with lab testing as scheduled and if hemoglobin is low will go ahead and add injection appointments to labs - Most recent ferritin is 223.  That was tested in January of this year.      Follow up instructions:    -Return to cancer center lab and injection this week -Then labs as scheduled    The patient was provided an opportunity to ask questions and all were answered. The patient agreed with the plan and demonstrated an understanding of the instructions.   The patient was advised to call back or seek an in-person evaluation if the symptoms worsen or if the condition fails to improve as anticipated.   I provided 10 minutes of non face-to-face telephone visit time during this encounter, and > 50% was spent counseling as documented under my assessment & plan.   Alwin Baars, NP 02/24/24 9:34 AM Medical Oncology and Hematology [facility] [address] [place], [place] [address] Tel. [phone]    Fax. [phone]  *Total Encounter Time as defined by the Centers for Medicare and Medicaid Services includes, in  addition to the face-to-face time of a patient visit (documented in the note above) non-face-to-face time: obtaining and reviewing outside history, ordering and reviewing medications, tests or procedures, care coordination (communications with other health care professionals or caregivers) and documentation in the medical record.

## 2024-02-24 NOTE — Assessment & Plan Note (Signed)
 04/15/2019:Screening mammogram detected 0.4cm mass in the left breast at the 6 o'clock position with no left axillary adenopathy. Biopsy on 04/03/19 showed IDC, grade 1-2, HER-2 - (0), ER +95%, PR+ 95%, Ki67 15%.  T1 a N0 stage Ia clinical stage   Treatment plan: 1.  Breast conserving surgery with sentinel lymph node biopsy 04/30/2019: Grade 1 IDC 0.8 cm, 0/3 lymph nodes, ER 95%, PR 95%, KI 6715%, HER-2 negative, T1BN0 stage Ia 2.  Adjuvant radiation therapy 06/02/2019- 3.  Follow-up adjuvant antiestrogen therapy with anastrozole  1 mg daily x5 years/BCI predictive for extended endocrine therapy benefit--unable to tolerate anastrozole  therefore plan is tamoxifen  x 10 years -------------------------------------------------------------------------------------------------------------------- Current treatment: Adjuvant antiestrogen therapy with anastrozole  1 mg daily started 07/09/2019 (discontinued due to severe joint aches and pains) switched to tamoxifen  10 mg started 01/11/2020 (planned for 10 years)   Tamoxifen  toxicities: Tolerating it extremely well--will continue taking this medication daily.    Breast cancer surveillance: Mammogram 04/10/2023: Benign, breast density category B

## 2024-02-24 NOTE — Assessment & Plan Note (Signed)
 Hospitalization 10/18/2023-10/21/2023: Severe hyponatremia: Resolved with fluid restriction Lab review: 12/12/2022: Ferritin 193, iron saturation 13%, hemoglobin 9.5, creatinine 1.42 01/15/2023: Hemoglobin 9.4, MCV 98.3, platelets 317 02/12/2023: Hemoglobin 9.7 03/27/2023: Hemoglobin 10 05/07/2023: Hemoglobin 10.5 (proceed with today's injection) 05/28/2023: Hemoglobin 10.3-Retacrit  06/19/2023: Hemoglobin 10.4-Retacrit  09/10/2023: Hemoglobin 10.7 02/10/2024: Hemoglobin 10.3   Anemia of chronic kidney disease stage II-III. Current treatment: Retacrit  40,000 units every 3 weeks started 01/22/2023 (last injection was on 06/18/2023) Goal hemoglobin: 10.5 g Considering her most recent hemoglobin of 10.3 and increased fatigue she will return for lab check.  If hemoglobin is 10.5 or lower we will proceed with Retacrit  injection. - Continue with lab testing as scheduled and if hemoglobin is low will go ahead and add injection appointments to labs - Most recent ferritin is 223.  That was tested in January of this year.

## 2024-02-25 ENCOUNTER — Inpatient Hospital Stay

## 2024-02-25 DIAGNOSIS — D649 Anemia, unspecified: Secondary | ICD-10-CM

## 2024-02-25 DIAGNOSIS — C50512 Malignant neoplasm of lower-outer quadrant of left female breast: Secondary | ICD-10-CM | POA: Diagnosis not present

## 2024-02-25 DIAGNOSIS — Z1721 Progesterone receptor positive status: Secondary | ICD-10-CM | POA: Diagnosis not present

## 2024-02-25 DIAGNOSIS — Z170 Estrogen receptor positive status [ER+]: Secondary | ICD-10-CM | POA: Diagnosis not present

## 2024-02-25 DIAGNOSIS — Z1732 Human epidermal growth factor receptor 2 negative status: Secondary | ICD-10-CM | POA: Diagnosis not present

## 2024-02-25 LAB — CBC WITH DIFFERENTIAL (CANCER CENTER ONLY)
Abs Immature Granulocytes: 0.04 10*3/uL (ref 0.00–0.07)
Basophils Absolute: 0.1 10*3/uL (ref 0.0–0.1)
Basophils Relative: 1 %
Eosinophils Absolute: 0.3 10*3/uL (ref 0.0–0.5)
Eosinophils Relative: 5 %
HCT: 30.4 % — ABNORMAL LOW (ref 36.0–46.0)
Hemoglobin: 10.6 g/dL — ABNORMAL LOW (ref 12.0–15.0)
Immature Granulocytes: 1 %
Lymphocytes Relative: 26 %
Lymphs Abs: 1.7 10*3/uL (ref 0.7–4.0)
MCH: 34.2 pg — ABNORMAL HIGH (ref 26.0–34.0)
MCHC: 34.9 g/dL (ref 30.0–36.0)
MCV: 98.1 fL (ref 80.0–100.0)
Monocytes Absolute: 0.6 10*3/uL (ref 0.1–1.0)
Monocytes Relative: 9 %
Neutro Abs: 3.8 10*3/uL (ref 1.7–7.7)
Neutrophils Relative %: 58 %
Platelet Count: 306 10*3/uL (ref 150–400)
RBC: 3.1 MIL/uL — ABNORMAL LOW (ref 3.87–5.11)
RDW: 12.6 % (ref 11.5–15.5)
WBC Count: 6.6 10*3/uL (ref 4.0–10.5)
nRBC: 0 % (ref 0.0–0.2)

## 2024-02-25 MED ORDER — EPOETIN ALFA-EPBX 40000 UNIT/ML IJ SOLN
40000.0000 [IU] | Freq: Once | INTRAMUSCULAR | Status: DC
Start: 2024-02-25 — End: 2024-02-25

## 2024-02-25 NOTE — Addendum Note (Signed)
 Addended by: Delrae Field B on: 02/25/2024 08:36 AM   Modules accepted: Orders

## 2024-02-25 NOTE — Progress Notes (Signed)
 Pt presented to lab for scheduled appointment for recheck of her hemoglobin.  Labs resulted Hemoglobin is 10.6 as of 02/25/2024. Per the provider's note "Considering her most recent hemoglobin of 10.3 and increased fatigue she will return for lab check. If hemoglobin is 10.5 or lower we will proceed with Retacrit  injection". Injection was held per pt recent lab showing hemoglobin 10.6. Pt notified and print out was provided.

## 2024-02-28 ENCOUNTER — Encounter (HOSPITAL_BASED_OUTPATIENT_CLINIC_OR_DEPARTMENT_OTHER): Payer: Self-pay

## 2024-02-28 ENCOUNTER — Ambulatory Visit (HOSPITAL_BASED_OUTPATIENT_CLINIC_OR_DEPARTMENT_OTHER): Admitting: Pharmacist Clinician (PhC)/ Clinical Pharmacy Specialist

## 2024-02-28 VITALS — BP 120/66 | HR 68 | Ht 68.0 in | Wt 232.0 lb

## 2024-02-28 DIAGNOSIS — I10 Essential (primary) hypertension: Secondary | ICD-10-CM | POA: Diagnosis not present

## 2024-02-28 NOTE — Progress Notes (Signed)
 Office Visit    Patient Name: Karen Mills Date of Encounter: 02/28/2024  Primary Care Provider:  Jimmey Mould, MD Primary Cardiologist:  Maudine Sos, MD  Chief Complaint    Hypertension - Advanced hypertension clinic  Past Medical History   CAD 2023 CAC 342 (92 nd percentile)  HLD 3/24 LDL 56 on rosuvastatin  20  CKD IIIa, followed by Dr. Zelda Hickman at nephrology  Breast cancer On tamoxifen  and epoetin  - both can raise BP    Allergies  Allergen Reactions   Chlorthalidone Other (See Comments)    Severe Hyponatremia   Accupril [Quinapril Hcl] Cough   Amlodipine Besylate Swelling    Leg swelling   Atacand [Candesartan] Other (See Comments)    Unknown reaction   Atorvastatin  Other (See Comments)    Joint pain   Cozaar [Losartan Potassium] Other (See Comments)    Stomach upset    Hydrochlorothiazide-Triamterene Other (See Comments)    Unknown reaction  Other Reaction(s): not helpful   Tiazac [Diltiazem Hcl Er Beads] Other (See Comments)    Unknown reaction   Zestril [Lisinopril] Cough    History of Present Illness    Karen Mills is a 68 y.o. female patient who was referred to the Advanced Hypertension Clinic by Cristi Donalds MD.  When she saw Dr. Theodis Fiscal in February her BP was noted to be 174/93.  She added clonidine  0.1 mg patch.  Patient then called into the office 2 weeks later to report home readings still elevated (180-190's/90-100's).  The clonidine  was increased to 0.2 mg and carvedilol  to 12.5 mg.  At her last visit we noted she takes tamoxifen , which could potentially be causing an increase in BP.  She started this 5 years ago and was told by oncology that she would take for 5-7 years.  She also has used epoetin  when her hemoglobin is low, but noted she has not had a dose for several months. Also noted that with her BP so high she has taken the occasional extra 6.25 mg of carvedilol  in the middle of the night.  On most of those occasions her morning  pressure was ~20 points lower systolic.  When I saw her last month, pressure was still elevated at 153/84 and options were somewhat limited.  I started her on nifedipine  xl 30 mg daily.  At follow up she was doing well and her pressures had started to come down.      Today she returns for follow up.  She is doing well and thrilled with her home BP readings. Highest reading in the past month was 141 systolic, otherwise mostly 120's with some 130's.   Feels as though she is wanting to eat more, but assured her that this is not a side effect of the BP medications.      Blood Pressure Goal:  130/80  Current Medications: carvedilol  12.5 mg bid, clonidine  0.2 mg patch Tue hs, furosemide  20 mg bid, valsartan  320 mg every day  (pm) ,nifedipine  xl 30 mg qam   Previously tried:   chlorthalidone - hyponatremia  Amlodipine - lee  ACEI - cough  Candesartan - unknown  Losartan - upset stomach  HCTZ/triamterene - not helpful  Diltiazem - unknown  Hydralazine  - stopped by nephrology  Spironolactone  - unknown, says nephrology was considering re-trial  Family Hx:   both parents had hypertension; mother had MI in her late 13's, CHF later; father had CABG in 52's, died from complications; brother (42 yrs younger) has CABG  in his late 60's; daughter healthy  Social Hx:      Tobacco: no  Alcohol: occasionally  Caffeine:  8 oz Coke in the morning, otherwise water all day, occasional un-sweet tea  Diet:  mostly home cooked meals , more breakfast and lunch - oatmeal with walnuts, or egg with english muffin; lunch salads, sandwiches, dinner is more finger food - nuts, grapes, cheeses, berries  Exercise: limited by knee and hip pain, but now doing PREP class  Home BP readings: checked twice daily, most days.  In the past month, systolic range is 112-141, with only 2 readings at 141 and multiple in the 110-120 range.    Accessory Clinical Findings    02/10/24:  SCr 1.49, eGFR 38 (from [facility])  Lab  Results  Component Value Date   CREATININE 1.07 (H) 12/09/2023   BUN 18 12/09/2023   NA 133 (L) 12/09/2023   K 4.1 12/09/2023   CL 101 12/09/2023   CO2 26 12/09/2023   Lab Results  Component Value Date   ALT 17 12/09/2023   AST 22 12/09/2023   ALKPHOS 74 12/09/2023   BILITOT 0.7 12/09/2023   No results found for: "HGBA1C"  Screening for Secondary Hypertension:      11/20/2023    3:45 PM  Causes  Drugs/Herbals Screened     - Comments high sodium diet.  limits caffaine.  rare EtOH.  No supplements  Renovascular HTN N/A  Sleep Apnea Screened     - Comments no symptoms  Thyroid  Disease Screened  Hyperaldosteronism N/A  Pheochromocytoma N/A  Cushing's Syndrome N/A  Hyperparathyroidism N/A  Coarctation of the Aorta Not Screened     - Comments unable to check bilateral BP  Compliance Screened    Relevant Labs/Studies:    Latest Ref Rng & Units 12/09/2023    8:40 AM 10/21/2023    5:09 AM 10/20/2023    3:06 PM  Basic Labs  Sodium 135 - 145 mmol/L 133  130  129   Potassium 3.5 - 5.1 mmol/L 4.1  4.6    Creatinine 0.44 - 1.00 mg/dL 1.61  0.96         Latest Ref Rng & Units 10/18/2023    2:30 PM 11/08/2022    3:36 PM  Thyroid    TSH 0.350 - 4.500 uIU/mL 2.177  3.180                 Home Medications    Current Outpatient Medications  Medication Sig Dispense Refill   allopurinol  (ZYLOPRIM ) 100 MG tablet Take 200 mg by mouth daily.     b complex vitamins capsule Take 1 capsule by mouth daily.     Biotin  10000 MCG TABS Take 10,000 mcg by mouth every morning.     carvedilol  (COREG ) 12.5 MG tablet Take 1 tablet (12.5 mg total) by mouth 2 (two) times daily. 180 tablet 3   cholecalciferol  (VITAMIN D3) 25 MCG (1000 UT) tablet Take 1,000 Units by mouth every morning.     cloNIDine  (CATAPRES  - DOSED IN MG/24 HR) 0.2 mg/24hr patch Place 1 patch (0.2 mg total) onto the skin once a week. 12 patch 3   Collagen-Vitamin C-Biotin  (COLLAGEN PO) Take 1,000 mg by mouth daily.      furosemide  (LASIX ) 20 MG tablet Take 20 mg by mouth 2 (two) times daily.     Krill Oil 1000 MG CAPS 1 capsule Orally once a day     levothyroxine  (SYNTHROID ) 100 MCG tablet Take 100 mcg  by mouth every evening.     Multiple Vitamins-Minerals (HAIR FORMULA EXTRA STRENGTH PO) 4 tablets Orally once a day     NIFEdipine  (PROCARDIA -XL/NIFEDICAL-XL) 30 MG 24 hr tablet Take 1 tablet by mouth each morning. 30 tablet 3   rosuvastatin  (CRESTOR ) 20 MG tablet Take 1 tablet (20 mg total) by mouth daily. 90 tablet 3   tamoxifen  (NOLVADEX ) 10 MG tablet TAKE ONE TABLET BY MOUTH DAILY (Patient taking differently: Take 10 mg by mouth every evening.) 90 tablet 3   valsartan  (DIOVAN ) 320 MG tablet Take 320 mg by mouth daily.     allopurinol  (ZYLOPRIM ) 300 MG tablet Take 300 mg by mouth every morning. (Patient not taking: Reported on 02/28/2024)     B Complex-C (B-COMPLEX WITH VITAMIN C) tablet Take 1 tablet by mouth daily. (Patient not taking: Reported on 02/28/2024)     Vitamin E 670 MG (1000 UT) CAPS Take 1,000 Units by mouth every morning. (Patient not taking: Reported on 02/28/2024)     No current facility-administered medications for this visit.     Assessment & Plan      Hypertension Assessment: BP is controlled in office BP 120/66 mmHg;   Tolerates carvedilol , clonidine  patch, furosemide , valsartan  and nifedipine  well without any side effects Denies SOB, palpitation, chest pain, headaches,or swelling Reiterated the importance of regular exercise and low salt diet   Plan:  Continue taking carvedilol  12.5 mg bid, clonidine  0.2 mg patch Tue hs, furosemide  20 mg bid, valsartan  320 mg every day  (pm) ,nifedipine  xl 30 mg qam Patient to keep record of BP readings with heart rate and report to us  at the next visit Patient to follow up with Dr. Theodis Fiscal in June  Labs ordered today:  none (followed closely by nephrology)   Donivan Furry PharmD CPP [facility]  [address] [place], [place] [address] [phone]

## 2024-02-28 NOTE — Patient Instructions (Addendum)
 Follow up appointment: June 18 at 9 am with Dr. Theodis Fiscal  Take your BP meds as follows: no changes to your medications today  Check your blood pressure at home daily (if able) and keep record of the readings.  Your blood pressure goal is < 130/80  To check your pressure at home you will need to:  1. Sit up in a chair, with feet flat on the floor and back supported. Do not cross your ankles or legs. 2. Rest your left arm so that the cuff is about heart level. If the cuff goes on your upper arm,  then just relax the arm on the table, arm of the chair or your lap. If you have a wrist cuff, we  suggest relaxing your wrist against your chest (think of it as Pledging the Flag with the  wrong arm).  3. Place the cuff snugly around your arm, about 1 inch above the crook of your elbow. The  cords should be inside the groove of your elbow.  4. Sit quietly, with the cuff in place, for about 5 minutes. After that 5 minutes press the power  button to start a reading. 5. Do not talk or move while the reading is taking place.  6. Record your readings on a sheet of paper. Although most cuffs have a memory, it is often  easier to see a pattern developing when the numbers are all in front of you.  7. You can repeat the reading after 1-3 minutes if it is recommended  Make sure your bladder is empty and you have not had caffeine or tobacco within the last 30 min  Always bring your blood pressure log with you to your appointments. If you have not brought your monitor in to be double checked for accuracy, please bring it to your next appointment.  You can find a list of quality blood pressure cuffs at [email or web address]  Important lifestyle changes to control high blood pressure  Intervention  Effect on the BP  Lose extra pounds and watch your waistline Weight loss is one of the most effective lifestyle changes for controlling blood pressure. If you're overweight or obese, losing even a small amount of  weight can help reduce blood pressure. Blood pressure might go down by about 1 millimeter of mercury (mm Hg) with each kilogram (about 2.2 pounds) of weight lost.  Exercise regularly As a general goal, aim for at least 30 minutes of moderate physical activity every day. Regular physical activity can lower high blood pressure by about 5 to 8 mm Hg.  Eat a healthy diet Eating a diet rich in whole grains, fruits, vegetables, and low-fat dairy products and low in saturated fat and cholesterol. A healthy diet can lower high blood pressure by up to 11 mm Hg.  Reduce salt (sodium) in your diet Even a small reduction of sodium in the diet can improve heart health and reduce high blood pressure by about 5 to 6 mm Hg.  Limit alcohol One drink equals 12 ounces of beer, 5 ounces of wine, or 1.5 ounces of 80-proof liquor.  Limiting alcohol to less than one drink a day for women or two drinks a day for men can help lower blood pressure by about 4 mm Hg.   If you have any questions or concerns please use My Chart to send questions or call the office at [phone]

## 2024-02-28 NOTE — Assessment & Plan Note (Signed)
 Assessment: BP is controlled in office BP 120/66 mmHg;   Tolerates carvedilol , clonidine  patch, furosemide , valsartan  and nifedipine  well without any side effects Denies SOB, palpitation, chest pain, headaches,or swelling Reiterated the importance of regular exercise and low salt diet   Plan:  Continue taking carvedilol  12.5 mg bid, clonidine  0.2 mg patch Tue hs, furosemide  20 mg bid, valsartan  320 mg every day  (pm) ,nifedipine  xl 30 mg qam Patient to keep record of BP readings with heart rate and report to us  at the next visit Patient to follow up with Dr. Theodis Fiscal in June  Labs ordered today:  none (followed closely by nephrology)

## 2024-03-07 DIAGNOSIS — I10 Essential (primary) hypertension: Secondary | ICD-10-CM | POA: Diagnosis not present

## 2024-03-07 DIAGNOSIS — E039 Hypothyroidism, unspecified: Secondary | ICD-10-CM | POA: Diagnosis not present

## 2024-03-07 DIAGNOSIS — N1831 Chronic kidney disease, stage 3a: Secondary | ICD-10-CM | POA: Diagnosis not present

## 2024-03-07 DIAGNOSIS — E782 Mixed hyperlipidemia: Secondary | ICD-10-CM | POA: Diagnosis not present

## 2024-03-09 ENCOUNTER — Other Ambulatory Visit: Payer: Self-pay | Admitting: Obstetrics and Gynecology

## 2024-03-09 DIAGNOSIS — Z1231 Encounter for screening mammogram for malignant neoplasm of breast: Secondary | ICD-10-CM

## 2024-03-25 ENCOUNTER — Ambulatory Visit (HOSPITAL_BASED_OUTPATIENT_CLINIC_OR_DEPARTMENT_OTHER): Payer: Medicare Other | Admitting: Cardiovascular Disease

## 2024-03-25 ENCOUNTER — Encounter (HOSPITAL_BASED_OUTPATIENT_CLINIC_OR_DEPARTMENT_OTHER): Payer: Self-pay | Admitting: Cardiovascular Disease

## 2024-03-25 VITALS — BP 115/73 | HR 59 | Ht 68.0 in | Wt 232.4 lb

## 2024-03-25 DIAGNOSIS — R609 Edema, unspecified: Secondary | ICD-10-CM

## 2024-03-25 DIAGNOSIS — R011 Cardiac murmur, unspecified: Secondary | ICD-10-CM

## 2024-03-25 NOTE — Progress Notes (Signed)
 Advanced Hypertension Clinic Initial Assessment:    Date:  03/25/2024   ID:  Karen Mills, DOB [birth year], MRN [id number]  PCP:  Jimmey Mould, MD  Cardiologist:  Maudine Sos, MD   Referring MD: Jimmey Mould, MD   CC: Hypertension  History of Present Illness:    Karen Mills is a 68 y.o. female with a hx of CAD, hypertension, hyperlipidemia, nonobstructive CAD, breast cancer on tamoxifen , gout, hypothyroidism,and chronic kidney disease 3a here for follow up.  She is struggled with multiple medications to try and get her blood pressure under control.  She last saw Lovette Rud, PA-C 05/2023 and her blood pressure was 132/62 on carvedilol  and hydralazine .  She was admitted 10/2023 for hyponatremia that was thought to be due to chlorthalidone.  This resolved with fluid restriction.  BP during that hospitalization was in the 140-170s while on irbesartan  and carvedilol .  At her initial visit she reported significant lability in her blood pressure.  She was started on clonidine  patch weekly.  She was also referred to the prep program at the [facility].  She followed up with our pharmacist 02/2024 and her clonidine  patch had been increased to 0.2 mg.  She had also been started on nifedipine .  By follow-up 02/2024 blood pressures were much better controlled.  Discussed the use of AI scribe software for clinical note transcription with the patient, who gave verbal consent to proceed.  History of Present Illness Karen Mills has a history of hypertension, managed with nifedipine , clonidine , carvedilol , and valsartan . Her blood pressure has improved significantly, now ranging from 115 to 130 mmHg over the past month, compared to previous readings of 180 to 200 mmHg. Blood pressure was particularly high in the mornings but has recently been as low as 115 mmHg.  In 2023, she experienced a massive epistaxis, leading to emergency surgery and a subsequent diagnosis of COVID-19. During this  episode, she lost a substantial amount of blood, resulting in a hemoglobin level of 8 g/dL. She received a blood transfusion and has been under the care of a hematologist, receiving shots every three months to improve her hemoglobin levels. Her hemoglobin had increased to 11 g/dL, but recent tests show it has decreased to 10.6 g/dL, just above the threshold for receiving another shot.  She experiences a lack of energy and has gained weight, acknowledging that this may contribute to her fatigue. She participated in a prep program for exercise, which she found beneficial, but has struggled to maintain a regular exercise routine since completing the program. She has a free membership at [employer] and lives close to a location but has not been attending regularly.  She mentions swelling in her ankles, which has been a persistent issue and is similar to her mother's condition. She is on furosemide  for this but is cautious due to her kidney function. The swelling improves overnight. She is mindful of her salt intake and has a family history of hypertension.  Her current medications include rosuvastatin  for cholesterol, which is well-controlled with an LDL of 66 mg/dL. She also takes clonidine , carvedilol , nifedipine , and valsartan  for blood pressure management.   Previous antihypertensives: Quinipiril Amlodipine Candesartan Losartan Hydrochlorothiazide-triamterene Diltiazem  Lisinopril Chlorthalidone- hyponatremia Hydralazine  Spironolactone  clonidine   Past Medical History:  Diagnosis Date   Breast cancer (HCC) 2020   Left Breast Cancer   CKD (chronic kidney disease)    Gout    Hyperlipidemia    Hypertension    Hypothyroidism    Megaloblastic  anemia    Obesity    Personal history of radiation therapy 2020   Left Breast Cancer   PONV (postoperative nausea and vomiting)    SCC (squamous cell carcinoma) Keratoacanthoma 10/10/2016   Right Shin (Cx3,5FU)   Squamous cell carcinoma in  situ (SCCIS) 11/15/2016   Right Lower Shin (tx p bx)   Varicose veins     Past Surgical History:  Procedure Laterality Date   ABLATION ON ENDOMETRIOSIS     BREAST LUMPECTOMY Left 04/30/2019   BREAST LUMPECTOMY WITH RADIOACTIVE SEED AND SENTINEL LYMPH NODE BIOPSY Left 04/30/2019   Procedure: LEFT BREAST LUMPECTOMY WITH RADIOACTIVE SEED AND SENTINEL LYMPH NODE BIOPSY;  Surgeon: Caralyn Chandler, MD;  Location: [facility];  Service: General;  Laterality: Left;   FOOT SURGERY     IR ANGIO EXTERNAL CAROTID SEL EXT CAROTID BILAT MOD SED  12/01/2021   IR ANGIO INTRA EXTRACRAN SEL COM CAROTID INNOMINATE BILAT MOD SED  12/01/2021   IR ANGIO VERTEBRAL SEL VERTEBRAL UNI R MOD SED  12/01/2021   IR ANGIOGRAM FOLLOW UP STUDY  12/01/2021   IR ANGIOGRAM FOLLOW UP STUDY  12/01/2021   IR ANGIOGRAM FOLLOW UP STUDY  12/01/2021   IR NEURO EACH ADD'L AFTER BASIC UNI LEFT (MS)  12/01/2021   IR NEURO EACH ADD'L AFTER BASIC UNI RIGHT (MS)  12/01/2021   IR NEURO EACH ADD'L AFTER BASIC UNI RIGHT (MS)  12/01/2021   IR TRANSCATH/EMBOLIZ  12/01/2021   IR TRANSCATH/EMBOLIZ  12/01/2021   RADIOLOGY WITH ANESTHESIA N/A 12/01/2021   Procedure: RADIOLOGY WITH ANESTHESIA;  Surgeon: Luellen Sages, MD;  Location: [facility] OR;  Service: Radiology;  Laterality: N/A;   TONSILLECTOMY     vein removal      Current Medications: Current Meds  Medication Sig   allopurinol  (ZYLOPRIM ) 100 MG tablet Take 200 mg by mouth daily.   b complex vitamins capsule Take 1 capsule by mouth daily.   B Complex-C (B-COMPLEX WITH VITAMIN C) tablet Take 1 tablet by mouth daily.   Biotin  10000 MCG TABS Take 10,000 mcg by mouth every morning.   carvedilol  (COREG ) 12.5 MG tablet Take 1 tablet (12.5 mg total) by mouth 2 (two) times daily.   cholecalciferol  (VITAMIN D3) 25 MCG (1000 UT) tablet Take 1,000 Units by mouth every morning.   cloNIDine  (CATAPRES  - DOSED IN MG/24 HR) 0.2 mg/24hr patch Place 1 patch (0.2 mg total) onto the skin once a week.    Collagen-Vitamin C-Biotin  (COLLAGEN PO) Take 1,000 mg by mouth daily.   furosemide  (LASIX ) 20 MG tablet Take 20 mg by mouth 2 (two) times daily.   Krill Oil 1000 MG CAPS 1 capsule Orally once a day   levothyroxine  (SYNTHROID ) 100 MCG tablet Take 100 mcg by mouth every evening.   Multiple Vitamins-Minerals (HAIR FORMULA EXTRA STRENGTH PO) 4 tablets Orally once a day   NIFEdipine  (PROCARDIA -XL/NIFEDICAL-XL) 30 MG 24 hr tablet Take 1 tablet by mouth each morning.   rosuvastatin  (CRESTOR ) 20 MG tablet Take 1 tablet (20 mg total) by mouth daily.   tamoxifen  (NOLVADEX ) 10 MG tablet TAKE ONE TABLET BY MOUTH DAILY (Patient taking differently: Take 10 mg by mouth every evening.)   valsartan  (DIOVAN ) 320 MG tablet Take 320 mg by mouth daily.     Allergies:   Chlorthalidone, Accupril [quinapril hcl], Amlodipine besylate, Atacand [candesartan], Atorvastatin , Cozaar [losartan potassium], Hydrochlorothiazide-triamterene, Tiazac [diltiazem hcl er beads], and Zestril [lisinopril]   Social History   Socioeconomic History   Marital status: Married  Spouse name: Not on file   Number of children: Not on file   Years of education: Not on file   Highest education level: Not on file  Occupational History   Occupation: retired  Tobacco Use   Smoking status: Never   Smokeless tobacco: Never  Vaping Use   Vaping status: Never Used  Substance and Sexual Activity   Alcohol use: Yes    Comment: 2-3x per week   Drug use: No   Sexual activity: Not on file  Other Topics Concern   Not on file  Social History Narrative   Not on file   Social Drivers of Health   Financial Resource Strain: Not on file  Food Insecurity: No Food Insecurity (10/18/2023)   Hunger Vital Sign    Worried About Running Out of Food in the Last Year: Never true    Ran Out of Food in the Last Year: Never true  Transportation Needs: No Transportation Needs (10/18/2023)   PRAPARE - Administrator, Civil Service  (Medical): No    Lack of Transportation (Non-Medical): No  Physical Activity: Not on file  Stress: Not on file  Social Connections: Moderately Integrated (10/18/2023)   Social Connection and Isolation Panel    Frequency of Communication with Friends and Family: More than three times a week    Frequency of Social Gatherings with Friends and Family: Twice a week    Attends Religious Services: 1 to 4 times per year    Active Member of [employer] or Organizations: No    Attends Engineer, structural: Never    Marital Status: Married     Family History: The patient's family history includes Heart disease in her brother, father, and mother; Heart failure in her mother; Pancreatic cancer in her half-brother.  ROS:   Please see the history of present illness.    All other systems reviewed and are negative.  EKGs/Labs/Other Studies Reviewed:    EKG:  EKG is ordered today.    Recent Labs: 10/18/2023: TSH 2.177 10/19/2023: Magnesium  2.0 12/09/2023: ALT 17; BUN 18; Creatinine 1.07; Potassium 4.1; Sodium 133 02/25/2024: Hemoglobin 10.6; Platelet Count 306   Recent Lipid Panel    Component Value Date/Time   CHOL 110 12/17/2022 0952   TRIG 99 12/17/2022 0952   HDL 35 (L) 12/17/2022 0952   CHOLHDL 3.1 12/17/2022 0952   LDLCALC 56 12/17/2022 0952    Physical Exam:   VS:  BP 115/73 (BP Location: Right Arm, Patient Position: Sitting, Cuff Size: Large)   Pulse (!) 59   Ht 5' 8 (1.727 m)   Wt 232 lb 6.4 oz (105.4 kg)   SpO2 99%   BMI 35.34 kg/m  , BMI Body mass index is 35.34 kg/m. GENERAL:  Well appearing HEENT: Pupils equal round and reactive, fundi not visualized, oral mucosa unremarkable NECK:  No jugular venous distention, waveform within normal limits, carotid upstroke brisk and symmetric, no bruits, no thyromegaly LUNGS:  Clear to auscultation bilaterally HEART:  RRR.  PMI not displaced or sustained, S1 and S2 within normal limits, no S3, no S4, no clicks, no rubs, II/VI  systolic murmur at the LUSB ABD:  Flat, positive bowel sounds normal in frequency in pitch, no bruits, no rebound, no guarding, no midline pulsatile mass, no hepatomegaly, no splenomegaly EXT:  2 plus pulses throughout, no edema, no cyanosis no clubbing SKIN:  No rashes no nodules NEURO:  Cranial nerves II through XII grossly intact, motor grossly intact throughout PSYCH:  Cognitively intact, oriented to person place and time   ASSESSMENT/PLAN:    Assessment & Plan # Hypertension Well-controlled with clonidine , carvedilol , nifedipine , and valsartan . Blood pressure improved to 110s-130s. - Continue clonidine , carvedilol , nifedipine , and valsartan .  # Peripheral edema Likely multifactorial, with nifedipine  contributing. Edema improves overnight. Echocardiogram planned to rule out cardiac causes. - Order echocardiogram to assess cardiac function. - Recommend compression socks. - Encourage regular exercise.  # Non-obstructive CAD:  # Hyperlipidemia; Lipids are controlled.  LDL 66.  Continue rosuvastatin .   # Systolic murmur Echo pending  # Anemia Chronic anemia with hemoglobin at 10.6. Reports decreased energy possibly related to anemia. - Monitor hemoglobin levels. - Continue follow up with hematologist.   Screening for Secondary Hypertension:     11/20/2023    3:45 PM  Causes  Drugs/Herbals Screened     - Comments high sodium diet.  limits caffaine.  rare EtOH.  No supplements  Renovascular HTN N/A  Sleep Apnea Screened     - Comments no symptoms  Thyroid  Disease Screened  Hyperaldosteronism N/A  Pheochromocytoma N/A  Cushing's Syndrome N/A  Hyperparathyroidism N/A  Coarctation of the Aorta Not Screened     - Comments unable to check bilateral BP  Compliance Screened    Relevant Labs/Studies:    Latest Ref Rng & Units 12/09/2023    8:40 AM 10/21/2023    5:09 AM 10/20/2023    3:06 PM  Basic Labs  Sodium 135 - 145 mmol/L 133  130  129   Potassium 3.5 - 5.1 mmol/L  4.1  4.6    Creatinine 0.44 - 1.00 mg/dL 1.61  0.96         Latest Ref Rng & Units 10/18/2023    2:30 PM 11/08/2022    3:36 PM  Thyroid    TSH 0.350 - 4.500 uIU/mL 2.177  3.180      Disposition:    FU with MD/PharmD in 6 months   Medication Adjustments/Labs and Tests Ordered: Current medicines are reviewed at length with the patient today.  Concerns regarding medicines are outlined above.  Orders Placed This Encounter  Procedures   ECHOCARDIOGRAM COMPLETE   No orders of the defined types were placed in this encounter.    Signed, Maudine Sos, MD  03/25/2024 9:17 AM    [facility]

## 2024-03-25 NOTE — Patient Instructions (Addendum)
 Medication Instructions:  Your physician recommends that you continue on your current medications as directed. Please refer to the Current Medication list given to you today.   *If you need a refill on your cardiac medications before your next appointment, please call your pharmacy*  Lab Work: NONE  Testing/Procedures: Your physician has requested that you have an echocardiogram. Echocardiography is a painless test that uses sound waves to create images of your heart. It provides your doctor with information about the size and shape of your heart and how well your heart's chambers and valves are working. This procedure takes approximately one hour. There are no restrictions for this procedure. Please do NOT wear cologne, perfume, aftershave, or lotions (deodorant is allowed). Please arrive 15 minutes prior to your appointment time.  Please note: We ask at that you not bring children with you during ultrasound (echo/ vascular) testing. Due to room size and safety concerns, children are not allowed in the ultrasound rooms during exams. Our front office staff cannot provide observation of children in our lobby area while testing is being conducted. An adult accompanying a patient to their appointment will only be allowed in the ultrasound room at the discretion of the ultrasound technician under special circumstances. We apologize for any inconvenience.  Follow-Up: At [facility], you and your health needs are our priority.  As part of our continuing mission to provide you with exceptional heart care, our providers are all part of one team.  This team includes your primary Cardiologist (physician) and Advanced Practice Providers or APPs (Physician Assistants and Nurse Practitioners) who all work together to provide you with the care you need, when you need it.  Your next appointment:   6 month(s)  Provider:   Maudine Sos, MD, Slater Duncan, NP, or Neomi Banks, NP    We  recommend signing up for the patient portal called "MyChart".  Sign up information is provided on this After Visit Summary.  MyChart is used to connect with patients for Virtual Visits (Telemedicine).  Patients are able to view lab/test results, encounter notes, upcoming appointments, etc.  Non-urgent messages can be sent to your provider as well.   To learn more about what you can do with MyChart, go to [email or web address].

## 2024-04-04 ENCOUNTER — Encounter (HOSPITAL_COMMUNITY): Payer: Self-pay | Admitting: Interventional Radiology

## 2024-04-08 DIAGNOSIS — M25561 Pain in right knee: Secondary | ICD-10-CM | POA: Diagnosis not present

## 2024-04-08 DIAGNOSIS — M25512 Pain in left shoulder: Secondary | ICD-10-CM | POA: Diagnosis not present

## 2024-04-09 ENCOUNTER — Ambulatory Visit
Admission: RE | Admit: 2024-04-09 | Discharge: 2024-04-09 | Disposition: A | Discharge status: Home / Self Care | Source: Ambulatory Visit | Attending: Obstetrics and Gynecology | Admitting: Obstetrics and Gynecology

## 2024-04-09 DIAGNOSIS — Z1231 Encounter for screening mammogram for malignant neoplasm of breast: Secondary | ICD-10-CM

## 2024-04-13 ENCOUNTER — Inpatient Hospital Stay: Payer: Medicare Other | Attending: Hematology and Oncology

## 2024-04-13 DIAGNOSIS — Z1721 Progesterone receptor positive status: Secondary | ICD-10-CM | POA: Diagnosis not present

## 2024-04-13 DIAGNOSIS — C50512 Malignant neoplasm of lower-outer quadrant of left female breast: Secondary | ICD-10-CM | POA: Insufficient documentation

## 2024-04-13 DIAGNOSIS — Z1732 Human epidermal growth factor receptor 2 negative status: Secondary | ICD-10-CM | POA: Diagnosis not present

## 2024-04-13 DIAGNOSIS — Z170 Estrogen receptor positive status [ER+]: Secondary | ICD-10-CM | POA: Diagnosis not present

## 2024-04-13 DIAGNOSIS — D649 Anemia, unspecified: Secondary | ICD-10-CM

## 2024-04-13 LAB — CBC WITH DIFFERENTIAL (CANCER CENTER ONLY)
Abs Immature Granulocytes: 0.11 K/uL — ABNORMAL HIGH (ref 0.00–0.07)
Basophils Absolute: 0.1 K/uL (ref 0.0–0.1)
Basophils Relative: 1 %
Eosinophils Absolute: 0.1 K/uL (ref 0.0–0.5)
Eosinophils Relative: 1 %
HCT: 32.1 % — ABNORMAL LOW (ref 36.0–46.0)
Hemoglobin: 11.2 g/dL — ABNORMAL LOW (ref 12.0–15.0)
Immature Granulocytes: 1 %
Lymphocytes Relative: 17 %
Lymphs Abs: 2 K/uL (ref 0.7–4.0)
MCH: 33.9 pg (ref 26.0–34.0)
MCHC: 34.9 g/dL (ref 30.0–36.0)
MCV: 97.3 fL (ref 80.0–100.0)
Monocytes Absolute: 1 K/uL (ref 0.1–1.0)
Monocytes Relative: 9 %
Neutro Abs: 8.7 K/uL — ABNORMAL HIGH (ref 1.7–7.7)
Neutrophils Relative %: 71 %
Platelet Count: 332 K/uL (ref 150–400)
RBC: 3.3 MIL/uL — ABNORMAL LOW (ref 3.87–5.11)
RDW: 12.9 % (ref 11.5–15.5)
WBC Count: 12 K/uL — ABNORMAL HIGH (ref 4.0–10.5)
nRBC: 0 % (ref 0.0–0.2)

## 2024-04-23 ENCOUNTER — Other Ambulatory Visit (HOSPITAL_BASED_OUTPATIENT_CLINIC_OR_DEPARTMENT_OTHER): Payer: Self-pay | Admitting: Cardiovascular Disease

## 2024-04-23 ENCOUNTER — Ambulatory Visit (INDEPENDENT_AMBULATORY_CARE_PROVIDER_SITE_OTHER)

## 2024-04-23 DIAGNOSIS — R609 Edema, unspecified: Secondary | ICD-10-CM

## 2024-04-23 DIAGNOSIS — R011 Cardiac murmur, unspecified: Secondary | ICD-10-CM

## 2024-04-23 LAB — ECHOCARDIOGRAM COMPLETE
AR max vel: 1.14 cm2
AV Area VTI: 1.16 cm2
AV Area mean vel: 1.1 cm2
AV Mean grad: 15 mmHg
AV Peak grad: 27.2 mmHg
Ao pk vel: 2.61 m/s
Area-P 1/2: 2.5 cm2
S' Lateral: 2.96 cm

## 2024-04-24 DIAGNOSIS — M7542 Impingement syndrome of left shoulder: Secondary | ICD-10-CM | POA: Diagnosis not present

## 2024-04-24 DIAGNOSIS — M25612 Stiffness of left shoulder, not elsewhere classified: Secondary | ICD-10-CM | POA: Diagnosis not present

## 2024-04-24 DIAGNOSIS — M6281 Muscle weakness (generalized): Secondary | ICD-10-CM | POA: Diagnosis not present

## 2024-04-24 DIAGNOSIS — S46012D Strain of muscle(s) and tendon(s) of the rotator cuff of left shoulder, subsequent encounter: Secondary | ICD-10-CM | POA: Diagnosis not present

## 2024-04-27 ENCOUNTER — Ambulatory Visit: Payer: Self-pay | Admitting: Cardiovascular Disease

## 2024-04-29 DIAGNOSIS — M7542 Impingement syndrome of left shoulder: Secondary | ICD-10-CM | POA: Diagnosis not present

## 2024-04-29 DIAGNOSIS — M25612 Stiffness of left shoulder, not elsewhere classified: Secondary | ICD-10-CM | POA: Diagnosis not present

## 2024-04-29 DIAGNOSIS — S46012D Strain of muscle(s) and tendon(s) of the rotator cuff of left shoulder, subsequent encounter: Secondary | ICD-10-CM | POA: Diagnosis not present

## 2024-04-29 DIAGNOSIS — M6281 Muscle weakness (generalized): Secondary | ICD-10-CM | POA: Diagnosis not present

## 2024-05-05 DIAGNOSIS — N1831 Chronic kidney disease, stage 3a: Secondary | ICD-10-CM | POA: Diagnosis not present

## 2024-05-07 ENCOUNTER — Other Ambulatory Visit: Payer: Self-pay | Admitting: Hematology and Oncology

## 2024-05-07 DIAGNOSIS — N1831 Chronic kidney disease, stage 3a: Secondary | ICD-10-CM | POA: Diagnosis not present

## 2024-05-07 DIAGNOSIS — E782 Mixed hyperlipidemia: Secondary | ICD-10-CM | POA: Diagnosis not present

## 2024-05-07 DIAGNOSIS — I10 Essential (primary) hypertension: Secondary | ICD-10-CM | POA: Diagnosis not present

## 2024-05-07 DIAGNOSIS — E039 Hypothyroidism, unspecified: Secondary | ICD-10-CM | POA: Diagnosis not present

## 2024-05-12 DIAGNOSIS — S46012D Strain of muscle(s) and tendon(s) of the rotator cuff of left shoulder, subsequent encounter: Secondary | ICD-10-CM | POA: Diagnosis not present

## 2024-05-12 DIAGNOSIS — M6281 Muscle weakness (generalized): Secondary | ICD-10-CM | POA: Diagnosis not present

## 2024-05-12 DIAGNOSIS — M25612 Stiffness of left shoulder, not elsewhere classified: Secondary | ICD-10-CM | POA: Diagnosis not present

## 2024-05-12 DIAGNOSIS — M7542 Impingement syndrome of left shoulder: Secondary | ICD-10-CM | POA: Diagnosis not present

## 2024-05-18 DIAGNOSIS — M25612 Stiffness of left shoulder, not elsewhere classified: Secondary | ICD-10-CM | POA: Diagnosis not present

## 2024-05-18 DIAGNOSIS — M7542 Impingement syndrome of left shoulder: Secondary | ICD-10-CM | POA: Diagnosis not present

## 2024-05-18 DIAGNOSIS — S46012D Strain of muscle(s) and tendon(s) of the rotator cuff of left shoulder, subsequent encounter: Secondary | ICD-10-CM | POA: Diagnosis not present

## 2024-05-18 DIAGNOSIS — M6281 Muscle weakness (generalized): Secondary | ICD-10-CM | POA: Diagnosis not present

## 2024-05-22 DIAGNOSIS — N2581 Secondary hyperparathyroidism of renal origin: Secondary | ICD-10-CM | POA: Diagnosis not present

## 2024-05-22 DIAGNOSIS — R809 Proteinuria, unspecified: Secondary | ICD-10-CM | POA: Diagnosis not present

## 2024-05-22 DIAGNOSIS — I129 Hypertensive chronic kidney disease with stage 1 through stage 4 chronic kidney disease, or unspecified chronic kidney disease: Secondary | ICD-10-CM | POA: Diagnosis not present

## 2024-05-22 DIAGNOSIS — N1831 Chronic kidney disease, stage 3a: Secondary | ICD-10-CM | POA: Diagnosis not present

## 2024-05-28 DIAGNOSIS — M7542 Impingement syndrome of left shoulder: Secondary | ICD-10-CM | POA: Diagnosis not present

## 2024-05-28 DIAGNOSIS — S46012D Strain of muscle(s) and tendon(s) of the rotator cuff of left shoulder, subsequent encounter: Secondary | ICD-10-CM | POA: Diagnosis not present

## 2024-05-28 DIAGNOSIS — M25612 Stiffness of left shoulder, not elsewhere classified: Secondary | ICD-10-CM | POA: Diagnosis not present

## 2024-05-28 DIAGNOSIS — M6281 Muscle weakness (generalized): Secondary | ICD-10-CM | POA: Diagnosis not present

## 2024-06-02 DIAGNOSIS — H0014 Chalazion left upper eyelid: Secondary | ICD-10-CM | POA: Diagnosis not present

## 2024-06-03 DIAGNOSIS — M170 Bilateral primary osteoarthritis of knee: Secondary | ICD-10-CM | POA: Diagnosis not present

## 2024-06-09 DIAGNOSIS — H0014 Chalazion left upper eyelid: Secondary | ICD-10-CM | POA: Diagnosis not present

## 2024-06-12 DIAGNOSIS — M25512 Pain in left shoulder: Secondary | ICD-10-CM | POA: Diagnosis not present

## 2024-06-15 ENCOUNTER — Inpatient Hospital Stay: Payer: Medicare Other | Attending: Hematology and Oncology

## 2024-06-15 DIAGNOSIS — Z170 Estrogen receptor positive status [ER+]: Secondary | ICD-10-CM | POA: Diagnosis not present

## 2024-06-15 DIAGNOSIS — Z1732 Human epidermal growth factor receptor 2 negative status: Secondary | ICD-10-CM | POA: Diagnosis not present

## 2024-06-15 DIAGNOSIS — D649 Anemia, unspecified: Secondary | ICD-10-CM

## 2024-06-15 DIAGNOSIS — Z1721 Progesterone receptor positive status: Secondary | ICD-10-CM | POA: Insufficient documentation

## 2024-06-15 DIAGNOSIS — C50512 Malignant neoplasm of lower-outer quadrant of left female breast: Secondary | ICD-10-CM | POA: Insufficient documentation

## 2024-06-15 LAB — CBC WITH DIFFERENTIAL (CANCER CENTER ONLY)
Abs Immature Granulocytes: 0.09 K/uL — ABNORMAL HIGH (ref 0.00–0.07)
Basophils Absolute: 0.1 K/uL (ref 0.0–0.1)
Basophils Relative: 1 %
Eosinophils Absolute: 0.2 K/uL (ref 0.0–0.5)
Eosinophils Relative: 2 %
HCT: 32.9 % — ABNORMAL LOW (ref 36.0–46.0)
Hemoglobin: 11.3 g/dL — ABNORMAL LOW (ref 12.0–15.0)
Immature Granulocytes: 1 %
Lymphocytes Relative: 21 %
Lymphs Abs: 1.9 K/uL (ref 0.7–4.0)
MCH: 34.3 pg — ABNORMAL HIGH (ref 26.0–34.0)
MCHC: 34.3 g/dL (ref 30.0–36.0)
MCV: 100 fL (ref 80.0–100.0)
Monocytes Absolute: 0.8 K/uL (ref 0.1–1.0)
Monocytes Relative: 9 %
Neutro Abs: 6.3 K/uL (ref 1.7–7.7)
Neutrophils Relative %: 66 %
Platelet Count: 327 K/uL (ref 150–400)
RBC: 3.29 MIL/uL — ABNORMAL LOW (ref 3.87–5.11)
RDW: 13.6 % (ref 11.5–15.5)
WBC Count: 9.4 K/uL (ref 4.0–10.5)
nRBC: 0 % (ref 0.0–0.2)

## 2024-06-22 ENCOUNTER — Ambulatory Visit: Admitting: Podiatry

## 2024-06-22 ENCOUNTER — Ambulatory Visit

## 2024-06-22 VITALS — Ht 68.0 in | Wt 232.4 lb

## 2024-06-22 DIAGNOSIS — S92352D Displaced fracture of fifth metatarsal bone, left foot, subsequent encounter for fracture with routine healing: Secondary | ICD-10-CM | POA: Diagnosis not present

## 2024-06-22 DIAGNOSIS — M7752 Other enthesopathy of left foot: Secondary | ICD-10-CM | POA: Diagnosis not present

## 2024-06-23 NOTE — Progress Notes (Signed)
 Chief Complaint  Patient presents with   Foot Pain    Pt is here due to left foot pain, states she fell Sunday, and foot has been hurting her since, foot is swollen states hard to walk on.    HPI: 68 y.o. female presenting today for new complaint of pain and tenderness associated to the left foot after a fall injury.  Fell at her home.  Denies LOC.  DOI: 06/21/2024.  Past Medical History:  Diagnosis Date   Breast cancer (HCC) 2020   Left Breast Cancer   CKD (chronic kidney disease)    Gout    Hyperlipidemia    Hypertension    Hypothyroidism    Megaloblastic anemia    Obesity    Personal history of radiation therapy 2020   Left Breast Cancer   PONV (postoperative nausea and vomiting)    SCC (squamous cell carcinoma) Keratoacanthoma 10/10/2016   Right Shin (Cx3,5FU)   Squamous cell carcinoma in situ (SCCIS) 11/15/2016   Right Lower Shin (tx p bx)   Varicose veins     Past Surgical History:  Procedure Laterality Date   ABLATION ON ENDOMETRIOSIS     BREAST LUMPECTOMY Left 04/30/2019   BREAST LUMPECTOMY WITH RADIOACTIVE SEED AND SENTINEL LYMPH NODE BIOPSY Left 04/30/2019   Procedure: LEFT BREAST LUMPECTOMY WITH RADIOACTIVE SEED AND SENTINEL LYMPH NODE BIOPSY;  Surgeon: Curvin Deward MOULD, MD;  Location: [facility];  Service: General;  Laterality: Left;   FOOT SURGERY     IR ANGIO EXTERNAL CAROTID SEL EXT CAROTID BILAT MOD SED  12/01/2021   IR ANGIO INTRA EXTRACRAN SEL COM CAROTID INNOMINATE BILAT MOD SED  12/01/2021   IR ANGIO VERTEBRAL SEL VERTEBRAL UNI R MOD SED  12/01/2021   IR ANGIOGRAM FOLLOW UP STUDY  12/01/2021   IR ANGIOGRAM FOLLOW UP STUDY  12/01/2021   IR ANGIOGRAM FOLLOW UP STUDY  12/01/2021   IR NEURO EACH ADD'L AFTER BASIC UNI LEFT (MS)  12/01/2021   IR NEURO EACH ADD'L AFTER BASIC UNI RIGHT (MS)  12/01/2021   IR NEURO EACH ADD'L AFTER BASIC UNI RIGHT (MS)  12/01/2021   IR TRANSCATH/EMBOLIZ  12/01/2021   IR TRANSCATH/EMBOLIZ  12/01/2021   RADIOLOGY WITH  ANESTHESIA N/A 12/01/2021   Procedure: RADIOLOGY WITH ANESTHESIA;  Surgeon: Dolphus Carrion, MD;  Location: [facility];  Service: Radiology;  Laterality: N/A;   TONSILLECTOMY     vein removal      Allergies  Allergen Reactions   Chlorthalidone Other (See Comments)    Severe Hyponatremia   Accupril [Quinapril Hcl] Cough   Amlodipine Besylate Swelling    Leg swelling   Atacand [Candesartan] Other (See Comments)    Unknown reaction   Atorvastatin  Other (See Comments)    Joint pain   Cozaar [Losartan Potassium] Other (See Comments)    Stomach upset    Hydrochlorothiazide-Triamterene Other (See Comments)    Unknown reaction  Other Reaction(s): not helpful   Tiazac [Diltiazem Hcl Er Beads] Other (See Comments)    Unknown reaction   Zestril [Lisinopril] Cough     Physical Exam: General: The patient is alert and oriented x3 in no acute distress.  Dermatology: Skin is warm, dry and supple bilateral lower extremities.   Vascular: Palpable pedal pulses bilaterally. Capillary refill within normal limits.  No appreciable edema.  No erythema.  Neurological: Grossly intact via light touch  Musculoskeletal Exam: No pedal deformities noted.  Ecchymosis with edema and tenderness around the lateral column of the left  foot  Radiographic Exam LT foot 06/23/2024:  Normal osseous mineralization. Joint spaces preserved.  Transverse nondisplaced fracture noted at the metaphyseal diaphyseal junction of the proximal portion of the fifth metatarsal left foot  Assessment/Plan of Care: 1.  Jones fracture left fifth metatarsal, closed, nondisplaced, initial encounter.  DOI: 06/21/2024  -Patient evaluated.  X-rays reviewed consistent with Jones fracture -Cam boot dispensed.  Minimal WBAT -RICE -Explained to the patient that it will take minimum 6-8 weeks to allow for the fracture to stabilize before she is able to get out of the cam boot.  She understands -Return to clinic 6 weeks follow-up x-ray      Thresa EMERSON Sar, DPM [facility]  Dr. Thresa EMERSON Sar, DPM    [address] [place], [place] [address]                Office [phone]  Fax [phone]

## 2024-07-03 DIAGNOSIS — M170 Bilateral primary osteoarthritis of knee: Secondary | ICD-10-CM | POA: Diagnosis not present

## 2024-07-03 DIAGNOSIS — S43432A Superior glenoid labrum lesion of left shoulder, initial encounter: Secondary | ICD-10-CM | POA: Diagnosis not present

## 2024-07-06 ENCOUNTER — Ambulatory Visit: Admitting: Podiatry

## 2024-07-08 DIAGNOSIS — M170 Bilateral primary osteoarthritis of knee: Secondary | ICD-10-CM | POA: Diagnosis not present

## 2024-07-16 DIAGNOSIS — M67912 Unspecified disorder of synovium and tendon, left shoulder: Secondary | ICD-10-CM | POA: Diagnosis not present

## 2024-07-22 DIAGNOSIS — M170 Bilateral primary osteoarthritis of knee: Secondary | ICD-10-CM | POA: Diagnosis not present

## 2024-08-03 ENCOUNTER — Ambulatory Visit: Admitting: Podiatry

## 2024-08-05 ENCOUNTER — Ambulatory Visit: Admitting: Podiatry

## 2024-08-05 ENCOUNTER — Encounter: Payer: Self-pay | Admitting: Podiatry

## 2024-08-05 ENCOUNTER — Ambulatory Visit (INDEPENDENT_AMBULATORY_CARE_PROVIDER_SITE_OTHER)

## 2024-08-05 ENCOUNTER — Telehealth: Payer: Self-pay | Admitting: Podiatry

## 2024-08-05 VITALS — Ht 68.0 in | Wt 232.4 lb

## 2024-08-05 DIAGNOSIS — S92352D Displaced fracture of fifth metatarsal bone, left foot, subsequent encounter for fracture with routine healing: Secondary | ICD-10-CM

## 2024-08-05 NOTE — Telephone Encounter (Signed)
 Pt forgot to get  paper for handicap sticker  Pt is willing to come and pick up papers please call [phone]  pt. when paper are ready

## 2024-08-05 NOTE — Progress Notes (Signed)
 Chief Complaint  Patient presents with   Foot Injury    Pt is here to f/u on left foot after fracture to the fifth metatarsal bone, she states that her foot feels a lot better then before, has no other complaints.    HPI: 68 y.o. female presenting today for follow-up evaluation of Jones fracture left foot.  Doing well.  No new complaints.   Brief history: Pain and tenderness associated to the left foot after a fall injury.  Fell at her home.  Denies LOC.  DOI: 06/21/2024.  Past Medical History:  Diagnosis Date   Breast cancer (HCC) 2020   Left Breast Cancer   CKD (chronic kidney disease)    Gout    Hyperlipidemia    Hypertension    Hypothyroidism    Megaloblastic anemia    Obesity    Personal history of radiation therapy 2020   Left Breast Cancer   PONV (postoperative nausea and vomiting)    SCC (squamous cell carcinoma) Keratoacanthoma 10/10/2016   Right Shin (Cx3,5FU)   Squamous cell carcinoma in situ (SCCIS) 11/15/2016   Right Lower Shin (tx p bx)   Varicose veins     Past Surgical History:  Procedure Laterality Date   ABLATION ON ENDOMETRIOSIS     BREAST LUMPECTOMY Left 04/30/2019   BREAST LUMPECTOMY WITH RADIOACTIVE SEED AND SENTINEL LYMPH NODE BIOPSY Left 04/30/2019   Procedure: LEFT BREAST LUMPECTOMY WITH RADIOACTIVE SEED AND SENTINEL LYMPH NODE BIOPSY;  Surgeon: Curvin Deward MOULD, MD;  Location: [facility];  Service: General;  Laterality: Left;   FOOT SURGERY     IR ANGIO EXTERNAL CAROTID SEL EXT CAROTID BILAT MOD SED  12/01/2021   IR ANGIO INTRA EXTRACRAN SEL COM CAROTID INNOMINATE BILAT MOD SED  12/01/2021   IR ANGIO VERTEBRAL SEL VERTEBRAL UNI R MOD SED  12/01/2021   IR ANGIOGRAM FOLLOW UP STUDY  12/01/2021   IR ANGIOGRAM FOLLOW UP STUDY  12/01/2021   IR ANGIOGRAM FOLLOW UP STUDY  12/01/2021   IR NEURO EACH ADD'L AFTER BASIC UNI LEFT (MS)  12/01/2021   IR NEURO EACH ADD'L AFTER BASIC UNI RIGHT (MS)  12/01/2021   IR NEURO EACH ADD'L AFTER BASIC UNI RIGHT  (MS)  12/01/2021   IR TRANSCATH/EMBOLIZ  12/01/2021   IR TRANSCATH/EMBOLIZ  12/01/2021   RADIOLOGY WITH ANESTHESIA N/A 12/01/2021   Procedure: RADIOLOGY WITH ANESTHESIA;  Surgeon: Dolphus Carrion, MD;  Location: [facility];  Service: Radiology;  Laterality: N/A;   TONSILLECTOMY     vein removal      Allergies  Allergen Reactions   Chlorthalidone Other (See Comments)    Severe Hyponatremia   Accupril [Quinapril Hcl] Cough   Amlodipine Besylate Swelling    Leg swelling   Atacand [Candesartan] Other (See Comments)    Unknown reaction   Atorvastatin  Other (See Comments)    Joint pain   Cozaar [Losartan Potassium] Other (See Comments)    Stomach upset    Hydrochlorothiazide-Triamterene Other (See Comments)    Unknown reaction  Other Reaction(s): not helpful   Tiazac [Diltiazem Hcl Er Beads] Other (See Comments)    Unknown reaction   Zestril [Lisinopril] Cough     Physical Exam: General: The patient is alert and oriented x3 in no acute distress.  Dermatology: Skin is warm, dry and supple bilateral lower extremities.   Vascular: Palpable pedal pulses bilaterally. Capillary refill within normal limits.  No appreciable edema.  No erythema.  Neurological: Grossly intact via light touch  Musculoskeletal  Exam: No pedal deformities noted.  Ecchymosis with edema and tenderness around the lateral column of the left foot  Radiographic Exam LT foot 08/05/2024:  Osseous union and callus formation noted across the fracture site of the fifth metatarsal.  Continues to be in rectus alignment; nondisplaced.  Routine healing noted.  Assessment/Plan of Care: 1.  Jones fracture left fifth metatarsal, closed, nondisplaced, initial encounter.  DOI: 06/21/2024  -Patient evaluated.   -Discontinue cam boot.  Postop shoe dispensed.  WBAT in additional 4 weeks.  After that the patient may slowly transition out of the postop shoe into good supportive tennis shoes and sneakers -Compression ankle sleeve  dispensed.  Wear daily -Return to clinic 6 weeks follow-up x-ray     Thresa EMERSON Sar, DPM [facility]  Dr. Thresa EMERSON Sar, DPM    [address] [place], [place] [address]                Office [phone]  Fax [phone]

## 2024-08-06 DIAGNOSIS — K080 Exfoliation of teeth due to systemic causes: Secondary | ICD-10-CM | POA: Diagnosis not present

## 2024-08-12 DIAGNOSIS — N1831 Chronic kidney disease, stage 3a: Secondary | ICD-10-CM | POA: Diagnosis not present

## 2024-08-18 DIAGNOSIS — N1832 Chronic kidney disease, stage 3b: Secondary | ICD-10-CM | POA: Diagnosis not present

## 2024-08-18 DIAGNOSIS — N2581 Secondary hyperparathyroidism of renal origin: Secondary | ICD-10-CM | POA: Diagnosis not present

## 2024-08-18 DIAGNOSIS — I129 Hypertensive chronic kidney disease with stage 1 through stage 4 chronic kidney disease, or unspecified chronic kidney disease: Secondary | ICD-10-CM | POA: Diagnosis not present

## 2024-08-18 DIAGNOSIS — R809 Proteinuria, unspecified: Secondary | ICD-10-CM | POA: Diagnosis not present

## 2024-09-09 NOTE — Assessment & Plan Note (Signed)
 Hospitalization 10/18/2023-10/21/2023: Severe hyponatremia: Resolved with fluid restriction Lab review: 12/12/2022: Ferritin 193, iron saturation 13%, hemoglobin 9.5, creatinine 1.42 01/15/2023: Hemoglobin 9.4, MCV 98.3, platelets 317 02/12/2023: Hemoglobin 9.7 03/27/2023: Hemoglobin 10 05/07/2023: Hemoglobin 10.5 (proceed with today's injection) 05/28/2023: Hemoglobin 10.3-Retacrit  06/19/2023: Hemoglobin 10.4-Retacrit  06/15/2024: Hemoglobin 11.3   Anemia of chronic kidney disease stage II-III. Current treatment: Retacrit  40,000 units every 3 weeks started 01/22/2023 (last injection was on 06/18/2023) Goal hemoglobin: 10.5 g

## 2024-09-09 NOTE — Assessment & Plan Note (Signed)
 04/15/2019:Screening mammogram detected 0.4cm mass in the left breast at the 6 o'clock position with no left axillary adenopathy. Biopsy on 04/03/19 showed IDC, grade 1-2, HER-2 - (0), ER +95%, PR+ 95%, Ki67 15%.  T1 a N0 stage Ia clinical stage   Treatment plan: 1.  Breast conserving surgery with sentinel lymph node biopsy 04/30/2019: Grade 1 IDC 0.8 cm, 0/3 lymph nodes, ER 95%, PR 95%, KI 6715%, HER-2 negative, T1BN0 stage Ia 2.  Adjuvant radiation therapy 06/02/2019- 3.  Follow-up adjuvant antiestrogen therapy with anastrozole  1 mg daily x5 years/BCI predictive for extended endocrine therapy benefit--unable to tolerate anastrozole  therefore plan is tamoxifen  x 10 years -------------------------------------------------------------------------------------------------------------------- Current treatment: Adjuvant antiestrogen therapy with anastrozole  1 mg daily started 07/09/2019 (discontinued due to severe joint aches and pains) switched to tamoxifen  10 mg started 01/11/2020 (planned for 10 years)   Tamoxifen  toxicities: Tolerating it extremely well--will continue taking this medication daily.    Breast cancer surveillance: Mammogram 04/10/2023: Benign, breast density category B Breast exam 09/14/2024: Benign

## 2024-09-10 ENCOUNTER — Other Ambulatory Visit: Payer: Self-pay | Admitting: *Deleted

## 2024-09-10 DIAGNOSIS — C50512 Malignant neoplasm of lower-outer quadrant of left female breast: Secondary | ICD-10-CM

## 2024-09-14 ENCOUNTER — Inpatient Hospital Stay: Payer: Medicare Other

## 2024-09-14 ENCOUNTER — Inpatient Hospital Stay: Payer: Medicare Other | Attending: Hematology and Oncology | Admitting: Hematology and Oncology

## 2024-09-14 VITALS — BP 141/59 | HR 68 | Temp 97.8°F | Resp 18 | Ht 68.0 in | Wt 242.0 lb

## 2024-09-14 DIAGNOSIS — C50512 Malignant neoplasm of lower-outer quadrant of left female breast: Secondary | ICD-10-CM | POA: Diagnosis not present

## 2024-09-14 DIAGNOSIS — Z170 Estrogen receptor positive status [ER+]: Secondary | ICD-10-CM | POA: Insufficient documentation

## 2024-09-14 DIAGNOSIS — Z79811 Long term (current) use of aromatase inhibitors: Secondary | ICD-10-CM | POA: Insufficient documentation

## 2024-09-14 DIAGNOSIS — Z1721 Progesterone receptor positive status: Secondary | ICD-10-CM | POA: Diagnosis not present

## 2024-09-14 DIAGNOSIS — D649 Anemia, unspecified: Secondary | ICD-10-CM | POA: Diagnosis not present

## 2024-09-14 DIAGNOSIS — D631 Anemia in chronic kidney disease: Secondary | ICD-10-CM | POA: Insufficient documentation

## 2024-09-14 DIAGNOSIS — Z79899 Other long term (current) drug therapy: Secondary | ICD-10-CM | POA: Insufficient documentation

## 2024-09-14 DIAGNOSIS — N1832 Chronic kidney disease, stage 3b: Secondary | ICD-10-CM | POA: Insufficient documentation

## 2024-09-14 LAB — CBC WITH DIFFERENTIAL (CANCER CENTER ONLY)
Abs Immature Granulocytes: 0.06 K/uL (ref 0.00–0.07)
Basophils Absolute: 0.1 K/uL (ref 0.0–0.1)
Basophils Relative: 1 %
Eosinophils Absolute: 0.4 K/uL (ref 0.0–0.5)
Eosinophils Relative: 4 %
HCT: 32.7 % — ABNORMAL LOW (ref 36.0–46.0)
Hemoglobin: 11.3 g/dL — ABNORMAL LOW (ref 12.0–15.0)
Immature Granulocytes: 1 %
Lymphocytes Relative: 30 %
Lymphs Abs: 2.7 K/uL (ref 0.7–4.0)
MCH: 34.3 pg — ABNORMAL HIGH (ref 26.0–34.0)
MCHC: 34.6 g/dL (ref 30.0–36.0)
MCV: 99.4 fL (ref 80.0–100.0)
Monocytes Absolute: 0.9 K/uL (ref 0.1–1.0)
Monocytes Relative: 10 %
Neutro Abs: 4.8 K/uL (ref 1.7–7.7)
Neutrophils Relative %: 54 %
Platelet Count: 310 K/uL (ref 150–400)
RBC: 3.29 MIL/uL — ABNORMAL LOW (ref 3.87–5.11)
RDW: 12.6 % (ref 11.5–15.5)
WBC Count: 8.8 K/uL (ref 4.0–10.5)
nRBC: 0 % (ref 0.0–0.2)

## 2024-09-14 NOTE — Progress Notes (Signed)
 Patient Care Team: Okey Carlin Redbird, MD as PCP - General (Family Medicine) Raford Riggs, MD as PCP - Cardiology (Cardiology) Odean Potts, MD as Consulting Physician (Hematology and Oncology) Dewey Rush, MD as Consulting Physician (Radiation Oncology) Curvin Deward MOULD, MD as Consulting Physician (General Surgery)  DIAGNOSIS:  Encounter Diagnoses  Name Primary?   Malignant neoplasm of lower-outer quadrant of left breast of female, estrogen receptor positive (HCC) Yes   Normocytic normochromic anemia     SUMMARY OF ONCOLOGIC HISTORY: Oncology History  Malignant neoplasm of lower-outer quadrant of left breast of female, estrogen receptor positive (HCC)  04/03/2019 Cancer Staging   Staging form: Breast, AJCC 8th Edition - Clinical stage from 04/03/2019: Stage IA (cT1a, cN0, cM0, G2, ER+, PR+, HER2-) - Signed by Crawford Morna Pickle, NP on 04/15/2019   04/15/2019 Initial Diagnosis   Screening mammogram detected 0.4cm mass in the left breast at the 6 o'clock position with no left axillary adenopathy. Biopsy on 04/03/19 showed IDC, grade 1-2, HER-2 - (0), ER +95%, PR+ 95%, Ki67 15%.    04/30/2019 Surgery   Left lumpectomy [facility]er): IDC, grade 1, 0.8cm, intermediate grade DCIS, lymphovascular invasion present, clear margins. Three left axillary lymph nodes negative for carcinoma.    06/02/2019 -  Radiation Therapy   Adjuvant radiation   08/2019 -  Anti-estrogen oral therapy   Anastrozole  daily stopped 10/2019 due to severe joint pain; switched to tamoxifen  10mg  on 01/11/20   10/06/2021 Genetic Testing   Negative hereditary cancer genetic testing: no pathogenic variants detected in [facility] CancerNext-Expanded +RNAinsight Panel.  Variants of uncertain significance detected in TSC1 at  p.L439V (c.1315C>G) and in TSC2 at p.D624N (c.1870G>A).  The report date is 10/06/2021.  The CancerNext-Expanded gene panel offered by [facility] and includes sequencing, rearrangement, and RNA analysis  for the following 77 genes: AIP, ALK, APC, ATM, AXIN2, BAP1, BARD1, BLM, BMPR1A, BRCA1, BRCA2, BRIP1, CDC73, CDH1, CDK4, CDKN1B, CDKN2A, CHEK2, CTNNA1, DICER1, FANCC, FH, FLCN, GALNT12, KIF1B, LZTR1, MAX, MEN1, MET, MLH1, MSH2, MSH3, MSH6, MUTYH, NBN, NF1, NF2, NTHL1, PALB2, PHOX2B, PMS2, POT1, PRKAR1A, PTCH1, PTEN, RAD51C, RAD51D, RB1, RECQL, RET, SDHA, SDHAF2, SDHB, SDHC, SDHD, SMAD4, SMARCA4, SMARCB1, SMARCE1, STK11, SUFU, TMEM127, TP53, TSC1, TSC2, VHL and XRCC2 (sequencing and deletion/duplication); EGFR, EGLN1, HOXB13, KIT, MITF, PDGFRA, POLD1, and POLE (sequencing only); EPCAM and GREM1 (deletion/duplication only).  UPDATE: TSC1 p.L439V VUS has been amended to Benign. Amended report date is 08/13/2022.     CHIEF COMPLIANT:   HISTORY OF PRESENT ILLNESS: Discussed the use of AI scribe software for clinical note transcription with the patient, who gave verbal consent to proceed.  History of Present Illness Karen Mills is a 68 year old female with breast cancer and anemia who presents for follow-up on her hemoglobin levels and hormone therapy.  Her hemoglobin is 11.3 g/dL, similar to September values and close to the 11.5 g/dL from her nephrologist. She has not needed anemia injections for over a year and maintains her levels with diet.  She has taken tamoxifen  since April 2021 after starting anastrozole  in October 2020, for a total of five years and two months of hormone therapy. She has fatigue that she thinks may be related to tamoxifen . Her breast cancer was grade 1, 0.8 cm, with lymphovascular invasion.     ALLERGIES:  is allergic to chlorthalidone, accupril [quinapril hcl], amlodipine besylate, atacand [candesartan], atorvastatin , cozaar [losartan potassium], hydrochlorothiazide-triamterene, tiazac [diltiazem hcl er beads], and zestril [lisinopril].  MEDICATIONS:  Current Outpatient Medications  Medication Sig  Dispense Refill   allopurinol  (ZYLOPRIM ) 100 MG tablet Take 200 mg  by mouth daily.     b complex vitamins capsule Take 1 capsule by mouth daily.     B Complex-C (B-COMPLEX WITH VITAMIN C) tablet Take 1 tablet by mouth daily.     Biotin  10000 MCG TABS Take 10,000 mcg by mouth every morning.     carvedilol  (COREG ) 12.5 MG tablet Take 1 tablet (12.5 mg total) by mouth 2 (two) times daily. 180 tablet 3   cholecalciferol  (VITAMIN D3) 25 MCG (1000 UT) tablet Take 1,000 Units by mouth every morning.     cloNIDine  (CATAPRES  - DOSED IN MG/24 HR) 0.2 mg/24hr patch Place 1 patch (0.2 mg total) onto the skin once a week. 12 patch 3   Collagen-Vitamin C-Biotin  (COLLAGEN PO) Take 1,000 mg by mouth daily.     furosemide  (LASIX ) 20 MG tablet Take 20 mg by mouth 2 (two) times daily. (Patient taking differently: Take 20 mg by mouth 2 (two) times daily. 2 in the morning and 2 in the evening.)     Krill Oil 1000 MG CAPS 1 capsule Orally once a day     levothyroxine  (SYNTHROID ) 100 MCG tablet Take 100 mcg by mouth every evening.     Multiple Vitamins-Minerals (HAIR FORMULA EXTRA STRENGTH PO) 4 tablets Orally once a day     NIFEdipine  (PROCARDIA -XL/NIFEDICAL-XL) 30 MG 24 hr tablet TAKE 1 TABLET BY MOUTH EVERY MORNING 90 tablet 3   rosuvastatin  (CRESTOR ) 20 MG tablet Take 1 tablet (20 mg total) by mouth daily. 90 tablet 3   tamoxifen  (NOLVADEX ) 10 MG tablet TAKE 1 TABLET BY MOUTH DAILY 90 tablet 3   valsartan  (DIOVAN ) 320 MG tablet Take 320 mg by mouth daily.     No current facility-administered medications for this visit.    PHYSICAL EXAMINATION: ECOG PERFORMANCE STATUS: 1 - Symptomatic but completely ambulatory  Vitals:   09/14/24 0818  BP: (!) 141/59  Pulse: 68  Resp: 18  Temp: 97.8 F (36.6 C)  SpO2: 99%   Filed Weights   09/14/24 0818  Weight: 242 lb (109.8 kg)    Physical Exam   (exam performed in the presence of a chaperone)  LABORATORY DATA:  I have reviewed the data as listed    Latest Ref Rng & Units 12/09/2023    8:40 AM 10/21/2023    5:09 AM  10/20/2023    3:06 PM  CMP  Glucose 70 - 99 mg/dL 890  895    BUN 8 - 23 mg/dL 18  20    Creatinine 9.55 - 1.00 mg/dL 8.92  8.71    Sodium 864 - 145 mmol/L 133  130  129   Potassium 3.5 - 5.1 mmol/L 4.1  4.6    Chloride 98 - 111 mmol/L 101  99    CO2 22 - 32 mmol/L 26  24    Calcium  8.9 - 10.3 mg/dL 9.2  9.3    Total Protein 6.5 - 8.1 g/dL 6.3     Total Bilirubin 0.0 - 1.2 mg/dL 0.7     Alkaline Phos 38 - 126 U/L 74     AST 15 - 41 U/L 22     ALT 0 - 44 U/L 17       Lab Results  Component Value Date   WBC 8.8 09/14/2024   HGB 11.3 (L) 09/14/2024   HCT 32.7 (L) 09/14/2024   MCV 99.4 09/14/2024   PLT 310 09/14/2024   NEUTROABS  4.8 09/14/2024    ASSESSMENT & PLAN:  Malignant neoplasm of lower-outer quadrant of left breast of female, estrogen receptor positive (HCC) 04/15/2019:Screening mammogram detected 0.4cm mass in the left breast at the 6 o'clock position with no left axillary adenopathy. Biopsy on 04/03/19 showed IDC, grade 1-2, HER-2 - (0), ER +95%, PR+ 95%, Ki67 15%.  T1 a N0 stage Ia clinical stage   Treatment plan: 1.  Breast conserving surgery with sentinel lymph node biopsy 04/30/2019: Grade 1 IDC 0.8 cm, 0/3 lymph nodes, ER 95%, PR 95%, KI 6715%, HER-2 negative, T1BN0 stage Ia 2.  Adjuvant radiation therapy 06/02/2019- 3.  Follow-up adjuvant antiestrogen therapy with anastrozole  1 mg daily x5 years/BCI predictive for extended endocrine therapy benefit--unable to tolerate anastrozole  therefore plan is tamoxifen  x 10 years -------------------------------------------------------------------------------------------------------------------- Current treatment: Adjuvant antiestrogen therapy with anastrozole  1 mg daily started 07/09/2019 (discontinued due to severe joint aches and pains) switched to tamoxifen  10 mg started 01/11/2020 (planned for 10 years)  Tamoxifen  duration discussion: We discussed the pros and cons of 10 years of tamoxifen  versus 5 years.  I believe that her risk  of recurrence is on the lower side if she were to continue with the tamoxifen  there would be 4% additional benefit from distant recurrence prevention.  Patient is contemplating on discontinuing after her current supply of medication is complete.   Tamoxifen  toxicities: Tolerating it extremely well   Breast cancer surveillance: Mammogram 04/10/2023: Benign, breast density category B Breast exam 09/14/2024: Benign  Normocytic normochromic anemia Hospitalization 10/18/2023-10/21/2023: Severe hyponatremia: Resolved with fluid restriction Lab review: 12/12/2022: Ferritin 193, iron saturation 13%, hemoglobin 9.5, creatinine 1.42 01/15/2023: Hemoglobin 9.4, MCV 98.3, platelets 317 02/12/2023: Hemoglobin 9.7 03/27/2023: Hemoglobin 10 05/07/2023: Hemoglobin 10.5 (proceed with today's injection) 05/28/2023: Hemoglobin 10.3-Retacrit  06/19/2023: Hemoglobin 10.4-Retacrit  06/15/2024: Hemoglobin 11.3 09/14/2024: Hemoglobin 11.3   Anemia of chronic kidney disease stage II-III. Current treatment: Retacrit  40,000 units every 3 weeks started 01/22/2023 (last injection was on 06/18/2023) Goal hemoglobin: 10.5 g Return to clinic in 1 year for follow-up      No orders of the defined types were placed in this encounter.  The patient has a good understanding of the overall plan. she agrees with it. she will call with any problems that may develop before the next visit here.  I personally spent a total of 30 minutes in the care of the patient today including preparing to see the patient, getting/reviewing separately obtained history, performing a medically appropriate exam/evaluation, counseling and educating, placing orders, referring and communicating with other health care professionals, documenting clinical information in the EHR, independently interpreting results, communicating results, and coordinating care.   Viinay K Jessalyn Hinojosa, MD 09/14/24

## 2024-09-16 ENCOUNTER — Ambulatory Visit: Admitting: Podiatry

## 2024-09-16 ENCOUNTER — Encounter: Payer: Self-pay | Admitting: Podiatry

## 2024-09-16 ENCOUNTER — Ambulatory Visit (INDEPENDENT_AMBULATORY_CARE_PROVIDER_SITE_OTHER)

## 2024-09-16 VITALS — Ht 68.0 in | Wt 242.0 lb

## 2024-09-16 DIAGNOSIS — S92352D Displaced fracture of fifth metatarsal bone, left foot, subsequent encounter for fracture with routine healing: Secondary | ICD-10-CM

## 2024-09-16 NOTE — Progress Notes (Signed)
 Chief Complaint  Patient presents with   Foot Injury    Pt is here to f/u on left foot after fracture, she states that the foot feels a lot better, still has some occasional pain.    HPI: 68 y.o. female presenting today for follow-up evaluation of Jones fracture left foot.  Doing well.  She is back in regular tennis shoes and essentially full activity  Brief history: Pain and tenderness associated to the left foot after a fall injury.  Fell at her home.  Denies LOC.  DOI: 06/21/2024.  Past Medical History:  Diagnosis Date   Breast cancer (HCC) 2020   Left Breast Cancer   CKD (chronic kidney disease)    Gout    Hyperlipidemia    Hypertension    Hypothyroidism    Megaloblastic anemia    Obesity    Personal history of radiation therapy 2020   Left Breast Cancer   PONV (postoperative nausea and vomiting)    SCC (squamous cell carcinoma) Keratoacanthoma 10/10/2016   Right Shin (Cx3,5FU)   Squamous cell carcinoma in situ (SCCIS) 11/15/2016   Right Lower Shin (tx p bx)   Varicose veins     Past Surgical History:  Procedure Laterality Date   ABLATION ON ENDOMETRIOSIS     BREAST LUMPECTOMY Left 04/30/2019   BREAST LUMPECTOMY WITH RADIOACTIVE SEED AND SENTINEL LYMPH NODE BIOPSY Left 04/30/2019   Procedure: LEFT BREAST LUMPECTOMY WITH RADIOACTIVE SEED AND SENTINEL LYMPH NODE BIOPSY;  Surgeon: Curvin Deward MOULD, MD;  Location: [facility];  Service: General;  Laterality: Left;   FOOT SURGERY     IR ANGIO EXTERNAL CAROTID SEL EXT CAROTID BILAT MOD SED  12/01/2021   IR ANGIO INTRA EXTRACRAN SEL COM CAROTID INNOMINATE BILAT MOD SED  12/01/2021   IR ANGIO VERTEBRAL SEL VERTEBRAL UNI R MOD SED  12/01/2021   IR ANGIOGRAM FOLLOW UP STUDY  12/01/2021   IR ANGIOGRAM FOLLOW UP STUDY  12/01/2021   IR ANGIOGRAM FOLLOW UP STUDY  12/01/2021   IR NEURO EACH ADD'L AFTER BASIC UNI LEFT (MS)  12/01/2021   IR NEURO EACH ADD'L AFTER BASIC UNI RIGHT (MS)  12/01/2021   IR NEURO EACH ADD'L AFTER  BASIC UNI RIGHT (MS)  12/01/2021   IR TRANSCATH/EMBOLIZ  12/01/2021   IR TRANSCATH/EMBOLIZ  12/01/2021   RADIOLOGY WITH ANESTHESIA N/A 12/01/2021   Procedure: RADIOLOGY WITH ANESTHESIA;  Surgeon: Dolphus Carrion, MD;  Location: [facility];  Service: Radiology;  Laterality: N/A;   TONSILLECTOMY     vein removal      Allergies  Allergen Reactions   Chlorthalidone Other (See Comments)    Severe Hyponatremia   Accupril [Quinapril Hcl] Cough   Amlodipine Besylate Swelling    Leg swelling   Atacand [Candesartan] Other (See Comments)    Unknown reaction   Atorvastatin  Other (See Comments)    Joint pain   Cozaar [Losartan Potassium] Other (See Comments)    Stomach upset    Hydrochlorothiazide-Triamterene Other (See Comments)    Unknown reaction  Other Reaction(s): not helpful   Tiazac [Diltiazem Hcl Er Beads] Other (See Comments)    Unknown reaction   Zestril [Lisinopril] Cough     Physical Exam: General: The patient is alert and oriented x3 in no acute distress.  Dermatology: Skin is warm, dry and supple bilateral lower extremities.   Vascular: Palpable pedal pulses bilaterally. Capillary refill within normal limits.  No appreciable edema.  No erythema.  Neurological: Grossly intact via light touch  Musculoskeletal Exam: No pedal deformities noted.  Ecchymosis with edema and tenderness around the lateral column of the left foot  Radiographic Exam LT foot 09/16/2024:  Continued improvement.  Osseous union and callus formation noted across the fracture site of the fifth metatarsal.  Continues to be in rectus alignment; nondisplaced.  Routine healing noted.  Assessment/Plan of Care: 1.  Jones fracture left fifth metatarsal, closed, nondisplaced, initial encounter.  DOI: 06/21/2024  -Patient evaluated.   - Patient is essentially full activity without any pain or symptoms and good supportive tennis shoes.  Continue -Patient would like to return in about 2 months for follow-up x-ray to  ensure that the fracture is healed completely although I do suspect it should heal uneventfully -Return to clinic 2 weeks follow-up x-ray left foot     Thresa EMERSON Sar, DPM [facility]  Dr. Thresa EMERSON Sar, DPM    [address] [place], [place] [address]                Office [phone]  Fax [phone]

## 2024-10-06 ENCOUNTER — Other Ambulatory Visit (HOSPITAL_BASED_OUTPATIENT_CLINIC_OR_DEPARTMENT_OTHER): Payer: Self-pay | Admitting: Cardiovascular Disease

## 2024-11-18 ENCOUNTER — Ambulatory Visit: Admitting: Podiatry

## 2024-11-26 ENCOUNTER — Encounter (HOSPITAL_BASED_OUTPATIENT_CLINIC_OR_DEPARTMENT_OTHER): Admitting: Family

## 2025-09-14 ENCOUNTER — Inpatient Hospital Stay

## 2025-09-14 ENCOUNTER — Inpatient Hospital Stay: Admitting: Hematology and Oncology
